# Patient Record
Sex: Female | Born: 1980
Health system: Southern US, Community
[De-identification: ages and names within clinical notes are randomized; demographics above are authoritative.]

## PROBLEM LIST (undated history)

## (undated) ENCOUNTER — Inpatient Hospital Stay (HOSPITAL_COMMUNITY): Payer: Self-pay

## (undated) DIAGNOSIS — R51 Headache: Secondary | ICD-10-CM

## (undated) DIAGNOSIS — R519 Headache, unspecified: Secondary | ICD-10-CM

## (undated) DIAGNOSIS — U071 COVID-19: Secondary | ICD-10-CM

## (undated) DIAGNOSIS — G4489 Other headache syndrome: Secondary | ICD-10-CM

## (undated) DIAGNOSIS — E119 Type 2 diabetes mellitus without complications: Secondary | ICD-10-CM

## (undated) DIAGNOSIS — J1282 Pneumonia due to coronavirus disease 2019: Secondary | ICD-10-CM

## (undated) DIAGNOSIS — G5603 Carpal tunnel syndrome, bilateral upper limbs: Secondary | ICD-10-CM

## (undated) HISTORY — PX: NO PAST SURGERIES: SHX2092

---
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
## 2002-11-06 ENCOUNTER — Inpatient Hospital Stay (HOSPITAL_COMMUNITY): Admission: AD | Admit: 2002-11-06 | Discharge: 2002-11-07 | Payer: Self-pay | Admitting: Obstetrics and Gynecology

## 2002-11-07 ENCOUNTER — Encounter: Payer: Self-pay | Admitting: Obstetrics and Gynecology

## 2002-11-07 ENCOUNTER — Encounter (INDEPENDENT_AMBULATORY_CARE_PROVIDER_SITE_OTHER): Payer: Self-pay | Admitting: Specialist

## 2003-03-02 ENCOUNTER — Encounter: Admission: RE | Admit: 2003-03-02 | Discharge: 2003-03-02 | Payer: Self-pay | Admitting: Obstetrics and Gynecology

## 2003-03-02 ENCOUNTER — Other Ambulatory Visit: Admission: RE | Admit: 2003-03-02 | Discharge: 2003-03-02 | Payer: Self-pay | Admitting: Family Medicine

## 2003-04-28 ENCOUNTER — Other Ambulatory Visit: Admission: RE | Admit: 2003-04-28 | Discharge: 2003-04-28 | Payer: Self-pay | Admitting: Family Medicine

## 2003-04-28 ENCOUNTER — Encounter: Admission: RE | Admit: 2003-04-28 | Discharge: 2003-04-28 | Payer: Self-pay | Admitting: Family Medicine

## 2003-04-28 ENCOUNTER — Encounter (INDEPENDENT_AMBULATORY_CARE_PROVIDER_SITE_OTHER): Payer: Self-pay

## 2003-05-12 ENCOUNTER — Encounter: Admission: RE | Admit: 2003-05-12 | Discharge: 2003-05-12 | Payer: Self-pay | Admitting: Family Medicine

## 2004-03-01 ENCOUNTER — Inpatient Hospital Stay (HOSPITAL_COMMUNITY): Admission: AD | Admit: 2004-03-01 | Discharge: 2004-03-05 | Payer: Self-pay | Admitting: Obstetrics

## 2004-07-26 ENCOUNTER — Inpatient Hospital Stay (HOSPITAL_COMMUNITY): Admission: AD | Admit: 2004-07-26 | Discharge: 2004-07-26 | Payer: Self-pay | Admitting: Obstetrics

## 2004-08-20 ENCOUNTER — Inpatient Hospital Stay (HOSPITAL_COMMUNITY): Admission: AD | Admit: 2004-08-20 | Discharge: 2004-08-22 | Payer: Self-pay | Admitting: Obstetrics

## 2005-09-26 ENCOUNTER — Ambulatory Visit: Payer: Self-pay | Admitting: Family Medicine

## 2006-02-10 ENCOUNTER — Inpatient Hospital Stay (HOSPITAL_COMMUNITY): Admission: AD | Admit: 2006-02-10 | Discharge: 2006-02-12 | Payer: Self-pay | Admitting: Obstetrics & Gynecology

## 2006-03-31 ENCOUNTER — Inpatient Hospital Stay (HOSPITAL_COMMUNITY): Admission: AD | Admit: 2006-03-31 | Discharge: 2006-04-03 | Payer: Self-pay | Admitting: Obstetrics

## 2006-05-16 ENCOUNTER — Inpatient Hospital Stay (HOSPITAL_COMMUNITY): Admission: AD | Admit: 2006-05-16 | Discharge: 2006-05-18 | Payer: Self-pay | Admitting: Obstetrics

## 2006-07-03 ENCOUNTER — Ambulatory Visit: Payer: Self-pay | Admitting: Family Medicine

## 2006-08-25 ENCOUNTER — Ambulatory Visit: Payer: Self-pay | Admitting: Family Medicine

## 2006-08-26 ENCOUNTER — Ambulatory Visit: Payer: Self-pay | Admitting: *Deleted

## 2006-08-28 ENCOUNTER — Ambulatory Visit (HOSPITAL_COMMUNITY): Admission: RE | Admit: 2006-08-28 | Discharge: 2006-08-28 | Payer: Self-pay | Admitting: Family Medicine

## 2006-10-02 ENCOUNTER — Ambulatory Visit: Payer: Self-pay | Admitting: Family Medicine

## 2007-09-01 ENCOUNTER — Encounter (INDEPENDENT_AMBULATORY_CARE_PROVIDER_SITE_OTHER): Payer: Self-pay | Admitting: *Deleted

## 2009-08-14 ENCOUNTER — Inpatient Hospital Stay (HOSPITAL_COMMUNITY): Admission: AD | Admit: 2009-08-14 | Discharge: 2009-08-16 | Payer: Self-pay | Admitting: Obstetrics

## 2011-03-21 LAB — CBC
MCHC: 32.1 g/dL (ref 30.0–36.0)
MCV: 74.8 fL — ABNORMAL LOW (ref 78.0–100.0)
Platelets: 212 10*3/uL (ref 150–400)
RDW: 18.9 % — ABNORMAL HIGH (ref 11.5–15.5)

## 2011-03-22 LAB — CBC
Platelets: 268 10*3/uL (ref 150–400)
WBC: 10.9 10*3/uL — ABNORMAL HIGH (ref 4.0–10.5)

## 2011-03-22 LAB — RPR: RPR Ser Ql: NONREACTIVE

## 2011-05-02 NOTE — Discharge Summary (Signed)
[name], [name]                 ACCOUNT NO.:  [id number]   MEDICAL RECORD NO.:  [id number]                   PATIENT TYPE:  INP   LOCATION:  9305                                 [facility]:  [facility]   PHYSICIAN:  Charles A. Clearance Coots, M.D.             DATE OF BIRTH:  [birth year]   DATE OF ADMISSION:  03/01/2004  DATE OF DISCHARGE:  03/05/2004                                 DISCHARGE SUMMARY   ADMITTING DIAGNOSIS:  Eleven weeks gestation, probable viral syndrome.   DISCHARGE DIAGNOSIS:  Eleven weeks gestation, probable viral syndrome,  status post bedrest and intravenous fluids and supportive management.  Discharged home much improved at [redacted] weeks gestation undelivered in good  condition.   REASON FOR ADMISSION:  A 30 year old Hispanic female G2 P0 at [redacted] weeks  gestation presents with nausea and vomiting for 2 days, could not keep  anything down.  She denied fever, chills, and dysuria.  The patient did have  some lower abdominal cramping.   PAST MEDICAL HISTORY:  Surgery:  None.  Illnesses:  None.   MEDICATIONS:  None.   ALLERGIES:  No known drug allergies.   SOCIAL HISTORY:  Negative for tobacco, alcohol, or recreational drug use.   PHYSICAL EXAMINATION:  GENERAL:  Slim Hispanic female in no acute distress.  VITAL SIGNS:  Temperature 100.8, pulse 98, respiratory rate 20, blood  pressure 108/62.  HEENT:  Normal.  LUNGS:  Clear to auscultation bilaterally.  HEART:  Regular rate and rhythm.  ABDOMEN:  Soft.  Positive suprapubic tenderness.  PELVIC:  Normal external female genitalia.  Cervix long, closed.  Positive  cervical motion tenderness.  Fetal heart rate with Doppler was 180 beats per  minute.   Urinalysis revealed greater than 80 ketones and specific gravity is 1.025.  Urine culture was sent.  Comprehensive metabolic panel was within normal  limits except for slightly elevated AST of 60.   HOSPITAL COURSE:  The patient was admitted, started on IV fluids and  antiemetic therapy.  She was also started on IV antibiotic therapy.  Urine  culture was sent and urine grew out 45,000 colonies of E. coli.  The patient  responded well to therapy and by hospital day #3 was feeling much better  with much less nausea and was able to begin to tolerate regular diet.  She  was tolerating regular diet quite well by hospital day #4 and had no  significant nausea, and was therefore discharged home.   DISCHARGE LABORATORY VALUES:  Urine culture 45,000 E. coli, sensitivity  pending.  Clinically responded quite well to Rocephin.   DISCHARGE DISPOSITION:  1. Medications:  Continue prenatal vitamins as tolerated.  2. Routine written instructions were given for undelivered obstetrical     patient.  3. The patient is to call the office for a follow-up appointment in 1 week.  Charles A. Clearance Coots, M.D.    CAH/MEDQ  D:  03/05/2004  T:  03/06/2004  Job:  [id number]

## 2011-05-02 NOTE — Discharge Summary (Signed)
[name], [name]             ACCOUNT NO.:  [id number]   MEDICAL RECORD NO.:  [id number]          PATIENT TYPE:  INP   LOCATION:  9158                          [facility]   PHYSICIAN:  Charles A. Clearance Coots, M.D.DATE OF BIRTH:  Apr 02, 1981   DATE OF ADMISSION:  03/31/2006  DATE OF DISCHARGE:  04/03/2006                                 DISCHARGE SUMMARY   ADMISSION DIAGNOSIS:  Thirty-two weeks gestation, preterm uterine  contractions.   DISCHARGE DIAGNOSES:  1.  Thirty-two weeks gestation, preterm uterine contractions improved  after intravenous magnesium sulfate tocolysis.  1.  Discharged undelivered at 32-weeks gestation in good condition.   REASON FOR ADMISSION:  A 30 year old Hispanic female G3, P2, estimated date  of confinement of May 26, 2006, presents with back pain and cramping. The  patient has a history of previous term and a previous preterm delivery.  Prenatal care was uncomplicated with this pregnancy.   PAST MEDICAL HISTORY:   PAST SURGICAL HISTORY:  None.   ILLNESSES:  None.   MEDICATIONS:  Prenatal vitamins.   ALLERGIES:  No known drug allergies.   SOCIAL HISTORY:  Married. Negative tobacco, alcohol or recreational drug  use.   PHYSICAL EXAMINATION:  Afebrile, vital signs were stable. Abdomen gravid,  nontender. Cervix 3 cm, 90% effaced and vertex at a  -2 station. External fetal monitor revealed uterine contractions every 3-7  minutes, mild.   ADMITTING LABORATORY VALUES:  Urinalysis was within normal limits except for  a specific gravity of greater than 1.030, 40 ketones.   HOSPITAL COURSE:  The patient was started on magnesium sulfate  intravenously. Responded well to therapy and was having no significant  uterine contractions after 24 hours. Magnesium sulfate was discontinued  after 48 hours and she was discharged home the following morning much  improved, undelivered at [redacted] weeks gestation in good condition.   DISCHARGE MEDICATIONS:  Continue  prenatal vitamins.   DISPOSITION:  Routine written instructions were given for discharge after  admission for preterm labor. The patient is to call the office for a follow  up appointment in 29 week.      Charles A. Clearance Coots, M.D.  Electronically Signed     CAH/MEDQ  D:  04/03/2006  T:  04/03/2006  Job:  [id number]
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
## 2011-05-02 NOTE — Discharge Summary (Signed)
[name], [name]                 ACCOUNT NO.:  [id number]   MEDICAL RECORD NO.:  [id number]                   PATIENT TYPE:  INP   LOCATION:  9157                                 [facility]   PHYSICIAN:  Zenaida Niece, M.D.             DATE OF BIRTH:  1981/05/07   DATE OF ADMISSION:  11/06/2002  DATE OF DISCHARGE:  11/07/2002                                 DISCHARGE SUMMARY   ADMISSION DIAGNOSIS:  Intrauterine pregnancy at 19-20 weeks with advanced  preterm labor.   DISCHARGE DIAGNOSIS:  Intrauterine pregnancy at 19-20 weeks with advanced  preterm labor.   PROCEDURE:  On November 07, 2002, she had a spontaneous vaginal delivery.   COMPLICATIONS:  Fetal death.   HISTORY OF PRESENT ILLNESS:  This is a 30 year old Hispanic female gravida  1, para 0 with an EGA of 19-20 weeks by an LMP of June 19, 2002, who  presented to [facility] ED with the complaint of bleeding and pain.  Evaluation there revealed a significant bulging bag of water with stable  vitals and she was transferred to [facility] for evaluation.  She speaks  Spanish with very little [place]. She had no prenatal care but did know she  was pregnant and had no problems up until now.  She denies any significant  past medical history.   PHYSICAL EXAMINATION:  VITAL SIGNS:  She is afebrile with stable vital  signs.  She did have fetal heart rate of 180 at [facility] and by the  monitor she does have contractions every three to four minutes.  ABDOMEN:  The abdomen has a fundus at the umbilicus which is slightly  tender.  PELVIC:  Speculum reveals some blood and a bulging bag of water.  Gonorrhea  and Chlamydia cultures are done.  On bimanual exam, there is no palpable  cervix with a bulging bag of water with a questionable presenting part.   LABORATORY DATA:  Admission labs at [facility] revealed a white count of 15.9,  hemoglobin 13.2, platelets 253,000.  A CMP revealed a low potassium of 2.6  and slightly  elevated LFTs with an SGOT of 38 and SGPT of 49.   HOSPITAL COURSE:  The patient was admitted and had an ultrasound performed  which confirmed an estimated gestational age of around 20 weeks and an  estimated fetal weight of 300 g.  The situation was discussed with the  patient via an interpreter and we agreed to proceed with delivery.  Amniotomy was performed at 0120 on November 07, 2002.  She progressed to  complete and delivered at a fetus at approximately 0218 on November 07, 2002.  This was a female with Apgars of 1 and 1 and had a heart beat for  approximately 11 minutes.  The patient did hold the baby.  The baby appeared  grossly normal, and was sent to the morgue.  The patient declined autopsy.  The placenta and remaining cord delivered at  approximately 0335 with no  significant bleeding and no lacerations.  She was monitored for the next  several hours and remained afebrile with stable vital signs.  Repeat labs  revealed a normal CMP with normal potassium and normal liver enzymes.  The  CBC revealed a white count up to 18 and hemoglobin of 11.2.  She was felt to  be stable enough for discharge home to be followed up in two weeks.   DISCHARGE INSTRUCTIONS:  Pelvic rest.  Follow up in two weeks.  Call for any  significant problems.                                               Zenaida Niece, M.D.   TDM/MEDQ  D:  11/07/2002  T:  11/07/2002  Job:  [id number]

## 2011-10-24 ENCOUNTER — Emergency Department (HOSPITAL_COMMUNITY)
Admission: EM | Admit: 2011-10-24 | Discharge: 2011-10-24 | Disposition: A | Discharge status: Home / Self Care | Payer: Self-pay | Attending: Emergency Medicine | Admitting: Emergency Medicine

## 2011-10-24 ENCOUNTER — Encounter: Payer: Self-pay | Admitting: Emergency Medicine

## 2011-10-24 DIAGNOSIS — K089 Disorder of teeth and supporting structures, unspecified: Secondary | ICD-10-CM | POA: Insufficient documentation

## 2011-10-24 DIAGNOSIS — K029 Dental caries, unspecified: Secondary | ICD-10-CM | POA: Insufficient documentation

## 2011-10-24 DIAGNOSIS — H5789 Other specified disorders of eye and adnexa: Secondary | ICD-10-CM | POA: Insufficient documentation

## 2011-10-24 DIAGNOSIS — K1370 Unspecified lesions of oral mucosa: Secondary | ICD-10-CM | POA: Insufficient documentation

## 2011-10-24 MED ORDER — PENICILLIN V POTASSIUM 500 MG PO TABS: 500.0000 mg | ORAL_TABLET | Freq: Four times a day (QID) | ORAL | Status: AC

## 2011-10-24 MED ORDER — HYDROCODONE-ACETAMINOPHEN 5-500 MG PO TABS: 2.0000 | ORAL_TABLET | Freq: Four times a day (QID) | ORAL | Status: AC | PRN

## 2011-10-24 NOTE — ED Notes (Signed)
Patient with left lower molar pain.

## 2011-10-24 NOTE — ED Provider Notes (Addendum)
History     CSN: [id number] Arrival date & time: 10/24/2011  1:57 AM   First MD Initiated Contact with Patient 10/24/11 0220      Chief Complaint  Patient presents with  . Dental Pain    (Consider location/radiation/quality/duration/timing/severity/associated sxs/prior treatment) Patient is a 30 y.o. female presenting with tooth pain. The history is provided by the patient. No language interpreter was used.  Dental PainThe primary symptoms include oral lesions. Primary symptoms do not include mouth pain, dental injury, oral bleeding, headaches, fever, shortness of breath, sore throat, angioedema or cough. The symptoms began 3 to 5 days ago. The symptoms are worsening. The symptoms are recurrent. The symptoms occur constantly.  Additional symptoms include: dental sensitivity to temperature. Additional symptoms do not include: gum swelling, gum tenderness, purulent gums, trismus, jaw pain, facial swelling, trouble swallowing, pain with swallowing, excessive salivation, dry mouth, taste disturbance, smell disturbance, drooling, ear pain, hearing loss, nosebleeds, swollen glands, goiter and fatigue. Medical issues do not include: alcohol problem, chewing tobacco and periodontal disease.    History reviewed. No pertinent past medical history.  History reviewed. No pertinent past surgical history.  No family history on file.  History  Substance Use Topics  . Smoking status: Current Everyday Smoker  . Smokeless tobacco: Not on file  . Alcohol Use: No    OB History    Grav Para Term Preterm Abortions TAB SAB Ect Mult Living                  Review of Systems  Constitutional: Negative for fever and fatigue.  HENT: Negative for hearing loss, ear pain, nosebleeds, sore throat, facial swelling, drooling and trouble swallowing.   Eyes: Positive for discharge.  Respiratory: Negative for cough and shortness of breath.   Cardiovascular: Negative.   Gastrointestinal: Negative.     Genitourinary: Negative for difficulty urinating.  Musculoskeletal: Negative.   Neurological: Negative for headaches.  Hematological: Negative.   Psychiatric/Behavioral: Negative.     Allergies  Review of patient's allergies indicates no known allergies.  Home Medications  No current outpatient prescriptions on file.  BP 126/82  Pulse 85  Temp(Src) 99.3 F (37.4 C) (Oral)  Resp 18  SpO2 100%  Physical Exam  Constitutional: She is oriented to person, place, and time. She appears well-developed and well-nourished. No distress.  HENT:  Head: Normocephalic and atraumatic.  Mouth/Throat: Oropharynx is clear and moist.       Widespread dental decay erosion of the RL first molar filling  Eyes: EOM are normal. Right eye exhibits no discharge. Left eye exhibits no discharge.  Neck: Neck supple.  Cardiovascular: Normal rate and regular rhythm.   Pulmonary/Chest: Effort normal and breath sounds normal.  Abdominal: Soft. Bowel sounds are normal.  Neurological: She is alert and oriented to person, place, and time.  Skin: Skin is warm and dry.  Psychiatric: She has a normal mood and affect.    ED Course  Procedures (including critical care time)  Labs Reviewed - No data to display No results found.   No diagnosis found.    MDM  Patient instructed to use condoms while on antibiotics and for a full cycle thereafter to prevent pregancny as penicillins can decreased OCP effectiveness.  Patient verbalizes understanding        Talayla Doyel K Nishaan Stanke-Rasch, MD 10/24/11 0241  Xara Paulding K Elexia Friedt-Rasch, MD 10/24/11 [phone]

## 2011-10-24 NOTE — ED Notes (Signed)
PT. REPORTS RIGHT LOWER MOLAR PAIN RADIATING TO RIGHT EAR AND FACE x4 DAYS.

## 2013-08-23 ENCOUNTER — Ambulatory Visit: Payer: Self-pay

## 2013-08-25 ENCOUNTER — Ambulatory Visit: Payer: Self-pay | Attending: Internal Medicine

## 2013-08-31 ENCOUNTER — Ambulatory Visit: Payer: Self-pay

## 2014-05-03 ENCOUNTER — Ambulatory Visit: Payer: Self-pay

## 2014-05-11 DIAGNOSIS — D259 Leiomyoma of uterus, unspecified: Secondary | ICD-10-CM

## 2014-05-11 NOTE — Progress Notes (Signed)
Patient referred to [place] for fibroids and bleeding. Per Dr. Harolyn Rutherford patient needs ultrasound and clinic appointment to discuss bleeding. U/S scheduled for 05/19/14 at 1015. Called patient with interpreter Raquel. Informed patient of the need for U/S prior to appointment as well as appointment date, time and location. Advised patient she should arrive 15 minutes early and have a full bladder. Number to radiology scheduling given [phone] incase patient needs to reschedule. Informed patient she will be hearing from our front office staff about an appointment here in the clinic. Patient verbalized understanding. No questions or concerns.

## 2014-05-19 ENCOUNTER — Ambulatory Visit (HOSPITAL_COMMUNITY)
Admission: RE | Admit: 2014-05-19 | Discharge: 2014-05-19 | Disposition: A | Discharge status: Home / Self Care | Payer: Self-pay | Source: Ambulatory Visit | Attending: Obstetrics & Gynecology | Admitting: Obstetrics & Gynecology

## 2014-05-19 DIAGNOSIS — D259 Leiomyoma of uterus, unspecified: Secondary | ICD-10-CM

## 2014-05-19 DIAGNOSIS — N946 Dysmenorrhea, unspecified: Secondary | ICD-10-CM | POA: Insufficient documentation

## 2014-05-19 DIAGNOSIS — D251 Intramural leiomyoma of uterus: Secondary | ICD-10-CM | POA: Insufficient documentation

## 2014-05-24 ENCOUNTER — Telehealth: Payer: Self-pay

## 2014-05-24 NOTE — Telephone Encounter (Signed)
Message copied by Geanie Logan on Wed May 24, 2014 11:54 AM ------      Message from: Verita Schneiders A      Created: Mon May 22, 2014  3:28 PM       Ultrasound shows very small fibroid.  Will discuss management of her pain during her visit. Speaks Spanish only.  Please call to inform patient of results and recommendations.       ------

## 2014-05-24 NOTE — Telephone Encounter (Signed)
Called patient with pacific interpreter [phone]. Called home phone-- busy signal. Called mobile number-- was told by interpreter the system froze-- unable to dial out again. Unable to leave message.

## 2014-05-25 NOTE — Telephone Encounter (Signed)
Called patient and informed of results. Appointment has not been scheduled--- informed patient she will be hearing from front office staff with appointment within the next few days. Patient verbalized understanding. No questions or concerns.

## 2014-07-14 ENCOUNTER — Ambulatory Visit (INDEPENDENT_AMBULATORY_CARE_PROVIDER_SITE_OTHER): Payer: Self-pay | Admitting: Nurse Practitioner

## 2014-07-14 ENCOUNTER — Encounter: Payer: Self-pay | Admitting: Nurse Practitioner

## 2014-07-14 VITALS — BP 125/64 | HR 80 | Wt 139.5 lb

## 2014-07-14 DIAGNOSIS — N949 Unspecified condition associated with female genital organs and menstrual cycle: Secondary | ICD-10-CM

## 2014-07-14 DIAGNOSIS — N938 Other specified abnormal uterine and vaginal bleeding: Secondary | ICD-10-CM

## 2014-07-14 DIAGNOSIS — D259 Leiomyoma of uterus, unspecified: Secondary | ICD-10-CM

## 2014-07-14 MED ORDER — FLUCONAZOLE 150 MG PO TABS: 150.0000 mg | ORAL_TABLET | Freq: Every day | ORAL | Status: DC

## 2014-07-14 MED ORDER — NORGESTIMATE-ETH ESTRADIOL 0.25-35 MG-MCG PO TABS: 1.0000 | ORAL_TABLET | Freq: Every day | ORAL | Status: DC

## 2014-07-14 NOTE — Progress Notes (Signed)
History:  [name] 33 y.o. G4P3  who presents to clinic today for vaginal bleeding that has now stopped. She was bleeding from January to March and was put on Depo Provera. A pelvic ultrasound was ordered prior to being seen that showed 2 small fibroids. She is Spanish speaking only with an interpreter and the story in somewhat convoluted. It sounds like she is also seen at [facility] and had a negative pap in April. She has also been placed on BCps for 1-2 months that may or may not have helped. She would like to become pregnant one more time. And she thinks she has a yeast infection today.   The following portions of the patient's history were reviewed and updated as appropriate: allergies, current medications, past family history, past medical history, past social history, past surgical history and problem list.  Review of Systems:    Objective:  Physical Exam BP 125/64  Pulse 80  Wt 139 lb 8 oz (63.277 kg)  LMP 07/12/2014 GENERAL: Well-developed, well-nourished female in no acute distress.  HEENT: Normocephalic, atraumatic.  ABDOMEN: Soft, nontender, nondistended. No organomegaly. Normal bowel sounds appreciated in all quadrants.  PELVIC: Normal external female genitalia. Vagina is pink and rugated. . Normal cervix contour Uterus is normal in size. No adnexal mass or tenderness. Small amount old blood only. Wet Prep obtained EXTREMITIES: No cyanosis, clubbing, or edema, 2+ distal pulses.   Labs and Imaging No results found.   Assessment & Plan:  Assessment:  Uterine Fibroids  DUB that seems to be improving Possible vaginal yeast infection  Plans:  Will add back Ortho Cyclen BCP until she wants to become pregnant Diflucan 150 mg/ 1 refill Follow up with [facility] for Depo Provera and yearly pap smears  Olegario Messier, NP 07/14/2014 8:50 AM

## 2014-07-14 NOTE — Patient Instructions (Signed)
Hemorragia uterina disfuncional (Dysfunctional Uterine Bleeding) Normalmente, los perodos menstruales comienzan en las jvenes de entre 11 y 62 aos. Un ciclo o perodo menstrual puede repetirse [employer] [address] y los 60 das y dura [place] 1 y 77 das. [employer] 12 y los 14 das antes de que comience el ciclo menstrual, se produce la ovulacin (los ovarios producen vulos). Cuando cuente los periodos, hgalo desde Engineer, manufacturing systems del comienzo de la hemorragia del perodo anterior Management consultant de la hemorragia del perodo siguiente. La hemorragia uterina disfuncional (anormal) es diferente del perodo menstrual normal. Los perodos pueden comenzar antes o despus que lo habitual. Pueden ser menos abundantes, presentar cogulos, o ser ms abundantes. Puede tener [employer] perodos o IT consultant un perodo o ms. Puede tener hemorragia luego de [employer], despus de la menopausia o faltarle el perodo menstrual. CAUSAS  Embarazo (normal, aborto espontneo, embarazo ectpico).  DIU (dispositivo intrauterino, anticonceptivo)  Pldoras anticonceptivas  Tratamiento hormonal  La menopausia  Infeccin en el cervix.  Problemas de coagulacin.  Infecciones de la superficie interna del tero.  Endometriosis: la superficie interna del tero se desarrolla en la pelvis y otros rganos femeninos.  Adherencias (tejido cicatrizal) dentro del tero.  Obesidad o severa prdida de peso.  Plipos en el tero.  Cncer en el crvix, la vagina o el tero.  Quiste de ovarios o Sndrome ovrico poliqustico.  Otras enfermedades (diabetes, enfermedad de la tiroides, Social research officer, government).  Fibromas en el tero (tumores no cancerosos).  Problemas con las hormonas femeninas.  Hiperplasia del endometrio: capa gruesa y clulas agrandadas dentro del tero.  Medicamentos que interfieren con la ovulacin.  Radiacin en la pelvis o el abdomen.  Quimioterapia. DIAGNSTICO  El mdico  Marketing executive historia de sus perodos [place], los medicamentos que toma, los cambios en el peso corporal, el estrs de la vida diaria y cualquier problema mdico que tenga.  El mdico realizar un examen fsico, incluyendo un examen plvico.  Tambin le solicitar anlisis para completar el diagnstico. Ellos son:  Papanicolau.  Anlisis de [place].  Cultivos para descartar infecciones.  Tomografa computada.  Ultrasonido.  Histeroscopa.  Laparoscopa.  Resonancia magntica.  Histerosalpingografa.  Dilatacin y curetaje.  Biopsia de endometrio. TRATAMIENTO El tratamiento depender de la causa de la hemorragia.. El tratamiento consiste en:  Observacin de los perodos menstruales durante algunos meses.  Prescripcin de medicamentos como:  Antibiticos:  Hormonas.  Pldoras anticonceptivas  Retirar el DIU (dispositivo intrauterino, anticonceptivo).  Ciruga:  Dilatacin y curetaje (raspado y remocin de tejido de la zona interna del tero).  Laparoscopa (examen del interior del abdomen con un tubo con luz).  Ablacin uterina (destruccin de la membrana que cubre el tero con corriente elctrica, rayos lser, congelamiento o calor ).  Histeroscopa (examen del cuello y el tero con un tubo con luz).  Histerectoma (extirpacin del tero). INSTRUCCIONES PARA EL CUIDADO DOMICILIARIO  Si el profesional que la asiste le prescribe medicamentos, tmelos tal como se le indic. No cambie ni reemplace medicamentos sin consultarlo con Writer.  Las hemorragias de larga duracin pueden traen como consecuencia un dficit de hierro. El profesional que lo asiste podr Conservation officer, nature. Esto ayuda a Air cabin crew que el organismo pierde luego de una hemorragia abundante. Tome los medicamentos tal como se le indic.  No tome aspirina o medicamentos que la contengan u desde una semana antes del perodo menstrual ni durante el mismo. La aspirina puede hacer  que la hemorragia empeore.  Si necesita cambiar el apsito o el tampn mas de una vez cada 2 horas, permanezca en cama con los pies elevados y aplique una compresa fra en la zona baja del abdomen. Descanse todo lo que pueda hasta que la hemorragia se detenga.  Consuma alimentos balanceados. Coma alimentos ricos en hierro. Por ejemplo:  Vegetales verdes frescos.  Cereales integrales y cereales y panes con salvado.  Huevos.  Carnes.  Hgado.  No trate de perder peso hasta que la hemorragia anormal se detenga y los niveles de hierro en la sangre vuelvan a la normalidad. No levante pesos de ms de 10 libras (4.5 kg.) ni realice actividades extenuantes mientras tenga la hemorragia.  Durante un par de meses tome nota en un almanaque y [employer] comienzo y el fin de su perodo menstrual y el tipo de sangrado (escaso, [place], [place], gotas, cogulos o falta de perodo). El objetivo es que su mdico evale mejor el problema. SOLICITE ATENCIN MDICA SI:  Debe cambiar el apsito o el tampn ms de una vez cada hora.  Se siente mareada o dbil.  Tiene algn problema que pueda relacionarse con el medicamento que est tomando.  Siente dolor.  Desea retirar su DIU.  Quiere suspender o cambiar sus pldoras anticonceptivas u hormonas.  Tiene algn tipo de hemorragia anormal mencionada anteriormente.  Tiene ms de 19 aos y an no ha tenido su perodo menstrual.  Tiene 60 aos y an tiene Chartered certified accountant.  Tiene alguno de los sntomas mencionados anteriormente.  Aparece una erupcin cutnea. SOLICITE ATENCIN MDICA DE INMEDIATO SI:  La temperatura oral se eleva sin motivo por encima de 38,9 C (102 F).  Comienza a sentir escalofros.  Debe cambiar el apsito o el tampn ms de una vez cada hora.  Siente dolor abdominal.  Pierde el conocimiento, se desmaya. Document Released: 09/10/2005 Document Revised: 08/25/2012 [facility] Patient Information 2015 [place]. This  information is not intended to replace advice given to you by your health care provider. Make sure you discuss any questions you have with your health care provider.

## 2014-07-14 NOTE — Addendum Note (Signed)
Addended by: Novella Olive on: 07/14/2014 10:03 AM   Modules accepted: Orders

## 2014-07-15 LAB — WET PREP, GENITAL
Clue Cells Wet Prep HPF POC: NONE SEEN
Trich, Wet Prep: NONE SEEN
WBC WET PREP: NONE SEEN

## 2014-08-05 ENCOUNTER — Emergency Department (HOSPITAL_COMMUNITY)
Admission: EM | Admit: 2014-08-05 | Discharge: 2014-08-05 | Disposition: A | Discharge status: Home / Self Care | Payer: Self-pay | Attending: Emergency Medicine | Admitting: Emergency Medicine

## 2014-08-05 ENCOUNTER — Encounter (HOSPITAL_COMMUNITY): Payer: Self-pay | Admitting: Emergency Medicine

## 2014-08-05 DIAGNOSIS — Z79899 Other long term (current) drug therapy: Secondary | ICD-10-CM | POA: Insufficient documentation

## 2014-08-05 DIAGNOSIS — Z87891 Personal history of nicotine dependence: Secondary | ICD-10-CM | POA: Insufficient documentation

## 2014-08-05 DIAGNOSIS — R51 Headache: Secondary | ICD-10-CM | POA: Insufficient documentation

## 2014-08-05 DIAGNOSIS — G43809 Other migraine, not intractable, without status migrainosus: Secondary | ICD-10-CM

## 2014-08-05 HISTORY — DX: Headache: R51

## 2014-08-05 HISTORY — DX: Headache, unspecified: R51.9

## 2014-08-05 MED ORDER — SUMATRIPTAN SUCCINATE 25 MG PO TABS: 25.0000 mg | ORAL_TABLET | ORAL | Status: DC | PRN

## 2014-08-05 MED ORDER — OXYCODONE-ACETAMINOPHEN 5-325 MG PO TABS
1.0000 | ORAL_TABLET | Freq: Once | ORAL | Status: AC
  Administered 2014-08-05: 1 via ORAL
  Filled 2014-08-05: qty 1

## 2014-08-05 MED ORDER — ONDANSETRON 4 MG PO TBDP
8.0000 mg | ORAL_TABLET | Freq: Once | ORAL | Status: AC
  Administered 2014-08-05: 8 mg via ORAL
  Filled 2014-08-05: qty 2

## 2014-08-05 MED ORDER — DIPHENHYDRAMINE HCL 50 MG/ML IJ SOLN
12.5000 mg | Freq: Once | INTRAMUSCULAR | Status: AC
  Administered 2014-08-05: 12.5 mg via INTRAVENOUS
  Filled 2014-08-05: qty 1

## 2014-08-05 MED ORDER — PROCHLORPERAZINE EDISYLATE 5 MG/ML IJ SOLN
10.0000 mg | Freq: Four times a day (QID) | INTRAMUSCULAR | Status: DC | PRN
  Administered 2014-08-05: 10 mg via INTRAVENOUS
  Filled 2014-08-05: qty 2

## 2014-08-05 MED ORDER — KETOROLAC TROMETHAMINE 30 MG/ML IJ SOLN
15.0000 mg | Freq: Once | INTRAMUSCULAR | Status: AC
  Administered 2014-08-05: 15 mg via INTRAVENOUS
  Filled 2014-08-05: qty 1

## 2014-08-05 NOTE — ED Notes (Signed)
Ambulated to br without difficulty.

## 2014-08-05 NOTE — ED Notes (Signed)
C/o headache x 3 days with nausea.  Reports history of headaches.

## 2014-08-05 NOTE — ED Provider Notes (Signed)
CSN: [id number]     Arrival date & time 08/05/14  2409 History   First MD Initiated Contact with Patient 08/05/14 0703     Chief Complaint  Patient presents with  . Headache     Patient is a 33 y.o. female presenting with headaches. The history is provided by the patient. The history is limited by a language barrier.  Headache Pain location:  Generalized Quality:  Dull Onset quality:  Gradual Timing:  Constant Progression:  Unchanged Context: bright light and loud noise   Relieved by:  Nothing Associated symptoms: nausea     Past Medical History  Diagnosis Date  . Headache    History reviewed. No pertinent past surgical history. No family history on file. History  Substance Use Topics  . Smoking status: Former Research scientist (life sciences)  . Smokeless tobacco: Not on file  . Alcohol Use: No   OB History   Grav Para Term Preterm Abortions TAB SAB Ect Mult Living                 Review of Systems  Gastrointestinal: Positive for nausea.  Neurological: Positive for headaches.      Allergies  Review of patient's allergies indicates no known allergies.  Home Medications   Prior to Admission medications   Medication Sig Start Date End Date Taking? Authorizing Provider  SUMAtriptan (IMITREX) 25 MG tablet Take 1 tablet (25 mg total) by mouth every 2 (two) hours as needed for migraine or headache. May repeat in 2 hours if headache persists or recurs. No more than 3 tabs in 24 hour period 08/05/14   Dot Lanes, MD   BP 109/68  Pulse 75  Temp(Src) 98.3 F (36.8 C) (Oral)  Resp 18  Ht 5\' 1"  (1.549 m)  Wt 140 lb (63.504 kg)  BMI 26.47 kg/m2  SpO2 99%  LMP 04/26/2014 Physical Exam Physical Exam  Nursing note and vitals reviewed. Constitutional: She is oriented to person, place, and time. She appears well-developed and well-nourished. No distress.  HENT:  Head: Normocephalic and atraumatic.  Eyes: Pupils are equal, round, and reactive to light.  Neck: Normal range of motion.   Cardiovascular: Normal rate and intact distal pulses.   Pulmonary/Chest: No respiratory distress.  Abdominal: Normal appearance. She exhibits no distension.  Musculoskeletal: Normal range of motion.  Neurological: She is alert and oriented to person, place, and time. No cranial nerve deficit.  no focal or lateralizing weakness.  Gait is normal. Skin: Skin is warm and dry. No rash noted.  Psychiatric: She has a normal mood and affect. Her behavior is normal.   ED Course  Procedures (including critical care time) Medications  prochlorperazine (COMPAZINE) injection 10 mg (10 mg Intravenous Given 08/05/14 0749)  oxyCODONE-acetaminophen (PERCOCET/ROXICET) 5-325 MG per tablet 1 tablet (1 tablet Oral Given 08/05/14 0335)  ondansetron (ZOFRAN-ODT) disintegrating tablet 8 mg (8 mg Oral Given 08/05/14 0335)  diphenhydrAMINE (BENADRYL) injection 12.5 mg (12.5 mg Intravenous Given 08/05/14 0749)  ketorolac (TORADOL) 30 MG/ML injection 15 mg (15 mg Intravenous Given 08/05/14 0850)  diphenhydrAMINE (BENADRYL) injection 12.5 mg (12.5 mg Intravenous Given 08/05/14 0854)   After treatment in the ED the patient feels back to baseline and wants to go home. Labs Review Labs Reviewed - No data to display  Imaging Review No results found.    MDM   Final diagnoses:  Other migraine without status migrainosus, not intractable        Dot Lanes, MD 08/05/14 1013

## 2014-08-05 NOTE — Discharge Instructions (Signed)
Cefalea migraosa (Migraine Headache) Una cefalea migraosa es un dolor muy intenso y punzante en uno o ambos lados de la cabeza. Hable con su mdico [employer] factores que pueden causar Medical illustrator) las Psychologist, occupational. Parole solo los [employer] segn le haya indicado el mdico.  Cuando tenga la migraa, acustese en un cuarto oscuro y tranquilo  Lleve un registro diario para averiguar si hay ciertas cosas que le provocan la cefalea migraosa. Por ejemplo, escriba:  Lo que usted come y bebe.  Cunto tiempo duerme.  Algn cambio en su dieta o en los medicamentos.  Beba menos alcohol.  Si fuma, deje de hacerlo.  Duerma lo suficiente.  Disminuya todo tipo de estrs de la vida diaria.  [place] luces tenues si le [employer] luces brillantes o hacen que la [place]. SOLICITE AYUDA DE INMEDIATO SI:   La migraa empeora.  Tiene fiebre.  Presenta rigidez en el cuello.  Tiene dificultad para ver.  Sus msculos estn dbiles, o pierde el control muscular.  Pierde el equilibrio o tiene problemas para Writer.  Siente que se desvanece (debilidad) o se desmaya.  Tiene malos sntomas que son diferentes a los primeros sntomas. ASEGRESE DE QUE:   Comprende estas instrucciones.  Controlar su afeccin.  Recibir ayuda de inmediato si no mejora o si empeora. Document Released: 02/27/2009 Document Revised: 12/06/2013 [facility] Patient Information 2015 [place]. This information is not intended to replace advice given to you by your health care provider. Make sure you discuss any questions you have with your health care provider.

## 2014-08-10 ENCOUNTER — Encounter (HOSPITAL_COMMUNITY): Payer: Self-pay | Admitting: Emergency Medicine

## 2014-08-10 ENCOUNTER — Emergency Department (HOSPITAL_COMMUNITY): Payer: Medicaid Other

## 2014-08-10 ENCOUNTER — Inpatient Hospital Stay (HOSPITAL_COMMUNITY)
Admission: EM | Admit: 2014-08-10 | Discharge: 2014-08-14 | DRG: 872 | Disposition: A | Discharge status: Home / Self Care | Payer: Medicaid Other | Attending: Internal Medicine | Admitting: Internal Medicine

## 2014-08-10 DIAGNOSIS — N12 Tubulo-interstitial nephritis, not specified as acute or chronic: Secondary | ICD-10-CM | POA: Diagnosis present

## 2014-08-10 DIAGNOSIS — E860 Dehydration: Secondary | ICD-10-CM | POA: Diagnosis present

## 2014-08-10 DIAGNOSIS — E872 Acidosis, unspecified: Secondary | ICD-10-CM

## 2014-08-10 DIAGNOSIS — N10 Acute tubulo-interstitial nephritis: Secondary | ICD-10-CM | POA: Diagnosis present

## 2014-08-10 DIAGNOSIS — R112 Nausea with vomiting, unspecified: Secondary | ICD-10-CM | POA: Diagnosis present

## 2014-08-10 DIAGNOSIS — R6510 Systemic inflammatory response syndrome (SIRS) of non-infectious origin without acute organ dysfunction: Secondary | ICD-10-CM

## 2014-08-10 DIAGNOSIS — R7309 Other abnormal glucose: Secondary | ICD-10-CM

## 2014-08-10 DIAGNOSIS — N134 Hydroureter: Secondary | ICD-10-CM

## 2014-08-10 DIAGNOSIS — N1330 Unspecified hydronephrosis: Secondary | ICD-10-CM | POA: Diagnosis not present

## 2014-08-10 DIAGNOSIS — E876 Hypokalemia: Secondary | ICD-10-CM | POA: Diagnosis present

## 2014-08-10 DIAGNOSIS — A419 Sepsis, unspecified organism: Secondary | ICD-10-CM | POA: Diagnosis not present

## 2014-08-10 DIAGNOSIS — D259 Leiomyoma of uterus, unspecified: Secondary | ICD-10-CM

## 2014-08-10 DIAGNOSIS — R739 Hyperglycemia, unspecified: Secondary | ICD-10-CM | POA: Diagnosis present

## 2014-08-10 DIAGNOSIS — Z87891 Personal history of nicotine dependence: Secondary | ICD-10-CM

## 2014-08-10 DIAGNOSIS — E119 Type 2 diabetes mellitus without complications: Secondary | ICD-10-CM | POA: Diagnosis present

## 2014-08-10 DIAGNOSIS — N938 Other specified abnormal uterine and vaginal bleeding: Secondary | ICD-10-CM

## 2014-08-10 LAB — URINE MICROSCOPIC-ADD ON

## 2014-08-10 LAB — URINALYSIS, ROUTINE W REFLEX MICROSCOPIC
BILIRUBIN URINE: NEGATIVE
KETONES UR: 15 mg/dL — AB
Leukocytes, UA: NEGATIVE
Nitrite: NEGATIVE
PH: 7 (ref 5.0–8.0)
PROTEIN: 100 mg/dL — AB
Specific Gravity, Urine: 1.046 — ABNORMAL HIGH (ref 1.005–1.030)
Urobilinogen, UA: >8 mg/dL — ABNORMAL HIGH (ref 0.0–1.0)

## 2014-08-10 LAB — COMPREHENSIVE METABOLIC PANEL
ALBUMIN: 3.3 g/dL — AB (ref 3.5–5.2)
ALK PHOS: 187 U/L — AB (ref 39–117)
ALT: 38 U/L — ABNORMAL HIGH (ref 0–35)
ALT: 36 U/L — AB (ref 0–35)
ANION GAP: 20 — AB (ref 5–15)
ANION GAP: 19 — AB (ref 5–15)
AST: 55 U/L — ABNORMAL HIGH (ref 0–37)
AST: 45 U/L — ABNORMAL HIGH (ref 0–37)
Albumin: 2.8 g/dL — ABNORMAL LOW (ref 3.5–5.2)
Alkaline Phosphatase: 165 U/L — ABNORMAL HIGH (ref 39–117)
BILIRUBIN TOTAL: 0.7 mg/dL (ref 0.3–1.2)
BUN: 7 mg/dL (ref 6–23)
BUN: 7 mg/dL (ref 6–23)
CALCIUM: 8.4 mg/dL (ref 8.4–10.5)
CHLORIDE: 98 meq/L (ref 96–112)
CO2: 17 meq/L — AB (ref 19–32)
CO2: 19 meq/L (ref 19–32)
Calcium: 9.2 mg/dL (ref 8.4–10.5)
Chloride: 103 mEq/L (ref 96–112)
Creatinine, Ser: 0.51 mg/dL (ref 0.50–1.10)
Creatinine, Ser: 0.56 mg/dL (ref 0.50–1.10)
GFR calc Af Amer: >90 mL/min (ref >90)
GLUCOSE: 246 mg/dL — AB (ref 70–99)
Glucose, Bld: 337 mg/dL — ABNORMAL HIGH (ref 70–99)
POTASSIUM: 3.9 meq/L (ref 3.7–5.3)
Potassium: 2.8 mEq/L — CL (ref 3.7–5.3)
SODIUM: 141 meq/L (ref 137–147)
Sodium: 135 mEq/L — ABNORMAL LOW (ref 137–147)
TOTAL PROTEIN: 7.2 g/dL (ref 6.0–8.3)
Total Bilirubin: 0.8 mg/dL (ref 0.3–1.2)
Total Protein: 8.3 g/dL (ref 6.0–8.3)

## 2014-08-10 LAB — CBC
HEMATOCRIT: 42.6 % (ref 36.0–46.0)
HEMOGLOBIN: 15 g/dL (ref 12.0–15.0)
MCH: 30.4 pg (ref 26.0–34.0)
MCHC: 35.2 g/dL (ref 30.0–36.0)
MCV: 86.4 fL (ref 78.0–100.0)
Platelets: 251 10*3/uL (ref 150–400)
RBC: 4.93 MIL/uL (ref 3.87–5.11)
RDW: 12.6 % (ref 11.5–15.5)
WBC: 11.8 10*3/uL — AB (ref 4.0–10.5)

## 2014-08-10 LAB — BASIC METABOLIC PANEL
ANION GAP: 19 — AB (ref 5–15)
BUN: 6 mg/dL (ref 6–23)
CHLORIDE: 100 meq/L (ref 96–112)
CO2: 19 mEq/L (ref 19–32)
Calcium: 8.6 mg/dL (ref 8.4–10.5)
Creatinine, Ser: 0.46 mg/dL — ABNORMAL LOW (ref 0.50–1.10)
GFR calc non Af Amer: >90 mL/min (ref >90)
Glucose, Bld: 228 mg/dL — ABNORMAL HIGH (ref 70–99)
POTASSIUM: 3.2 meq/L — AB (ref 3.7–5.3)
SODIUM: 138 meq/L (ref 137–147)

## 2014-08-10 LAB — HEMOGLOBIN A1C
HEMOGLOBIN A1C: 11.6 % — AB (ref <5.7)
MEAN PLASMA GLUCOSE: 286 mg/dL — AB (ref <117)

## 2014-08-10 LAB — LIPASE, BLOOD: LIPASE: 12 U/L (ref 11–59)

## 2014-08-10 LAB — CBG MONITORING, ED: Glucose-Capillary: 217 mg/dL — ABNORMAL HIGH (ref 70–99)

## 2014-08-10 LAB — POC URINE PREG, ED: Preg Test, Ur: NEGATIVE

## 2014-08-10 LAB — MAGNESIUM: Magnesium: 1.8 mg/dL (ref 1.5–2.5)

## 2014-08-10 LAB — GLUCOSE, CAPILLARY: GLUCOSE-CAPILLARY: 210 mg/dL — AB (ref 70–99)

## 2014-08-10 MED ORDER — MORPHINE SULFATE 4 MG/ML IJ SOLN
4.0000 mg | Freq: Once | INTRAMUSCULAR | Status: AC
  Administered 2014-08-10: 4 mg via INTRAVENOUS
  Filled 2014-08-10: qty 1

## 2014-08-10 MED ORDER — POTASSIUM CHLORIDE CRYS ER 20 MEQ PO TBCR
40.0000 meq | EXTENDED_RELEASE_TABLET | Freq: Once | ORAL | Status: AC
  Administered 2014-08-10: 40 meq via ORAL
  Filled 2014-08-10: qty 2

## 2014-08-10 MED ORDER — ENOXAPARIN SODIUM 40 MG/0.4ML ~~LOC~~ SOLN
40.0000 mg | SUBCUTANEOUS | Status: DC
  Administered 2014-08-10 – 2014-08-13 (×4): 40 mg via SUBCUTANEOUS
  Filled 2014-08-10 (×5): qty 0.4

## 2014-08-10 MED ORDER — ONDANSETRON HCL 4 MG PO TABS: 4.0000 mg | ORAL_TABLET | Freq: Four times a day (QID) | ORAL | Status: DC | PRN

## 2014-08-10 MED ORDER — ONDANSETRON HCL 4 MG/2ML IJ SOLN
4.0000 mg | Freq: Once | INTRAMUSCULAR | Status: AC
  Administered 2014-08-10: 4 mg via INTRAVENOUS
  Filled 2014-08-10: qty 2

## 2014-08-10 MED ORDER — DEXTROSE 5 % IV SOLN
1.0000 g | INTRAVENOUS | Status: DC
  Administered 2014-08-10 – 2014-08-13 (×4): 1 g via INTRAVENOUS
  Filled 2014-08-10 (×5): qty 10

## 2014-08-10 MED ORDER — HYDROCODONE-ACETAMINOPHEN 5-325 MG PO TABS
1.0000 | ORAL_TABLET | ORAL | Status: DC | PRN
  Administered 2014-08-11 (×4): 2 via ORAL
  Administered 2014-08-12: 1 via ORAL
  Administered 2014-08-12: 2 via ORAL
  Administered 2014-08-12: 1 via ORAL
  Filled 2014-08-10: qty 2
  Filled 2014-08-10 (×2): qty 1
  Filled 2014-08-10 (×4): qty 2

## 2014-08-10 MED ORDER — POTASSIUM CHLORIDE 10 MEQ/100ML IV SOLN
10.0000 meq | Freq: Once | INTRAVENOUS | Status: AC
  Administered 2014-08-10: 10 meq via INTRAVENOUS
  Filled 2014-08-10: qty 100

## 2014-08-10 MED ORDER — ACETAMINOPHEN 650 MG RE SUPP: 650.0000 mg | Freq: Four times a day (QID) | RECTAL | Status: DC | PRN

## 2014-08-10 MED ORDER — ONDANSETRON HCL 4 MG/2ML IJ SOLN
4.0000 mg | Freq: Four times a day (QID) | INTRAMUSCULAR | Status: DC | PRN
  Administered 2014-08-11 – 2014-08-12 (×2): 4 mg via INTRAVENOUS
  Filled 2014-08-10 (×2): qty 2

## 2014-08-10 MED ORDER — INSULIN ASPART 100 UNIT/ML ~~LOC~~ SOLN
0.0000 [IU] | Freq: Three times a day (TID) | SUBCUTANEOUS | Status: DC
  Administered 2014-08-11 (×3): 3 [IU] via SUBCUTANEOUS
  Administered 2014-08-12: 2 [IU] via SUBCUTANEOUS
  Administered 2014-08-12: 1 [IU] via SUBCUTANEOUS
  Administered 2014-08-12: 2 [IU] via SUBCUTANEOUS
  Administered 2014-08-13 (×2): 1 [IU] via SUBCUTANEOUS

## 2014-08-10 MED ORDER — ALBUTEROL SULFATE (2.5 MG/3ML) 0.083% IN NEBU: 2.5000 mg | INHALATION_SOLUTION | RESPIRATORY_TRACT | Status: DC | PRN

## 2014-08-10 MED ORDER — HYDROMORPHONE HCL PF 1 MG/ML IJ SOLN
1.0000 mg | Freq: Once | INTRAMUSCULAR | Status: AC
  Administered 2014-08-10: 1 mg via INTRAVENOUS
  Filled 2014-08-10: qty 1

## 2014-08-10 MED ORDER — POTASSIUM CHLORIDE IN NACL 40-0.9 MEQ/L-% IV SOLN
INTRAVENOUS | Status: AC
  Administered 2014-08-10: 125 mL/h via INTRAVENOUS
  Filled 2014-08-10 (×3): qty 1000

## 2014-08-10 MED ORDER — SODIUM CHLORIDE 0.9 % IV BOLUS (SEPSIS)
1000.0000 mL | Freq: Once | INTRAVENOUS | Status: AC
  Administered 2014-08-10: 1000 mL via INTRAVENOUS

## 2014-08-10 MED ORDER — ACETAMINOPHEN 325 MG PO TABS
650.0000 mg | ORAL_TABLET | Freq: Four times a day (QID) | ORAL | Status: DC | PRN
  Administered 2014-08-10 – 2014-08-12 (×3): 650 mg via ORAL
  Filled 2014-08-10 (×4): qty 2

## 2014-08-10 MED ORDER — HYDROMORPHONE HCL PF 1 MG/ML IJ SOLN
1.0000 mg | INTRAMUSCULAR | Status: DC | PRN
  Administered 2014-08-11 (×2): 1 mg via INTRAVENOUS
  Filled 2014-08-10 (×2): qty 1

## 2014-08-10 MED ORDER — SODIUM CHLORIDE 0.9 % IJ SOLN
3.0000 mL | Freq: Two times a day (BID) | INTRAMUSCULAR | Status: DC
  Administered 2014-08-11: 3 mL via INTRAVENOUS

## 2014-08-10 NOTE — Progress Notes (Signed)
RECEIVED FROM ED TO 5W02, FAMILY AT BEDSIDE, DENIES NAUSEA, C/O ABD BACK PAIN  8/10, INSTRUCTED ON CALL SYSTEM AND ROOM SURROUNDINGS

## 2014-08-10 NOTE — H&P (Addendum)
History and Physical  [name]:[id number] DOB: 1981/04/19 DOA: 08/10/2014  Referring physician: Michele Mcalpine, ED PA. PCP: No primary provider on file.  Outpatient Specialists:  1. None  Chief Complaint: Right-sided abdominal pain.  HPI: [name] is a 33 y.o. female with no known past medical history, presents to the ED with right-sided abdominal pain. Patient is Spanish-speaking but speaks fair amount of English to provide adequate history. She states that she was in her usual state of health until 4 days ago when she started experiencing headache and generalized body aches. This was associated with chills but no fevers. She denied any other symptoms at that time. She woke up this morning at approximately 5 AM with acute onset of right-sided abdominal pain which was radiating to the back, rated as 10/10 in severity, made worse with movement and slightly better on lying still. This was associated with 3 episodes of nausea and mostly nonbloody emesis except during the last episode she noticed a streak of blood. She denies diarrhea and had last BM 4 days ago. She states that her urine output was low this morning but has improved in the ED after IV fluid resuscitation. She denies hematuria or dysuria. She denies prior history of renal stones. LMP was 04/26/2014 and she is on contraception. In the ED, patient noted to have low-grade fever, tachycardia, potassium 2.8, glucose 337 mg per DL, mild transaminitis, WBC 11.8 and CT abdomen and pelvis with contrast showing right moderate hydronephrosis and hydroureter without definite evidence of an obstructing stone and mild increased density in the perinephric fat which may reflect inflammation. No acute hepatobiliary nonacute bowel abnormality noted. The uterus and adnexal structures exhibits no acute abnormalities. Hospitalist admission requested.   Review of Systems: All systems reviewed and apart from history of presenting illness, are  negative.  Past Medical History  Diagnosis Date  . Headache    History reviewed. No pertinent past surgical history. Social History:  reports that she has quit smoking. She does not have any smokeless tobacco history on file. She reports that she does not drink alcohol or use illicit drugs. married. Independent of activities of daily living.   No Known Allergies  History reviewed. No pertinent family history.  patient denies family history.   Prior to Admission medications   Medication Sig Start Date End Date Taking? Authorizing Provider  ibuprofen (ADVIL,MOTRIN) 200 MG tablet Take 400 mg by mouth every 6 (six) hours as needed for headache or moderate pain.   Yes Historical Provider, MD  medroxyPROGESTERone (DEPO-PROVERA) 150 MG/ML injection Inject 150 mg into the muscle every 3 (three) months.   Yes Historical Provider, MD   Physical Exam: Filed Vitals:   08/10/14 1300 08/10/14 1342 08/10/14 1400 08/10/14 1524  BP: 110/65 111/67 114/70 116/68  Pulse: 107  112   Temp:      TempSrc:      Resp: 24 19 17 16   SpO2: 95% 99% 97% 100%   MAXIMUM TEMPERATURE 99.78F    General exam: Moderately built and nourished  young female patient, lying uncomfortably supine on the gurney in no obvious distress. Mild intermittent painful distress.   Head, eyes and ENT: Nontraumatic and normocephalic. Pupils equally reacting to light and accommodation. Oral mucosa  dry.  Neck: Supple. No JVD, carotid bruit or thyromegaly.  Lymphatics: No lymphadenopathy.  Respiratory system: Clear to auscultation. No increased work of breathing.  Cardiovascular system: S1 and S2 heard, RRR. No JVD, murmurs, gallops, clicks or  pedal edema.  Gastrointestinal system: Abdomen is nondistended, soft. Tender right upper quadrant and right mid quadrant without guarding, rigidity or rebound. Normal bowel sounds heard. No organomegaly or masses appreciated. No renal angle tenderness.   Central nervous system: Alert and  oriented. No focal neurological deficits.  Extremities: Symmetric 5 x 5 power. Peripheral pulses symmetrically felt.   Skin: No rashes or acute findings.  Musculoskeletal system: Negative exam.  Psychiatry: Pleasant and cooperative.   Labs on Admission:  Basic Metabolic Panel:  Recent Labs Lab 08/10/14 1024 08/10/14 1313  NA 135* 141  K 3.9 2.8*  CL 98 103  CO2 17* 19  GLUCOSE 337* 246*  BUN 7 7  CREATININE 0.51 0.56  CALCIUM 9.2 8.4   Liver Function Tests:  Recent Labs Lab 08/10/14 1024 08/10/14 1313  AST 55* 45*  ALT 38* 36*  ALKPHOS 187* 165*  BILITOT 0.7 0.8  PROT 8.3 7.2  ALBUMIN 3.3* 2.8*   No results found for this basename: LIPASE, AMYLASE,  in the last 168 hours No results found for this basename: AMMONIA,  in the last 168 hours CBC:  Recent Labs Lab 08/10/14 1024  WBC 11.8*  HGB 15.0  HCT 42.6  MCV 86.4  PLT 251   Cardiac Enzymes: No results found for this basename: CKTOTAL, CKMB, CKMBINDEX, TROPONINI,  in the last 168 hours  BNP (last 3 results) No results found for this basename: PROBNP,  in the last 8760 hours CBG: No results found for this basename: GLUCAP,  in the last 168 hours  Radiological Exams on Admission: Ct Abdomen Pelvis Wo Contrast  08/10/2014   CLINICAL DATA:  Right flank and lower quadrant pain with vomiting  EXAM: CT ABDOMEN AND PELVIS WITHOUT CONTRAST  TECHNIQUE: Multidetector CT imaging of the abdomen and pelvis was performed following the standard protocol without IV contrast.  COMPARISON:  None.  FINDINGS: There is moderate right-sided hydronephrosis and hydroureter. Without obvious etiology. There is a calcification on image 82 of series 2 that likely reflects a phlebolith. The right kidney is enlarged. There is a sub mm midpole calcification as well as lower pole calcification. The left kidney is normal. The perinephric fat on the right is mildly increased.  The liver, gallbladder, spleen, pancreas, nondistended stomach,  adrenal glands, and abdominal aorta are normal. There are few normal sized pericaval lymph nodes. The small and large bowel exhibit no evidence of ileus nor of obstruction. The appendix is not discretely demonstrated. No pericecal inflammatory changes are demonstrated. The uterus is vertically oriented. It exhibits normal density. There is soft tissue fullness in the right adnexal region. There is no free pelvic fluid. There is no inguinal nor significant umbilical hernia.  The lung bases exhibit subsegmental atelectasis posteriorly. The lumbar spine and bony pelvis are unremarkable.  IMPRESSION: 1. There is enlargement of the right kidney with moderate hydronephrosis and hydroureter without definite evidence of an obstructing stone. Mildly increased density in the perinephric fat may reflect inflammation. Urologic evaluation is recommended. 2. There is no acute hepatobiliary nor acute bowel abnormality. 3. The uterus and adnexal structures exhibit no acute abnormalities.   Electronically Signed   By: David  Martinique   On: 08/10/2014 12:13    EKG: Independently reviewed.  Sinus tachycardia at 112 beats per minute without acute abnormalities.   Assessment/Plan Principal Problem:   Hydronephrosis Active Problems:   Hypokalemia   Nausea and vomiting   Dehydration   Pyelonephritis, acute   Hyperglycemia   1.  Abdominal  pain secondary to right hydronephrosis/hydroureter: No obstructing stone seen on CT. Urology consulted and await input. She will require some form of decompression procedure-cystoscopy/percutaneous nephrostomy. Etiology unclear-no abdominal or pelvic masses appreciated, no h/o prior abdominal surgery for adhesions or urological procedures for strictures as etiology. Pain management.  2.  Possible acute right pyelonephritis: Although patient has no dysuria and only rare bacteria seen on UA, CT suggests mild increased density in the perinephric fat. She also has low-grade fever and mild  leukocytosis. Start IV Rocephin pending blood and urine culture results. 3. Nausea and vomiting: Secondary to problem #1 and 2. Downgraded to clear liquid diet, IV fluids and when necessary IV antiemetics. 4. Dehydration: IV fluids. 5. Hypokalemia: Patient is status post by mouth potassium 40 meq. x1 and IV 10 meq x1. Continue potassium in IV fluids. Recheck BMP and magnesium and replace as needed. 6. Hyperglycemia: Does not carry history of DM. Check A1c and place on SSI. Aggressive IV fluid hydration. 7. Abnormal LFTs: Unclear etiology. Denies alcohol intake. No acute hepatobiliary issues on CT. Follow LFTs in a.m. Check lipase.    Code Status: Full   Family Communication:  discussed with spouse at bedside.   Disposition Plan:  home when medically stable.    Time spent:  65 minutes   Rosalynd Mcwright, MD, FACP, FHM. [facility] Pager [phone]  If 7PM-7AM, please contact night-coverage [email or web address] Password [facility] 08/10/2014, 4:56 PM

## 2014-08-10 NOTE — Progress Notes (Signed)
Received report from ED RN.

## 2014-08-10 NOTE — ED Provider Notes (Signed)
Medical screening examination/treatment/procedure(s) were performed by non-physician practitioner and as supervising physician I was immediately available for consultation/collaboration.   EKG Interpretation None        Ernestina Patches, MD 08/10/14 1709

## 2014-08-10 NOTE — Progress Notes (Signed)
  CARE MANAGEMENT ED NOTE 08/10/2014  Patient:  [name], [name]   Account Number:  [id number]  Date Initiated:  08/10/2014  Documentation initiated by:  Edwyna Shell  Subjective/Objective Assessment:   33 yo female presenting to the ED with c/o abdominal pain     Subjective/Objective Assessment Detail:     Action/Plan:   Patient is to reestablish care at the [facility] upon discharge in unable to connect with Clinic while patient in ED. CM will follow.   Action/Plan Detail:   Anticipated DC Date:       Status Recommendation to Physician:   Result of Recommendation:  Agreed    DC Planning Services  CM consult  PCP issues    Choice offered to / List presented to:            Status of service:  In process, will continue to follow  ED Comments:   ED Comments Detail:  CM consulted to assist patient with establishing care with a PCP. This CM spoke with ht patient reagrding follow up with a PCP. She stated [facility] and has been a patient at the [facility] in the past and has had an ornage card. This CM provided the patient with a pamphlet for the [facility]. This CM l/m with the [facility] to set up an appointment for the patient with a PCP and with the fiancial counselor for the orange card. The patient was encouraged to follow up at the [facility] upon discharge to establish care if this CM has not had a return call to establish care. This CM contact information has been provided to the patient for follow up assist post discharge to assist in establishing care with a provider. Patient verbalized understanding and no other questions or concerns at this time.

## 2014-08-10 NOTE — Progress Notes (Signed)
Called [phone] to get ED report and was told that RN busy and not answering her phone.

## 2014-08-10 NOTE — ED Notes (Addendum)
Pt with RLQ pain that radiates to R flank, with vomiting and chills since Monday 8/24. Decreased appetite.

## 2014-08-10 NOTE — ED Provider Notes (Signed)
CSN: [id number]     Arrival date & time 08/10/14  4332 History  This chart was scribed for non-physician practitioner, Michele Mcalpine, PA-C, working with Ernestina Patches, MD, by Delphia Grates, ED Scribe. This patient was seen in room E41C/E41C and the patient's care was started at 10:09 AM.     Chief Complaint  Patient presents with  . Abdominal Pain  . Emesis      The history is provided by the patient. No language interpreter was used.    HPI Comments: [name] is a 33 y.o. female who presents to the Emergency Department complaining of sudden onset, constant, severe RLQ abdominal pain onset this morning. Patient states the pain woke her from sleep and reports it radiates to her right flank. No aggravating or alleviating factors. There is associated generalized body aches, chills, subjective fever (triage temp 99.1 F), and decreased appetite. States she had nausea and an episode of vomiting earlier this morning. When she tried to urinate earlier today she only had a small amount comes out. She denies hematuria, dysuria, increased urinary frequency, urgency or diarrhea. Last bowel movement was 4 days ago. Patient denies history of kidney stones. No history of abdominal surgeries.   Past Medical History  Diagnosis Date  . Headache    History reviewed. No pertinent past surgical history. History reviewed. No pertinent family history. History  Substance Use Topics  . Smoking status: Former Research scientist (life sciences)  . Smokeless tobacco: Not on file  . Alcohol Use: No   OB History   Grav Para Term Preterm Abortions TAB SAB Ect Mult Living                 Review of Systems  Constitutional: Positive for chills and appetite change.  Gastrointestinal: Positive for nausea, vomiting and abdominal pain. Negative for diarrhea.  Genitourinary: Negative for hematuria.  All other systems reviewed and are negative.     Allergies  Review of patient's allergies indicates no known  allergies.  Home Medications   Prior to Admission medications   Medication Sig Start Date End Date Taking? Authorizing Provider  SUMAtriptan (IMITREX) 25 MG tablet Take 1 tablet (25 mg total) by mouth every 2 (two) hours as needed for migraine or headache. May repeat in 2 hours if headache persists or recurs. No more than 3 tabs in 24 hour period 08/05/14   Dot Lanes, MD   Triage Vitals: BP 101/73  Temp(Src) 99.1 F (37.3 C) (Oral)  Resp 20  SpO2 100%  LMP 04/26/2014  Physical Exam  Nursing note and vitals reviewed. Constitutional: She is oriented to person, place, and time. She appears well-developed and well-nourished. No distress.  Appears uncomfortable but in no apparent distress.  HENT:  Head: Normocephalic and atraumatic.  Mouth/Throat: Oropharynx is clear and moist.  Eyes: Conjunctivae and EOM are normal.  Neck: Normal range of motion. Neck supple.  Cardiovascular: Normal rate, regular rhythm and normal heart sounds.   Pulmonary/Chest: Effort normal and breath sounds normal. No respiratory distress.  Abdominal: Normal appearance. She exhibits no distension. There is tenderness in the left lower quadrant. There is guarding and CVA tenderness (right). There is no rigidity and no rebound.  No peritoneal signs.  Musculoskeletal: Normal range of motion. She exhibits no edema.  Neurological: She is alert and oriented to person, place, and time. No sensory deficit.  Skin: Skin is warm and dry. She is not diaphoretic.  Psychiatric: She has a normal mood and affect. Her behavior is normal.  ED Course  Procedures (including critical care time)  DIAGNOSTIC STUDIES: Oxygen Saturation is 100% on room air, normal by my interpretation.    COORDINATION OF CARE: At 0626 Discussed treatment plan with patient which includes CT scan and labs. Patient agrees.   Labs Review Labs Reviewed  CBC - Abnormal; Notable for the following:    WBC 11.8 (*)    All other components within  normal limits  COMPREHENSIVE METABOLIC PANEL - Abnormal; Notable for the following:    Sodium 135 (*)    CO2 17 (*)    Glucose, Bld 337 (*)    Albumin 3.3 (*)    AST 55 (*)    ALT 38 (*)    Alkaline Phosphatase 187 (*)    Anion gap 20 (*)    All other components within normal limits  URINALYSIS, ROUTINE W REFLEX MICROSCOPIC - Abnormal; Notable for the following:    Color, Urine AMBER (*)    APPearance CLOUDY (*)    Specific Gravity, Urine 1.046 (*)    Glucose, UA >1000 (*)    Hgb urine dipstick TRACE (*)    Ketones, ur 15 (*)    Protein, ur 100 (*)    Urobilinogen, UA >8.0 (*)    All other components within normal limits  URINE MICROSCOPIC-ADD ON - Abnormal; Notable for the following:    Squamous Epithelial / LPF MANY (*)    All other components within normal limits  POC URINE PREG, ED    Imaging Review Ct Abdomen Pelvis Wo Contrast  08/10/2014   CLINICAL DATA:  Right flank and lower quadrant pain with vomiting  EXAM: CT ABDOMEN AND PELVIS WITHOUT CONTRAST  TECHNIQUE: Multidetector CT imaging of the abdomen and pelvis was performed following the standard protocol without IV contrast.  COMPARISON:  None.  FINDINGS: There is moderate right-sided hydronephrosis and hydroureter. Without obvious etiology. There is a calcification on image 82 of series 2 that likely reflects a phlebolith. The right kidney is enlarged. There is a sub mm midpole calcification as well as lower pole calcification. The left kidney is normal. The perinephric fat on the right is mildly increased.  The liver, gallbladder, spleen, pancreas, nondistended stomach, adrenal glands, and abdominal aorta are normal. There are few normal sized pericaval lymph nodes. The small and large bowel exhibit no evidence of ileus nor of obstruction. The appendix is not discretely demonstrated. No pericecal inflammatory changes are demonstrated. The uterus is vertically oriented. It exhibits normal density. There is soft tissue fullness  in the right adnexal region. There is no free pelvic fluid. There is no inguinal nor significant umbilical hernia.  The lung bases exhibit subsegmental atelectasis posteriorly. The lumbar spine and bony pelvis are unremarkable.  IMPRESSION: 1. There is enlargement of the right kidney with moderate hydronephrosis and hydroureter without definite evidence of an obstructing stone. Mildly increased density in the perinephric fat may reflect inflammation. Urologic evaluation is recommended. 2. There is no acute hepatobiliary nor acute bowel abnormality. 3. The uterus and adnexal structures exhibit no acute abnormalities.   Electronically Signed   By: David  Martinique   On: 08/10/2014 12:13     EKG Interpretation None      MDM   Final diagnoses:  Hydronephrosis, unspecified hydronephrosis type  Hydroureter  Lactic acidosis  Hyperglycemia  Hypokalemia    Patient presenting with sudden onset right lower quadrant pain radiating towards her right flank with associated body aches, subjective fever and chills beginning a few days ago. She  is nauseated and has a decreased appetite. Given right lower cautery tenderness, flank pain and right-sided CVA tenderness, suspicion for renal stone greater than appendicitis. Plan to obtain labs, UA, CT without contrast. 12:41 PM CT scan showing enlargement of the right kidney with moderate hydronephrosis and hydroureter with evidence of an obstructing stone. The patient may have passed a kidney stone. Urine negative for infection, trace amount of blood seen. She has a large amount of glucose in her urine with 15 ketones. Glucose 337 on CMP. Anion gap of 20. Patient states she does not have a history of diabetes and over the past 2 years when she had her sugar checked it was normal. She does not have a PCP at this time. Liver functions elevated, however slight hemolysis noted. Liver and gallbladder seen on CT scan without any acute findings. She is not tender in the right  upper quadrant. Plan to give IV fluids and recheck metabolic panel. Will also consult case management to help patient establish care with a PCP. 3:00 PM K 2.8. Potassium will be replaced PO and IV. Case discussed with attending Dr. Tawnya Crook who agrees with plan of care.  4:31 PM Pt admitted to triad. I spoke with Dr. Janice Norrie (urology) regarding CT who will consult patient on admission.  I personally performed the services described in this documentation, which was scribed in my presence. The recorded information has been reviewed and is accurate.   Illene Labrador, PA-C 08/10/14 [phone]

## 2014-08-10 NOTE — ED Notes (Signed)
Patient transported to CT 

## 2014-08-11 ENCOUNTER — Inpatient Hospital Stay (HOSPITAL_COMMUNITY): Payer: Medicaid Other

## 2014-08-11 DIAGNOSIS — N10 Acute tubulo-interstitial nephritis: Secondary | ICD-10-CM

## 2014-08-11 DIAGNOSIS — R6510 Systemic inflammatory response syndrome (SIRS) of non-infectious origin without acute organ dysfunction: Secondary | ICD-10-CM

## 2014-08-11 LAB — BASIC METABOLIC PANEL
ANION GAP: 15 (ref 5–15)
BUN: 7 mg/dL (ref 6–23)
CO2: 19 mEq/L (ref 19–32)
CREATININE: 0.49 mg/dL — AB (ref 0.50–1.10)
Calcium: 8.7 mg/dL (ref 8.4–10.5)
Chloride: 103 mEq/L (ref 96–112)
GFR calc Af Amer: >90 mL/min (ref >90)
Glucose, Bld: 224 mg/dL — ABNORMAL HIGH (ref 70–99)
POTASSIUM: 3.9 meq/L (ref 3.7–5.3)
Sodium: 137 mEq/L (ref 137–147)

## 2014-08-11 LAB — HEPATIC FUNCTION PANEL
ALBUMIN: 2.6 g/dL — AB (ref 3.5–5.2)
ALK PHOS: 246 U/L — AB (ref 39–117)
ALT: 54 U/L — ABNORMAL HIGH (ref 0–35)
AST: 103 U/L — ABNORMAL HIGH (ref 0–37)
BILIRUBIN INDIRECT: 0.5 mg/dL (ref 0.3–0.9)
Bilirubin, Direct: 0.8 mg/dL — ABNORMAL HIGH (ref 0.0–0.3)
TOTAL PROTEIN: 7 g/dL (ref 6.0–8.3)
Total Bilirubin: 1.3 mg/dL — ABNORMAL HIGH (ref 0.3–1.2)

## 2014-08-11 LAB — CBC
HCT: 37.8 % (ref 36.0–46.0)
Hemoglobin: 12.7 g/dL (ref 12.0–15.0)
MCH: 29.9 pg (ref 26.0–34.0)
MCHC: 33.6 g/dL (ref 30.0–36.0)
MCV: 88.9 fL (ref 78.0–100.0)
PLATELETS: 238 10*3/uL (ref 150–400)
RBC: 4.25 MIL/uL (ref 3.87–5.11)
RDW: 13.1 % (ref 11.5–15.5)
WBC: 13.2 10*3/uL — ABNORMAL HIGH (ref 4.0–10.5)

## 2014-08-11 LAB — GLUCOSE, CAPILLARY
GLUCOSE-CAPILLARY: 224 mg/dL — AB (ref 70–99)
Glucose-Capillary: 235 mg/dL — ABNORMAL HIGH (ref 70–99)
Glucose-Capillary: 221 mg/dL — ABNORMAL HIGH (ref 70–99)
Glucose-Capillary: 243 mg/dL — ABNORMAL HIGH (ref 70–99)

## 2014-08-11 MED ORDER — SODIUM CHLORIDE 0.9 % IV SOLN
INTRAVENOUS | Status: DC
  Administered 2014-08-11: 75 mL/h via INTRAVENOUS
  Administered 2014-08-12 (×2): via INTRAVENOUS

## 2014-08-11 MED ORDER — LIVING WELL WITH DIABETES BOOK - IN SPANISH
Freq: Once | Status: AC
  Administered 2014-08-11: 1
  Filled 2014-08-11: qty 1

## 2014-08-11 MED ORDER — INSULIN GLARGINE 100 UNIT/ML ~~LOC~~ SOLN
10.0000 [IU] | Freq: Every day | SUBCUTANEOUS | Status: DC
  Administered 2014-08-11: 10 [IU] via SUBCUTANEOUS
  Filled 2014-08-11 (×2): qty 0.1

## 2014-08-11 NOTE — Progress Notes (Signed)
Inpatient Diabetes Program Recommendations  AACE/ADA: New Consensus Statement on Inpatient Glycemic Control (2013)  Target Ranges:  Prepandial:   less than 140 mg/dL      Peak postprandial:   less than 180 mg/dL (1-2 hours)      Critically ill patients:  140 - 180 mg/dL   Reason for Visit: Patient with new onset diabetes.   Spoke to patient regarding new diagnosis of diabetes.  She prefers to read material in [place].  Discussed briefly new diagnosis including Type 2 diabetes, A1C goals, etc.  She states that she was told at a physical several years ago that her CBG's were elevated, however that last year they were better.  She drinks water and states that she does not eat pastas or rice.  Explained that tortillas, potatoes, starch veggies, etc. Also raise blood sugar levels.  She states that she used to have an orange card, but that she needs to renew.  Plans to follow-up at [facility].  Discussed high blood sugar, complications, normal CBG's, where to get and cost of generic meter from [employer], and the importance of excercise.  She states that her mother has diabetes also.  Patient was able to teach back and answer questions appropriately.  Discussed with RN.  Patient will need to watch diabetes videos and practice pricking finger for monitoring.   Agree with the addition of oral agents at discharge however she needs close follow-up, education, etc. With PCP to ensure proper management of diabetes.  Explained this to patient also.    Adah Perl, RN, BC-ADM Inpatient Diabetes Coordinator Pager [phone]

## 2014-08-11 NOTE — Plan of Care (Cosign Needed)
Problem: Food- and Nutrition-Related Knowledge Deficit (NB-1.1) Goal: Nutrition education Formal process to instruct or train a patient/client in a skill or to impart knowledge to help patients/clients voluntarily manage or modify food choices and eating behavior to maintain or improve health. Outcome: Completed/Met Date Met:  08/11/14  RD consulted for nutrition education regarding diabetes.   Pt is newly diagnosed with diabetes. Seemed drowsy during visit. Patient states that her diet at home consists of meats, vegetables and tortillas. Discussed with patient what carbohydrates are, how they affect blood sugar levels, the importance of eating 3 meals a day, sugar-free beverages, and nutrition facts labels.    Lab Results  Component Value Date    HGBA1C 11.6* 08/10/2014    RD provided Spanish language "Carbohydrate Counting for People with Diabetes"handout from the Academy of Nutrition and Dietetics. Discussed different food groups and their effects on blood sugar, emphasizing carbohydrate-containing foods. Provided list of carbohydrates and recommended serving sizes of common foods.  Discussed importance of controlled and consistent carbohydrate intake throughout the day. Provided examples of ways to balance meals/snacks and encouraged intake of high-fiber, whole grain complex carbohydrates. Teach back method used.  Expect poor compliance.  Body mass index is 26.47 kg/(m^2). Pt meets criteria for overweight based on current BMI.  Current diet order is clear liquid, patient is consuming approximately 0% of meals at this time. Labs and medications reviewed. RD contact information provided. If additional nutrition issues arise, please re-consult RD.  Lindsey Baker, MS, PLDN Provisionally Licensed Dietitian Nutritionist Pager: [phone]        

## 2014-08-11 NOTE — Progress Notes (Signed)
Patient ID: [name], female   DOB: 08-Aug-1981, 34 y.o.   MRN: [id number]   Afebrile today Renal ultrasound shows resolution of the hydronephrosis. She probably passed a kidney stone. She an be discharged urologically when stable. Will follow as outpatient.

## 2014-08-11 NOTE — Progress Notes (Signed)
Chaplain responded to spiritual care consult. Patient appeared drowsy, but receptive. She said she is Catholic and received a visit today from the visiting [place] ministers through the Spiritual Care Dept. She said she had no other needs at this time but appreciated visit. Please page for follow up.   Ethelene Browns [phone]

## 2014-08-11 NOTE — Progress Notes (Signed)
INITIAL NUTRITION ASSESSMENT  DOCUMENTATION CODES Per approved criteria  -Not Applicable   INTERVENTION:  When diet is advanced from clears, Provide Glucerna Shake po PRN, each supplement provides 220 kcal and 10 grams of protein when PO intake is low.  Encourage PO intake  Will continue to monitor  NUTRITION DIAGNOSIS: Inadequate oral intake related to decreased appetite, abdominal pain as evidenced by poor PO intake <25%.   Goal: Pt to meet >/= 90% of their estimated nutrition needs   Monitor:  PO and supplemental intake, weight, labs, I/O's  Reason for Assessment: Pt identified as at nutrition risk on the Malnutrition Screen Tool Score 4  Admitting Dx: Hydronephrosis  ASSESSMENT: 33 y.o. female with no known past medical history, presents to the ED with right-sided abdominal pain.   Pt reports 32 lb wt loss x 1 year, UBW of 115 lbs. Current documented weight is 140 lb, wt history is a little unclear d/t patient seeming drowsy during visit. Pt states that appetite has been low x 4 days, experiencing some pain during visit. Before the pain started, PO intake was adequate (meat, vegetables, tortillas). During visit, noticed that lunch tray was completely untouched. PO intake last night 100%. Suggest providing Glucerna shake when diet is advanced from clears and if PO intake is low.  Nutrition focused physical exam shows no sign of depletion of muscle mass or body fat.  Labs reviewed: HgbA1c 11.6 Glucose 224 Low Creatinine  Height: Ht Readings from Last 1 Encounters:  08/10/14 5\' 1"  (1.549 m)    Weight: Wt Readings from Last 1 Encounters:  08/10/14 140 lb (63.504 kg)    Ideal Body Weight: 105 lb  % Ideal Body Weight: 133%  Wt Readings from Last 10 Encounters:  08/10/14 140 lb (63.504 kg)  08/05/14 140 lb (63.504 kg)  07/14/14 139 lb 8 oz (63.277 kg)    Usual Body Weight: 115 lb  % Usual Body Weight: 121%  BMI:  Body mass index is 26.47  kg/(m^2).  Estimated Nutritional Needs: Kcal: 1600-1700 Protein: 75-85g Fluid: > 1.6 L/day  Skin: intact  Diet Order: Clear Liquid  EDUCATION NEEDS: -Education needs addressed   Intake/Output Summary (Last 24 hours) at 08/11/14 1123 Last data filed at 08/11/14 0829  Gross per 24 hour  Intake    560 ml  Output    800 ml  Net   -240 ml    Last BM: 8/24   Labs:   Recent Labs Lab 08/10/14 1313 08/10/14 1752 08/11/14 0720  NA 141 138 137  K 2.8* 3.2* 3.9  CL 103 100 103  CO2 19 19 19   BUN 7 6 7   CREATININE 0.56 0.46* 0.49*  CALCIUM 8.4 8.6 8.7  MG  --  1.8  --   GLUCOSE 246* 228* 224*    CBG (last 3)   Recent Labs  08/10/14 1721 08/10/14 2238 08/11/14 0744  GLUCAP 217* 210* 235*    Scheduled Meds: . cefTRIAXone (ROCEPHIN)  IV  1 g Intravenous Q24H  . enoxaparin (LOVENOX) injection  40 mg Subcutaneous Q24H  . insulin aspart  0-9 Units Subcutaneous TID WC  . insulin glargine  10 Units Subcutaneous QHS  . sodium chloride  3 mL Intravenous Q12H    Continuous Infusions:   Past Medical History  Diagnosis Date  . Headache     "monthly" (08/10/2014)    Past Surgical History  Procedure Laterality Date  . No past surgeries      Clayton Bibles, MS, PLDN  Provisionally Licensed Actor: [phone]

## 2014-08-11 NOTE — Progress Notes (Signed)
Note/chart reviewed. Agree with note.   Akaysha Cobern RD, LDN, CNSC [phone] Pager [phone] After Hours Pager   

## 2014-08-11 NOTE — Progress Notes (Signed)
Utilization review completed. Jessenya Berdan, RN, BSN. 

## 2014-08-11 NOTE — Progress Notes (Signed)
[facility] PROGRESS NOTE  [name] XIH:[id number] DOB: Nov 19, 1981 DOA: 08/10/2014 PCP: No PCP Per Patient  Assessment/Plan:  SIR's -likely secondary to pyelonephritis/?passed renal stone -afebrile, normal BP -resolved with IV fluids and IV Antibiotics -continue IV Rocephin (day 2)  Hydronephrosis -possibly secondary to a passed stone -CT showed moderate right hydronephrosis and hydroureter without definite evidence of an obstructing stone. -Seen by Urology- repeat renal US ordered-hydronephroses has resolved. Plans were for cystoscopy/ ureteroscopy if hydronephrosis has not resolved   Pyelonephritis - possibly secondary to hydronephrosis/hydroureter and possibly a passed renal stone - leukocytosis- patient afebrile; complains of right sided flank and back pain-but significantly better than on admission -urine and blood culture pending- will adjust antibiotic after results -continue empiric IV rocephin (day 2)  Diabetes Mellitus -Hgb A1c 11.6 -patient not known diabetic -started on Insulin 10 units QHS, will start oral hypoglycemic agents on discharge -diabetes education consult   Hypokalemia -repleted -check lytes periodically   Transaminitis -8/28- AST 103; normal ALT; alk phos increasing 246; lipase normal at 12 -No acute hepatobiliary abnormality on CT-suspect may be secondary to SIR's/Sepsis-repeat LFT's in am -Denies alcohol use -obtain hepatitis serology   DVT Prophylaxis: Lovenox 40 mg sq Code Status: Full Family Communication: No family at bedside Disposition Plan: Home when medically stable   Consultants:  None  Procedures:  None  Antibiotics:  Ceftriaxone (day 2)  HPI: [name] is a 33 y.o. female with no known past medical history, presented to the ED with right-sided abdominal pain since yesterday.  She stated she had a headache and generalized body aches for the past four days, and woke up yesterday am with the severe  right sided abdominal pain which radiates to the back and associated with low urine output, and 3 episodes of nausea, and  nonbloody emesis except during the last episode she noticed a streak of blood.  LMP was 04/26/2014 and is on contraceptives; and LBM 4 days ago.  She denies changes to her bowels, hematuria, or dysuria.   In the ED, she was septic, and CT of abdomen and pelvis with evidence of right moderate hydronephrosis and mild increased density in the perinephric fat which may reflect inflammation. She was started on IV fluids and IV rocephin.    Subjective: Complains of pain in the right lower abdomen that radiates to her back.  Ambulated to bathroom, and BM yesterday  Objective: Filed Vitals:   08/11/14 0626  BP: 106/62  Pulse: 97  Temp: 98.4 F (36.9 C)  Resp: 16    Intake/Output Summary (Last 24 hours) at 08/11/14 0846 Last data filed at 08/11/14 3491  Gross per 24 hour  Intake    560 ml  Output    800 ml  Net   -240 ml   Filed Weights   08/10/14 1825  Weight: 63.504 kg (140 lb)    Exam:  Gen: Alert and oriented, in no acute distress, sitting in the chair  HEENT: Normocephalic, atraumatic.  Pupils symmertrical.  Moist mucosa.   Chest: clear to auscultate bilaterally, no ronchi or rales  Cardiac: Regular rate and rhythm, S1-S2, no rubs murmurs or gallops  Abdomen: soft, non distended, + bowel sounds.Tenderness in the right lower abdomen; + right CVA tenderness. No guarding or rigidity  Extremities: Symmetrical in appearance without cyanosis or edema  Neurological: Alert awake oriented to time place and person.  Psychiatric: Appears normal.   Data Reviewed: Basic Metabolic Panel:  Recent Labs Lab 08/10/14 1024 08/10/14 1313  08/10/14 1752  NA 135* 141 138  K 3.9 2.8* 3.2*  CL 98 103 100  CO2 17* 19 19  GLUCOSE 337* 246* 228*  BUN 7 7 6   CREATININE 0.51 0.56 0.46*  CALCIUM 9.2 8.4 8.6  MG  --   --  1.8   Liver Function Tests:  Recent Labs Lab  08/10/14 1024 08/10/14 1313  AST 55* 45*  ALT 38* 36*  ALKPHOS 187* 165*  BILITOT 0.7 0.8  PROT 8.3 7.2  ALBUMIN 3.3* 2.8*    Recent Labs Lab 08/10/14 1752  LIPASE 12   No results found for this basename: AMMONIA,  in the last 168 hours CBC:  Recent Labs Lab 08/10/14 1024 08/11/14 0720  WBC 11.8* 13.2*  HGB 15.0 12.7  HCT 42.6 37.8  MCV 86.4 88.9  PLT 251 238   Cardiac Enzymes: No results found for this basename: CKTOTAL, CKMB, CKMBINDEX, TROPONINI,  in the last 168 hours BNP (last 3 results) No results found for this basename: PROBNP,  in the last 8760 hours CBG:  Recent Labs Lab 08/10/14 1721 08/10/14 2238 08/11/14 0744  GLUCAP 217* 210* 235*    Recent Results (from the past 240 hour(s))  CULTURE, BLOOD (ROUTINE X 2)     Status: None   Collection Time    08/10/14  5:40 PM      Result Value Ref Range Status   Specimen Description BLOOD HAND RIGHT   Final   Special Requests BOTTLES DRAWN AEROBIC AND ANAEROBIC 3CC   Final   Culture  Setup Time     Final   Value: 08/10/2014 23:27     Performed at [facility]   Culture     Final   Value:        BLOOD CULTURE RECEIVED NO GROWTH TO DATE CULTURE WILL BE HELD FOR 5 DAYS BEFORE ISSUING A FINAL NEGATIVE REPORT     Performed at [facility]   Report Status PENDING   Incomplete  CULTURE, BLOOD (ROUTINE X 2)     Status: None   Collection Time    08/10/14  5:40 PM      Result Value Ref Range Status   Specimen Description BLOOD HAND LEFT   Final   Special Requests BOTTLES DRAWN AEROBIC ONLY 3CC   Final   Culture  Setup Time     Final   Value: 08/10/2014 23:26     Performed at [facility]   Culture     Final   Value:        BLOOD CULTURE RECEIVED NO GROWTH TO DATE CULTURE WILL BE HELD FOR 5 DAYS BEFORE ISSUING A FINAL NEGATIVE REPORT     Performed at [facility]   Report Status PENDING   Incomplete     Studies: Ct Abdomen Pelvis Wo Contrast  08/10/2014   CLINICAL DATA:   Right flank and lower quadrant pain with vomiting  EXAM: CT ABDOMEN AND PELVIS WITHOUT CONTRAST  TECHNIQUE: Multidetector CT imaging of the abdomen and pelvis was performed following the standard protocol without IV contrast.  COMPARISON:  None.  FINDINGS: There is moderate right-sided hydronephrosis and hydroureter. Without obvious etiology. There is a calcification on image 82 of series 2 that likely reflects a phlebolith. The right kidney is enlarged. There is a sub mm midpole calcification as well as lower pole calcification. The left kidney is normal. The perinephric fat on the right is mildly increased.  The liver, gallbladder, spleen, pancreas,  nondistended stomach, adrenal glands, and abdominal aorta are normal. There are few normal sized pericaval lymph nodes. The small and large bowel exhibit no evidence of ileus nor of obstruction. The appendix is not discretely demonstrated. No pericecal inflammatory changes are demonstrated. The uterus is vertically oriented. It exhibits normal density. There is soft tissue fullness in the right adnexal region. There is no free pelvic fluid. There is no inguinal nor significant umbilical hernia.  The lung bases exhibit subsegmental atelectasis posteriorly. The lumbar spine and bony pelvis are unremarkable.  IMPRESSION: 1. There is enlargement of the right kidney with moderate hydronephrosis and hydroureter without definite evidence of an obstructing stone. Mildly increased density in the perinephric fat may reflect inflammation. Urologic evaluation is recommended. 2. There is no acute hepatobiliary nor acute bowel abnormality. 3. The uterus and adnexal structures exhibit no acute abnormalities.   Electronically Signed   By: David  Martinique   On: 08/10/2014 12:13   US Renal  08/11/2014   CLINICAL DATA:  Right hydronephrosis.  EXAM: RENAL/URINARY TRACT ULTRASOUND COMPLETE  COMPARISON:  CT scan of August 10, 2014.  FINDINGS: Right Kidney:  Length: 13.4 cm. No significant  hydronephrosis is seen at this time. Echogenicity is within normal limits. Minimal amount of perinephric fluid is seen around the lower pole.  Left Kidney:  Length: 12.9 cm. Echogenicity within normal limits. No mass or hydronephrosis visualized.  Bladder:  Appears normal for degree of bladder distention. Bilateral ureteral jets are noted.  IMPRESSION: Right-sided hydronephrosis noted on prior exam appears to have nearly resolved. No significant renal abnormality is seen at this time.   Electronically Signed   By: Sabino Dick M.D.   On: 08/11/2014 07:50    Scheduled Meds: . cefTRIAXone (ROCEPHIN)  IV  1 g Intravenous Q24H  . enoxaparin (LOVENOX) injection  40 mg Subcutaneous Q24H  . insulin aspart  0-9 Units Subcutaneous TID WC  . sodium chloride  3 mL Intravenous Q12H   Continuous Infusions: . 0.9 % NaCl with KCl 40 mEq / L 125 mL/hr (08/10/14 1850)    Principal Problem:   Hydronephrosis Active Problems:   Hypokalemia   Nausea and vomiting   Dehydration   Pyelonephritis, acute   Hyperglycemia    Time spent: [place], [place] [facility]  [facility] Pager [phone]. If 7PM-7AM, please contact night-coverage at [email or web address], password [facility] 08/11/2014, 8:46 AM  LOS: 1 day   Attending Patient was seen, examined,treatment plan was discussed with the Physician extender. I have directly reviewed the clinical findings, lab, imaging studies and management of this patient in detail. I have made the necessary changes to the above noted documentation, and agree with the documentation, as recorded by the Physician extender.  Nena Alexander MD [facility].

## 2014-08-11 NOTE — Consult Note (Signed)
Urology Consult  Referring physician: Vernell Leep, MD Reason for referral: Right hydronephrosis  Chief Complaint: Right sided abdominal apin  History of Present Illness: The patient is a 33 years old female who presented to the ER with sudden onset of severe right sided abdominal pain associated with nausea and vomiting.  She does not have a history of kidney stone.  CT scan shows moderate right hydronephrosis without a definite cause of obstruction.  She denies frequency, urgency, dysuria, hematuria.  She does not smoke.  BUN and creatinine are within normal limits.  Urinalysis shows 11-20 WBC's, 0-2 RBC's, many epithelial cells, rare bacteria.  Past Medical History  Diagnosis Date  . Headache     "monthly" (08/10/2014)   Past Surgical History  Procedure Laterality Date  . No past surgeries      Medications: Rocephin, Lovenox, hydrocodone/APAP, hydromorphone, insulin Allergies: No Known Allergies  History reviewed. No pertinent family history. Social History:  reports that she has never smoked. She has never used smokeless tobacco. She reports that she does not drink alcohol or use illicit drugs.  ROS: All systems are reviewed and negative except as noted.   Physical Exam:  Vital signs in last 24 hours: Temp:  [99.1 F (37.3 C)-102.2 F (39 C)] 99.7 F (37.6 C) (08/27 2242) Pulse Rate:  [103-112] 103 (08/27 2242) Resp:  [16-29] 18 (08/27 2242) BP: (101-123)/(57-73) 112/57 mmHg (08/27 2242) SpO2:  [95 %-100 %] 100 % (08/27 2242) Weight:  [63.504 kg (140 lb)] 63.504 kg (140 lb) (08/27 1825) HEENT: Normal Cardiovascular: Skin warm; not flushed Respiratory: Breaths quiet; no shortness of breath Abdomen: No masses.  Moderate right flank and right lower quadrant tenderness.  Moderate right CVA tenderness. Neurological: Normal sensation to touch Musculoskeletal: Normal motor function arms and legs Lymphatics: No inguinal adenopathy Skin: No rashes   Laboratory Data:   Results for orders placed during the hospital encounter of 08/10/14 (from the past 72 hour(s))  URINALYSIS, ROUTINE W REFLEX MICROSCOPIC     Status: Abnormal   Collection Time    08/10/14 10:12 AM      Result Value Ref Range   Color, Urine AMBER (*) YELLOW   Comment: BIOCHEMICALS MAY BE AFFECTED BY COLOR   APPearance CLOUDY (*) CLEAR   Specific Gravity, Urine 1.046 (*) 1.005 - 1.030   pH 7.0  5.0 - 8.0   Glucose, UA >1000 (*) NEGATIVE mg/dL   Hgb urine dipstick TRACE (*) NEGATIVE   Bilirubin Urine NEGATIVE  NEGATIVE   Ketones, ur 15 (*) NEGATIVE mg/dL   Protein, ur 100 (*) NEGATIVE mg/dL   Urobilinogen, UA >8.0 (*) 0.0 - 1.0 mg/dL   Nitrite NEGATIVE  NEGATIVE   Leukocytes, UA NEGATIVE  NEGATIVE  URINE MICROSCOPIC-ADD ON     Status: Abnormal   Collection Time    08/10/14 10:12 AM      Result Value Ref Range   Squamous Epithelial / LPF MANY (*) RARE   WBC, UA 11-20  <3 WBC/hpf   RBC / HPF 0-2  <3 RBC/hpf   Bacteria, UA RARE  RARE   Urine-Other RARE YEAST    CBC     Status: Abnormal   Collection Time    08/10/14 10:24 AM      Result Value Ref Range   WBC 11.8 (*) 4.0 - 10.5 K/uL   RBC 4.93  3.87 - 5.11 MIL/uL   Hemoglobin 15.0  12.0 - 15.0 g/dL   HCT 42.6  36.0 - 46.0 %  MCV 86.4  78.0 - 100.0 fL   MCH 30.4  26.0 - 34.0 pg   MCHC 35.2  30.0 - 36.0 g/dL   RDW 12.6  11.5 - 15.5 %   Platelets 251  150 - 400 K/uL  COMPREHENSIVE METABOLIC PANEL     Status: Abnormal   Collection Time    08/10/14 10:24 AM      Result Value Ref Range   Sodium 135 (*) 137 - 147 mEq/L   Potassium 3.9  3.7 - 5.3 mEq/L   Comment: HEMOLYSIS AT THIS LEVEL MAY AFFECT RESULT   Chloride 98  96 - 112 mEq/L   CO2 17 (*) 19 - 32 mEq/L   Glucose, Bld 337 (*) 70 - 99 mg/dL   BUN 7  6 - 23 mg/dL   Creatinine, Ser 0.51  0.50 - 1.10 mg/dL   Calcium 9.2  8.4 - 10.5 mg/dL   Total Protein 8.3  6.0 - 8.3 g/dL   Albumin 3.3 (*) 3.5 - 5.2 g/dL   AST 55 (*) 0 - 37 U/L   Comment: HEMOLYSIS AT THIS LEVEL MAY  AFFECT RESULT   ALT 38 (*) 0 - 35 U/L   Comment: HEMOLYSIS AT THIS LEVEL MAY AFFECT RESULT   Alkaline Phosphatase 187 (*) 39 - 117 U/L   Total Bilirubin 0.7  0.3 - 1.2 mg/dL   GFR calc non Af Amer >90  >90 mL/min   GFR calc Af Amer >90  >90 mL/min   Comment: (NOTE)     The eGFR has been calculated using the CKD EPI equation.     This calculation has not been validated in all clinical situations.     eGFR's persistently <90 mL/min signify possible Chronic Kidney     Disease.   Anion gap 20 (*) 5 - 15  POC URINE PREG, ED     Status: None   Collection Time    08/10/14 10:26 AM      Result Value Ref Range   Preg Test, Ur NEGATIVE  NEGATIVE   Comment:            THE SENSITIVITY OF THIS     METHODOLOGY IS >24 mIU/mL  COMPREHENSIVE METABOLIC PANEL     Status: Abnormal   Collection Time    08/10/14  1:13 PM      Result Value Ref Range   Sodium 141  137 - 147 mEq/L   Potassium 2.8 (*) 3.7 - 5.3 mEq/L   Comment: CRITICAL RESULT CALLED TO, READ BACK BY AND VERIFIED WITH:     Leretha Dykes AT 1443 08/10/14 BY K BARR   Chloride 103  96 - 112 mEq/L   CO2 19  19 - 32 mEq/L   Glucose, Bld 246 (*) 70 - 99 mg/dL   BUN 7  6 - 23 mg/dL   Creatinine, Ser 0.56  0.50 - 1.10 mg/dL   Calcium 8.4  8.4 - 10.5 mg/dL   Total Protein 7.2  6.0 - 8.3 g/dL   Albumin 2.8 (*) 3.5 - 5.2 g/dL   AST 45 (*) 0 - 37 U/L   ALT 36 (*) 0 - 35 U/L   Alkaline Phosphatase 165 (*) 39 - 117 U/L   Total Bilirubin 0.8  0.3 - 1.2 mg/dL   GFR calc non Af Amer >90  >90 mL/min   GFR calc Af Amer >90  >90 mL/min   Comment: (NOTE)     The eGFR has been calculated using the CKD  EPI equation.     This calculation has not been validated in all clinical situations.     eGFR's persistently <90 mL/min signify possible Chronic Kidney     Disease.   Anion gap 19 (*) 5 - 15  CBG MONITORING, ED     Status: Abnormal   Collection Time    08/10/14  5:21 PM      Result Value Ref Range   Glucose-Capillary 217 (*) 70 - 99 mg/dL   HEMOGLOBIN A1C     Status: Abnormal   Collection Time    08/10/14  5:52 PM      Result Value Ref Range   Hemoglobin A1C 11.6 (*) <5.7 %   Comment: (NOTE)                                                                               According to the ADA Clinical Practice Recommendations for 2011, when     HbA1c is used as a screening test:      >=6.5%   Diagnostic of Diabetes Mellitus               (if abnormal result is confirmed)     5.7-6.4%   Increased risk of developing Diabetes Mellitus     References:Diagnosis and Classification of Diabetes Mellitus,Diabetes     RKYH,0623,76(EGBTD 1):S62-S69 and Standards of Medical Care in             Diabetes - 2011,Diabetes Care,2011,34 (Suppl 1):S11-S61.   Mean Plasma Glucose 286 (*) <117 mg/dL   Comment: Performed at [facility]     Status: Abnormal   Collection Time    08/10/14  5:52 PM      Result Value Ref Range   Sodium 138  137 - 147 mEq/L   Potassium 3.2 (*) 3.7 - 5.3 mEq/L   Chloride 100  96 - 112 mEq/L   CO2 19  19 - 32 mEq/L   Glucose, Bld 228 (*) 70 - 99 mg/dL   BUN 6  6 - 23 mg/dL   Creatinine, Ser 0.46 (*) 0.50 - 1.10 mg/dL   Calcium 8.6  8.4 - 10.5 mg/dL   GFR calc non Af Amer >90  >90 mL/min   GFR calc Af Amer >90  >90 mL/min   Comment: (NOTE)     The eGFR has been calculated using the CKD EPI equation.     This calculation has not been validated in all clinical situations.     eGFR's persistently <90 mL/min signify possible Chronic Kidney     Disease.   Anion gap 19 (*) 5 - 15  MAGNESIUM     Status: None   Collection Time    08/10/14  5:52 PM      Result Value Ref Range   Magnesium 1.8  1.5 - 2.5 mg/dL  LIPASE, BLOOD     Status: None   Collection Time    08/10/14  5:52 PM      Result Value Ref Range   Lipase 12  11 - 59 U/L  GLUCOSE, CAPILLARY     Status: Abnormal   Collection Time    08/10/14 10:38 PM  Result Value Ref Range   Glucose-Capillary 210 (*) 70 - 99 mg/dL    Comment 1 Notify RN     Comment 2 Documented in Chart     No results found for this or any previous visit (from the past 240 hour(s)). Creatinine:  Recent Labs  08/10/14 1024 08/10/14 1313 08/10/14 1752  CREATININE 0.51 0.56 0.46*    Xrays: I reviewed the CT scan and there is moderate right hydronephrosis.  No stone is identified in the ureter.  Impression/Assessment:  Right hydronephrosis.  Patient may have passed a kidney stone.  Plan:  Strain all urine.  Renal ultrasound in AM. If hydronephrosis does not resolve she will need cystoscopy, retrograde pyelogram with ureteroscopy. Will follow patient with you.  Arvil Persons 08/11/2014, 1:58 AM   CC: Vernell Leep, MD

## 2014-08-12 LAB — COMPREHENSIVE METABOLIC PANEL
ALT: 64 U/L — AB (ref 0–35)
ANION GAP: 18 — AB (ref 5–15)
AST: 48 U/L — ABNORMAL HIGH (ref 0–37)
Albumin: 2.4 g/dL — ABNORMAL LOW (ref 3.5–5.2)
Alkaline Phosphatase: 242 U/L — ABNORMAL HIGH (ref 39–117)
BUN: 7 mg/dL (ref 6–23)
CALCIUM: 9 mg/dL (ref 8.4–10.5)
CO2: 17 mEq/L — ABNORMAL LOW (ref 19–32)
Chloride: 102 mEq/L (ref 96–112)
Creatinine, Ser: 0.41 mg/dL — ABNORMAL LOW (ref 0.50–1.10)
GFR calc Af Amer: >90 mL/min (ref >90)
GFR calc non Af Amer: >90 mL/min (ref >90)
Glucose, Bld: 181 mg/dL — ABNORMAL HIGH (ref 70–99)
Potassium: 3.2 mEq/L — ABNORMAL LOW (ref 3.7–5.3)
SODIUM: 137 meq/L (ref 137–147)
TOTAL PROTEIN: 7.4 g/dL (ref 6.0–8.3)
Total Bilirubin: 0.5 mg/dL (ref 0.3–1.2)

## 2014-08-12 LAB — GLUCOSE, CAPILLARY
Glucose-Capillary: 182 mg/dL — ABNORMAL HIGH (ref 70–99)
Glucose-Capillary: 156 mg/dL — ABNORMAL HIGH (ref 70–99)
Glucose-Capillary: 132 mg/dL — ABNORMAL HIGH (ref 70–99)
Glucose-Capillary: 177 mg/dL — ABNORMAL HIGH (ref 70–99)

## 2014-08-12 LAB — CBC
HCT: 37.9 % (ref 36.0–46.0)
HEMOGLOBIN: 13.2 g/dL (ref 12.0–15.0)
MCH: 30 pg (ref 26.0–34.0)
MCHC: 34.8 g/dL (ref 30.0–36.0)
MCV: 86.1 fL (ref 78.0–100.0)
Platelets: 265 10*3/uL (ref 150–400)
RBC: 4.4 MIL/uL (ref 3.87–5.11)
RDW: 13.2 % (ref 11.5–15.5)
WBC: 9.8 10*3/uL (ref 4.0–10.5)

## 2014-08-12 LAB — HEPATITIS PANEL, ACUTE
HCV Ab: NEGATIVE
HEP B S AG: NEGATIVE
Hep A IgM: NONREACTIVE
Hep B C IgM: NONREACTIVE

## 2014-08-12 MED ORDER — INSULIN ASPART PROT & ASPART (70-30 MIX) 100 UNIT/ML ~~LOC~~ SUSP
15.0000 [IU] | Freq: Two times a day (BID) | SUBCUTANEOUS | Status: DC
  Administered 2014-08-12 – 2014-08-14 (×4): 15 [IU] via SUBCUTANEOUS
  Filled 2014-08-12: qty 10

## 2014-08-12 MED ORDER — POTASSIUM CHLORIDE CRYS ER 20 MEQ PO TBCR
40.0000 meq | EXTENDED_RELEASE_TABLET | Freq: Once | ORAL | Status: AC
  Administered 2014-08-12: 40 meq via ORAL
  Filled 2014-08-12: qty 2

## 2014-08-12 NOTE — Progress Notes (Signed)
PATIENT DETAILS Name: [name] Age: 33 y.o. Sex: female Date of Birth: 1981/06/24 Admit Date: 08/10/2014 Admitting Physician Modena Jansky, MD PCP:No PCP Per Patient  Subjective: Improved, minimal right flank pain today  Assessment/Plan: SIR's  -likely secondary to pyelonephritis/?passed renal stone  -febrile-tmax 102.6, normal BP  -resolved with IV fluids and IV Antibiotics  -continue IV Rocephin (day 3)   Hydronephrosis  -possibly secondary to a passed stone  -CT showed moderate right hydronephrosis and hydroureter without definite evidence of an obstructing stone.  -Seen by Urology- repeat renal US ordered-hydronephroses has resolved. Plans were for cystoscopy/ ureteroscopy if hydronephrosis had not resolved   Pyelonephritis  - possibly secondary to hydronephrosis/hydroureter and possibly a passed renal stone  - leukocytosis- patient afebrile; complains of right sided flank and back pain-but significantly better than on admission  -urine culture-prelim-Proteus-senisitivity pending, blood culture negative so far  -continue empiric IV rocephin (day 3)   Diabetes Mellitus  -Hgb A1c 11.6  -patient not known diabetic  -started on Insulin 10 units QHS and SSI- CBG's stable, -will change to Insulin 70/30-will start oral hypoglycemic agents on discharge  -diabetes education consult   Hypokalemia  -repleted  -check lytes periodically   Transaminitis  -8/28- AST 103; normal ALT; alk phos increasing 246; lipase normal at 12  -No acute hepatobiliary abnormality on CT-suspect may be secondary to SIR's/Sepsis-repeat LFT's in am  -Denies alcohol use  -obtain hepatitis serology  Disposition: Remain inpatient  DVT Prophylaxis: Prophylactic Lovenox  Code Status: Full code   Family Communication Spouse at bedside  Procedures:  None  CONSULTS:  urology  Time spent 40 minutes-which includes 50% of the time with face-to-face with patient/ family and  coordinating care related to the above assessment and plan.    MEDICATIONS: Scheduled Meds: . cefTRIAXone (ROCEPHIN)  IV  1 g Intravenous Q24H  . enoxaparin (LOVENOX) injection  40 mg Subcutaneous Q24H  . insulin aspart  0-9 Units Subcutaneous TID WC  . insulin glargine  10 Units Subcutaneous QHS  . sodium chloride  3 mL Intravenous Q12H   Continuous Infusions: . sodium chloride 75 mL/hr at 08/12/14 0413   PRN Meds:.acetaminophen, acetaminophen, albuterol, HYDROcodone-acetaminophen, HYDROmorphone (DILAUDID) injection, ondansetron (ZOFRAN) IV, ondansetron  Antibiotics: Anti-infectives   Start     Dose/Rate Route Frequency Ordered Stop   08/10/14 1700  cefTRIAXone (ROCEPHIN) 1 g in dextrose 5 % 50 mL IVPB     1 g 100 mL/hr over 30 Minutes Intravenous Every 24 hours 08/10/14 1654         PHYSICAL EXAM: Vital signs in last 24 hours: Filed Vitals:   08/11/14 1509 08/11/14 2102 08/12/14 0458 08/12/14 0551  BP: 110/69 117/74 122/72 156/85  Pulse: 87 100 110 60  Temp: 98.5 F (36.9 C) 99.7 F (37.6 C) 102.6 F (39.2 C) 97.4 F (36.3 C)  TempSrc: Oral Oral Oral Oral  Resp: _0 Height:      Weight:      SpO2: 100% 98% 96% 99%    Weight change:  Filed Weights   08/10/14 1825  Weight: 63.504 kg (140 lb)   Body mass index is 26.47 kg/(m^2).   Gen Exam: Awake and alert with clear speech.   Neck: Supple, No JVD.   Chest: B/L Clear.   CVS: S1 S2 Regular, no murmurs.  Abdomen: soft, BS +, non tender, non distended. No CVA tenderness today Extremities: no edema, lower extremities warm to  touch. Neurologic: Non Focal.   Skin: No Rash.   Wounds: N/A.    Intake/Output from previous day:  Intake/Output Summary (Last 24 hours) at 08/12/14 0953 Last data filed at 08/12/14 0706  Gross per 24 hour  Intake    980 ml  Output    600 ml  Net    380 ml     LAB RESULTS: CBC  Recent Labs Lab 08/10/14 1024 08/11/14 0720 08/12/14 0658  WBC 11.8* 13.2* 9.8  HGB  15.0 12.7 13.2  HCT 42.6 37.8 37.9  PLT 251 238 265  MCV 86.4 88.9 86.1  MCH 30.4 29.9 30.0  MCHC 35.2 33.6 34.8  RDW 12.6 13.1 13.2    Chemistries   Recent Labs Lab 08/10/14 1024 08/10/14 1313 08/10/14 1752 08/11/14 0720 08/12/14 0658  NA 135* 141 138 137 137  K 3.9 2.8* 3.2* 3.9 3.2*  CL 98 103 100 103 102  CO2 17* _0 17*  GLUCOSE 337* 246* 228* 224* 181*  BUN _1 CREATININE 0.51 0.56 0.46* 0.49* 0.41*  CALCIUM 9.2 8.4 8.6 8.7 9.0  MG  --   --  1.8  --   --     CBG:  Recent Labs Lab 08/11/14 0744 08/11/14 1207 08/11/14 1741 08/11/14 2105 08/12/14 0748  GLUCAP 235* 221* 243* 224* 182*    GFR Estimated Creatinine Clearance: 86.2 ml/min (by C-G formula based on Cr of 0.41).  Coagulation profile No results found for this basename: INR, PROTIME,  in the last 168 hours  Cardiac Enzymes No results found for this basename: CK, CKMB, TROPONINI, MYOGLOBIN,  in the last 168 hours  No components found with this basename: POCBNP,  No results found for this basename: DDIMER,  in the last 72 hours  Recent Labs  08/10/14 1752  HGBA1C 11.6*   No results found for this basename: CHOL, HDL, LDLCALC, TRIG, CHOLHDL, LDLDIRECT,  in the last 72 hours No results found for this basename: TSH, T4TOTAL, FREET3, T3FREE, THYROIDAB,  in the last 72 hours No results found for this basename: VITAMINB12, FOLATE, FERRITIN, TIBC, IRON, RETICCTPCT,  in the last 72 hours  Recent Labs  08/10/14 1752  LIPASE 12    Urine Studies No results found for this basename: UACOL, UAPR, USPG, UPH, UTP, UGL, UKET, UBIL, UHGB, UNIT, UROB, ULEU, UEPI, UWBC, URBC, UBAC, CAST, CRYS, UCOM, BILUA,  in the last 72 hours  MICROBIOLOGY: Recent Results (from the past 240 hour(s))  URINE CULTURE     Status: None   Collection Time    08/10/14 10:12 AM      Result Value Ref Range Status   Specimen Description URINE, RANDOM   Final   Special Requests ADDED [id number]   Final   Culture   Setup Time     Final   Value: 08/10/2014 22:53     Performed at [place]     Final   Value: >=100,000 COLONIES/ML     Performed at [facility]   Culture     Final   Value: [place]     Performed at [facility]   Report Status PENDING   Incomplete  CULTURE, BLOOD (ROUTINE X 2)     Status: None   Collection Time    08/10/14  5:40 PM      Result Value Ref Range Status   Specimen Description BLOOD HAND RIGHT   Final  Special Requests BOTTLES DRAWN AEROBIC AND ANAEROBIC 3CC   Final   Culture  Setup Time     Final   Value: 08/10/2014 23:27     Performed at [facility]   Culture     Final   Value:        BLOOD CULTURE RECEIVED NO GROWTH TO DATE CULTURE WILL BE HELD FOR 5 DAYS BEFORE ISSUING A FINAL NEGATIVE REPORT     Performed at [facility]   Report Status PENDING   Incomplete  CULTURE, BLOOD (ROUTINE X 2)     Status: None   Collection Time    08/10/14  5:40 PM      Result Value Ref Range Status   Specimen Description BLOOD HAND LEFT   Final   Special Requests BOTTLES DRAWN AEROBIC ONLY 3CC   Final   Culture  Setup Time     Final   Value: 08/10/2014 23:26     Performed at [facility]   Culture     Final   Value:        BLOOD CULTURE RECEIVED NO GROWTH TO DATE CULTURE WILL BE HELD FOR 5 DAYS BEFORE ISSUING A FINAL NEGATIVE REPORT     Performed at [facility]   Report Status PENDING   Incomplete    RADIOLOGY STUDIES/RESULTS: Ct Abdomen Pelvis Wo Contrast  08/10/2014   CLINICAL DATA:  Right flank and lower quadrant pain with vomiting  EXAM: CT ABDOMEN AND PELVIS WITHOUT CONTRAST  TECHNIQUE: Multidetector CT imaging of the abdomen and pelvis was performed following the standard protocol without IV contrast.  COMPARISON:  None.  FINDINGS: There is moderate right-sided hydronephrosis and hydroureter. Without obvious etiology. There is a calcification on image 82 of series 2 that likely reflects a  phlebolith. The right kidney is enlarged. There is a sub mm midpole calcification as well as lower pole calcification. The left kidney is normal. The perinephric fat on the right is mildly increased.  The liver, gallbladder, spleen, pancreas, nondistended stomach, adrenal glands, and abdominal aorta are normal. There are few normal sized pericaval lymph nodes. The small and large bowel exhibit no evidence of ileus nor of obstruction. The appendix is not discretely demonstrated. No pericecal inflammatory changes are demonstrated. The uterus is vertically oriented. It exhibits normal density. There is soft tissue fullness in the right adnexal region. There is no free pelvic fluid. There is no inguinal nor significant umbilical hernia.  The lung bases exhibit subsegmental atelectasis posteriorly. The lumbar spine and bony pelvis are unremarkable.  IMPRESSION: 1. There is enlargement of the right kidney with moderate hydronephrosis and hydroureter without definite evidence of an obstructing stone. Mildly increased density in the perinephric fat may reflect inflammation. Urologic evaluation is recommended. 2. There is no acute hepatobiliary nor acute bowel abnormality. 3. The uterus and adnexal structures exhibit no acute abnormalities.   Electronically Signed   By: David  Martinique   On: 08/10/2014 12:13   US Renal  08/11/2014   CLINICAL DATA:  Right hydronephrosis.  EXAM: RENAL/URINARY TRACT ULTRASOUND COMPLETE  COMPARISON:  CT scan of August 10, 2014.  FINDINGS: Right Kidney:  Length: 13.4 cm. No significant hydronephrosis is seen at this time. Echogenicity is within normal limits. Minimal amount of perinephric fluid is seen around the lower pole.  Left Kidney:  Length: 12.9 cm. Echogenicity within normal limits. No mass or hydronephrosis visualized.  Bladder:  Appears normal for degree of bladder distention. Bilateral ureteral jets are  noted.  IMPRESSION: Right-sided hydronephrosis noted on prior exam appears to have  nearly resolved. No significant renal abnormality is seen at this time.   Electronically Signed   By: Sabino Dick M.D.   On: 08/11/2014 07:50    Oren Binet, MD  [facility] Pager:[phone]  If 7PM-7AM, please contact night-coverage [email or web address] Password [email or web address] 08/12/2014, 9:53 AM   LOS: 2 days   **Disclaimer: This note may have been dictated with voice recognition software. Similar sounding words can inadvertently be transcribed and this note may contain transcription errors which may not have been corrected upon publication of note.**

## 2014-08-13 LAB — GLUCOSE, CAPILLARY
GLUCOSE-CAPILLARY: 123 mg/dL — AB (ref 70–99)
Glucose-Capillary: 119 mg/dL — ABNORMAL HIGH (ref 70–99)
Glucose-Capillary: 131 mg/dL — ABNORMAL HIGH (ref 70–99)
Glucose-Capillary: 123 mg/dL — ABNORMAL HIGH (ref 70–99)

## 2014-08-13 LAB — BASIC METABOLIC PANEL
Anion gap: 17 — ABNORMAL HIGH (ref 5–15)
BUN: 6 mg/dL (ref 6–23)
CO2: 19 mEq/L (ref 19–32)
Calcium: 8.7 mg/dL (ref 8.4–10.5)
Chloride: 103 mEq/L (ref 96–112)
Creatinine, Ser: 0.36 mg/dL — ABNORMAL LOW (ref 0.50–1.10)
GFR calc Af Amer: >90 mL/min (ref >90)
GFR calc non Af Amer: >90 mL/min (ref >90)
GLUCOSE: 125 mg/dL — AB (ref 70–99)
POTASSIUM: 3.3 meq/L — AB (ref 3.7–5.3)
SODIUM: 139 meq/L (ref 137–147)

## 2014-08-13 LAB — COMPREHENSIVE METABOLIC PANEL
ALT: 49 U/L — ABNORMAL HIGH (ref 0–35)
AST: 28 U/L (ref 0–37)
Albumin: 2.3 g/dL — ABNORMAL LOW (ref 3.5–5.2)
Alkaline Phosphatase: 289 U/L — ABNORMAL HIGH (ref 39–117)
Anion gap: 17 — ABNORMAL HIGH (ref 5–15)
BILIRUBIN TOTAL: 0.3 mg/dL (ref 0.3–1.2)
BUN: 6 mg/dL (ref 6–23)
CO2: 20 meq/L (ref 19–32)
Calcium: 8.6 mg/dL (ref 8.4–10.5)
Chloride: 100 mEq/L (ref 96–112)
Creatinine, Ser: 0.48 mg/dL — ABNORMAL LOW (ref 0.50–1.10)
GFR calc Af Amer: >90 mL/min (ref >90)
Glucose, Bld: 135 mg/dL — ABNORMAL HIGH (ref 70–99)
POTASSIUM: 2.8 meq/L — AB (ref 3.7–5.3)
Sodium: 137 mEq/L (ref 137–147)
Total Protein: 6.7 g/dL (ref 6.0–8.3)

## 2014-08-13 LAB — URINE CULTURE: Colony Count: >=100000

## 2014-08-13 MED ORDER — POTASSIUM CHLORIDE CRYS ER 20 MEQ PO TBCR
40.0000 meq | EXTENDED_RELEASE_TABLET | Freq: Once | ORAL | Status: AC
  Administered 2014-08-13: 40 meq via ORAL
  Filled 2014-08-13: qty 2

## 2014-08-13 MED ORDER — POTASSIUM CHLORIDE CRYS ER 20 MEQ PO TBCR: 40.0000 meq | EXTENDED_RELEASE_TABLET | Freq: Every day | ORAL | Status: DC

## 2014-08-13 MED ORDER — POTASSIUM CHLORIDE 10 MEQ/100ML IV SOLN
10.0000 meq | INTRAVENOUS | Status: AC
  Administered 2014-08-13 (×4): 10 meq via INTRAVENOUS
  Filled 2014-08-13 (×4): qty 100

## 2014-08-13 MED ORDER — LIVING WELL WITH DIABETES BOOK - IN SPANISH
Freq: Once | Status: AC
Start: 2014-08-13 — End: 2014-08-13
  Administered 2014-08-13: 22:00:00
  Filled 2014-08-13: qty 1

## 2014-08-13 NOTE — Progress Notes (Signed)
Lab called with a critical K+ value of 2.8. MD made aware.

## 2014-08-13 NOTE — Progress Notes (Signed)
PATIENT DETAILS Name: [name] Age: 33 y.o. Sex: female Date of Birth: 07-May-1981 Admit Date: 08/10/2014 Admitting Physician Modena Jansky, MD PCP:No PCP Per Patient  Subjective: No further flank pain. Feels much better  Assessment/Plan: SIR's  -likely secondary to pyelonephritis/?passed renal stone  -now afebrile,normal BP  -resolved with IV fluids and IV Antibiotics  -continue IV Rocephin (day 4)   Hydronephrosis  -possibly secondary to a passed stone  -CT showed moderate right hydronephrosis and hydroureter without definite evidence of an obstructing stone.  -Seen by Urology- repeat renal US ordered-hydronephroses has resolved. Plans were for cystoscopy/ ureteroscopy if hydronephrosis had not resolved   Pyelonephritis  - possibly secondary to hydronephrosis/hydroureter and possibly a passed renal stone  - leukocytosis- patient afebrile; complains of right sided flank and back pain-but significantly better than on admission  -urine culture-prelim-Proteus-pan senisitive pending, blood culture negative so far  -continue empiric IV rocephin (day 4)   Diabetes Mellitus  -Hgb A1c 11.6  -patient not known diabetic  -started CBG's stable, c/w Insulin 70/30-will start oral hypoglycemic agents on discharge  -diabetes education consult   Hypokalemia  -replete and recheck K in am  Transaminitis  -8/28- AST 103; normal ALT; alk phos increasing 246; lipase normal at 12  -No acute hepatobiliary abnormality on CT-suspect may be secondary to SIR's/Sepsis-LFT's better, Hepatitis serology negative -Follow as outpatient  Disposition: Remain inpatient-home in am  DVT Prophylaxis: Prophylactic Lovenox  Code Status: Full code   Family Communication Spouse at bedside  Procedures:  None  CONSULTS:  urology  MEDICATIONS: Scheduled Meds: . cefTRIAXone (ROCEPHIN)  IV  1 g Intravenous Q24H  . enoxaparin (LOVENOX) injection  40 mg Subcutaneous Q24H  .  insulin aspart  0-9 Units Subcutaneous TID WC  . insulin aspart protamine- aspart  15 Units Subcutaneous BID WC  . sodium chloride  3 mL Intravenous Q12H   Continuous Infusions:   PRN Meds:.acetaminophen, acetaminophen, albuterol, HYDROcodone-acetaminophen, HYDROmorphone (DILAUDID) injection, ondansetron (ZOFRAN) IV, ondansetron  Antibiotics: Anti-infectives   Start     Dose/Rate Route Frequency Ordered Stop   08/10/14 1700  cefTRIAXone (ROCEPHIN) 1 g in dextrose 5 % 50 mL IVPB     1 g 100 mL/hr over 30 Minutes Intravenous Every 24 hours 08/10/14 1654         PHYSICAL EXAM: Vital signs in last 24 hours: Filed Vitals:   08/12/14 1401 08/12/14 2106 08/13/14 0526 08/13/14 1344  BP: 120/68 118/70 127/75 118/69  Pulse: 81 84 65 84  Temp: 97.6 F (36.4 C) 100 F (37.8 C) 99 F (37.2 C) 98.3 F (36.8 C)  TempSrc: Oral Oral Oral Oral  Resp: 20 18 18 18   Height:      Weight:      SpO2: 99% 99% 99% 98%    Weight change:  Filed Weights   08/10/14 1825  Weight: 63.504 kg (140 lb)   Body mass index is 26.47 kg/(m^2).   Gen Exam: Awake and alert with clear speech.   Neck: Supple, No JVD.   Chest: B/L Clear.  No rales or rhonchi CVS: S1 S2 Regular, no murmurs.  Abdomen: soft, BS +, non tender, non distended. No CVA tenderness  Extremities: no edema, lower extremities warm to touch. Neurologic: Non Focal.   Skin: No Rash.   Wounds: N/A.    Intake/Output from previous day:  Intake/Output Summary (Last 24 hours) at 08/13/14 1806 Last data filed at 08/13/14 0900  Gross per 24  hour  Intake 1049.17 ml  Output      0 ml  Net 1049.17 ml     LAB RESULTS: CBC  Recent Labs Lab 08/10/14 1024 08/11/14 0720 08/12/14 0658  WBC 11.8* 13.2* 9.8  HGB 15.0 12.7 13.2  HCT 42.6 37.8 37.9  PLT 251 238 265  MCV 86.4 88.9 86.1  MCH 30.4 29.9 30.0  MCHC 35.2 33.6 34.8  RDW 12.6 13.1 13.2    Chemistries   Recent Labs Lab 08/10/14 1313 08/10/14 1752 08/11/14 0720  08/12/14 0658 08/13/14 0605  NA 141 138 137 137 137  K 2.8* 3.2* 3.9 3.2* 2.8*  CL 103 100 103 102 100  CO2 19 19 19  17* 20  GLUCOSE 246* 228* 224* 181* 135*  BUN 7 6 7 7 6   CREATININE 0.56 0.46* 0.49* 0.41* 0.48*  CALCIUM 8.4 8.6 8.7 9.0 8.6  MG  --  1.8  --   --   --     CBG:  Recent Labs Lab 08/12/14 1704 08/12/14 2105 08/13/14 0800 08/13/14 1215 08/13/14 1729  GLUCAP 132* 177* 119* 131* 123*    GFR Estimated Creatinine Clearance: 86.2 ml/min (by C-G formula based on Cr of 0.48).  Coagulation profile No results found for this basename: INR, PROTIME,  in the last 168 hours  Cardiac Enzymes No results found for this basename: CK, CKMB, TROPONINI, MYOGLOBIN,  in the last 168 hours  No components found with this basename: POCBNP,  No results found for this basename: DDIMER,  in the last 72 hours No results found for this basename: HGBA1C,  in the last 72 hours No results found for this basename: CHOL, HDL, LDLCALC, TRIG, CHOLHDL, LDLDIRECT,  in the last 72 hours No results found for this basename: TSH, T4TOTAL, FREET3, T3FREE, THYROIDAB,  in the last 72 hours No results found for this basename: VITAMINB12, FOLATE, FERRITIN, TIBC, IRON, RETICCTPCT,  in the last 72 hours No results found for this basename: LIPASE, AMYLASE,  in the last 72 hours  Urine Studies No results found for this basename: UACOL, UAPR, USPG, UPH, UTP, UGL, UKET, UBIL, UHGB, UNIT, UROB, ULEU, UEPI, UWBC, URBC, UBAC, CAST, CRYS, UCOM, BILUA,  in the last 72 hours  MICROBIOLOGY: Recent Results (from the past 240 hour(s))  URINE CULTURE     Status: None   Collection Time    08/10/14 10:12 AM      Result Value Ref Range Status   Specimen Description URINE, RANDOM   Final   Special Requests ADDED [id number]   Final   Culture  Setup Time     Final   Value: 08/10/2014 22:53     Performed at [place]     Final   Value: >=100,000 COLONIES/ML     Performed at [facility]   Culture     Final   Value: PROTEUS MIRABILIS     Performed at [facility]   Report Status 08/13/2014 FINAL   Final   Organism ID, Bacteria PROTEUS MIRABILIS   Final  CULTURE, BLOOD (ROUTINE X 2)     Status: None   Collection Time    08/10/14  5:40 PM      Result Value Ref Range Status   Specimen Description BLOOD HAND RIGHT   Final   Special Requests BOTTLES DRAWN AEROBIC AND ANAEROBIC 3CC   Final   Culture  Setup Time     Final   Value: 08/10/2014 23:27  Performed at [facility]     Final   Value:        BLOOD CULTURE RECEIVED NO GROWTH TO DATE CULTURE WILL BE HELD FOR 5 DAYS BEFORE ISSUING A FINAL NEGATIVE REPORT     Performed at [facility]   Report Status PENDING   Incomplete  CULTURE, BLOOD (ROUTINE X 2)     Status: None   Collection Time    08/10/14  5:40 PM      Result Value Ref Range Status   Specimen Description BLOOD HAND LEFT   Final   Special Requests BOTTLES DRAWN AEROBIC ONLY 3CC   Final   Culture  Setup Time     Final   Value: 08/10/2014 23:26     Performed at [facility]   Culture     Final   Value:        BLOOD CULTURE RECEIVED NO GROWTH TO DATE CULTURE WILL BE HELD FOR 5 DAYS BEFORE ISSUING A FINAL NEGATIVE REPORT     Performed at [facility]   Report Status PENDING   Incomplete    RADIOLOGY STUDIES/RESULTS: Ct Abdomen Pelvis Wo Contrast  08/10/2014   CLINICAL DATA:  Right flank and lower quadrant pain with vomiting  EXAM: CT ABDOMEN AND PELVIS WITHOUT CONTRAST  TECHNIQUE: Multidetector CT imaging of the abdomen and pelvis was performed following the standard protocol without IV contrast.  COMPARISON:  None.  FINDINGS: There is moderate right-sided hydronephrosis and hydroureter. Without obvious etiology. There is a calcification on image 82 of series 2 that likely reflects a phlebolith. The right kidney is enlarged. There is a sub mm midpole calcification as well as lower pole calcification.  The left kidney is normal. The perinephric fat on the right is mildly increased.  The liver, gallbladder, spleen, pancreas, nondistended stomach, adrenal glands, and abdominal aorta are normal. There are few normal sized pericaval lymph nodes. The small and large bowel exhibit no evidence of ileus nor of obstruction. The appendix is not discretely demonstrated. No pericecal inflammatory changes are demonstrated. The uterus is vertically oriented. It exhibits normal density. There is soft tissue fullness in the right adnexal region. There is no free pelvic fluid. There is no inguinal nor significant umbilical hernia.  The lung bases exhibit subsegmental atelectasis posteriorly. The lumbar spine and bony pelvis are unremarkable.  IMPRESSION: 1. There is enlargement of the right kidney with moderate hydronephrosis and hydroureter without definite evidence of an obstructing stone. Mildly increased density in the perinephric fat may reflect inflammation. Urologic evaluation is recommended. 2. There is no acute hepatobiliary nor acute bowel abnormality. 3. The uterus and adnexal structures exhibit no acute abnormalities.   Electronically Signed   By: David  Martinique   On: 08/10/2014 12:13   US Renal  08/11/2014   CLINICAL DATA:  Right hydronephrosis.  EXAM: RENAL/URINARY TRACT ULTRASOUND COMPLETE  COMPARISON:  CT scan of August 10, 2014.  FINDINGS: Right Kidney:  Length: 13.4 cm. No significant hydronephrosis is seen at this time. Echogenicity is within normal limits. Minimal amount of perinephric fluid is seen around the lower pole.  Left Kidney:  Length: 12.9 cm. Echogenicity within normal limits. No mass or hydronephrosis visualized.  Bladder:  Appears normal for degree of bladder distention. Bilateral ureteral jets are noted.  IMPRESSION: Right-sided hydronephrosis noted on prior exam appears to have nearly resolved. No significant renal abnormality is seen at this time.   Electronically Signed   By: Jeneen Rinks  Green  M.D.   On: 08/11/2014 07:50    Oren Binet, MD  [facility] Pager:[phone]  If 7PM-7AM, please contact night-coverage [email or web address] Password [email or web address] 08/13/2014, 6:06 PM   LOS: 3 days   **Disclaimer: This note may have been dictated with voice recognition software. Similar sounding words can inadvertently be transcribed and this note may contain transcription errors which may not have been corrected upon publication of note.**

## 2014-08-14 LAB — BASIC METABOLIC PANEL
Anion gap: 18 — ABNORMAL HIGH (ref 5–15)
BUN: 9 mg/dL (ref 6–23)
CHLORIDE: 105 meq/L (ref 96–112)
CO2: 19 mEq/L (ref 19–32)
Calcium: 9.2 mg/dL (ref 8.4–10.5)
Creatinine, Ser: 0.45 mg/dL — ABNORMAL LOW (ref 0.50–1.10)
GFR calc Af Amer: >90 mL/min (ref >90)
GFR calc non Af Amer: >90 mL/min (ref >90)
GLUCOSE: 121 mg/dL — AB (ref 70–99)
POTASSIUM: 3.4 meq/L — AB (ref 3.7–5.3)
SODIUM: 142 meq/L (ref 137–147)

## 2014-08-14 LAB — GLUCOSE, CAPILLARY: GLUCOSE-CAPILLARY: 115 mg/dL — AB (ref 70–99)

## 2014-08-14 MED ORDER — INSULIN STARTER KIT- SYRINGES (ENGLISH)
1.0000 | Freq: Once | Status: AC
  Administered 2014-08-14: 1
  Filled 2014-08-14: qty 1

## 2014-08-14 MED ORDER — CEPHALEXIN 500 MG PO CAPS: 500.0000 mg | ORAL_CAPSULE | Freq: Four times a day (QID) | ORAL | Status: DC

## 2014-08-14 MED ORDER — ALBUTEROL SULFATE (2.5 MG/3ML) 0.083% IN NEBU
2.5000 mg | INHALATION_SOLUTION | RESPIRATORY_TRACT | Status: DC | PRN
Start: 2014-08-14 — End: 2014-08-14

## 2014-08-14 MED ORDER — DEXTROSE 5 % IV SOLN
1.0000 g | INTRAVENOUS | Status: DC
  Administered 2014-08-14: 1 g via INTRAVENOUS
  Filled 2014-08-14: qty 10

## 2014-08-14 MED ORDER — METFORMIN HCL 500 MG PO TABS: 500.0000 mg | ORAL_TABLET | Freq: Two times a day (BID) | ORAL | Status: DC

## 2014-08-14 MED ORDER — POTASSIUM CHLORIDE CRYS ER 20 MEQ PO TBCR
40.0000 meq | EXTENDED_RELEASE_TABLET | Freq: Once | ORAL | Status: AC
  Administered 2014-08-14: 40 meq via ORAL
  Filled 2014-08-14: qty 2

## 2014-08-14 MED ORDER — CEPHALEXIN 500 MG PO CAPS
500.0000 mg | ORAL_CAPSULE | Freq: Four times a day (QID) | ORAL | Status: DC
  Filled 2014-08-14 (×2): qty 1

## 2014-08-14 MED ORDER — INSULIN NPH ISOPHANE & REGULAR (70-30) 100 UNIT/ML ~~LOC~~ SUSP: 15.0000 [IU] | Freq: Two times a day (BID) | SUBCUTANEOUS | Status: DC

## 2014-08-14 MED ORDER — GLUCOSE BLOOD VI STRP: ORAL_STRIP | Status: DC

## 2014-08-14 MED ORDER — FREESTYLE SYSTEM KIT: 1.0000 | PACK | Status: DC | PRN

## 2014-08-14 MED ORDER — "INSULIN SYRINGE-NEEDLE U-100 30G X 1/2"" 0.5 ML MISC": Status: DC

## 2014-08-14 NOTE — Progress Notes (Signed)
Nsg Discharge Note  Admit Date:  08/10/2014 Discharge date: 08/14/2014   [name] to be D/C'd Home per MD order.  AVS completed.  Copy for chart, and copy for patient signed, and dated. Patient/caregiver able to verbalize understanding.  Discharge Medication:   Medication List    STOP taking these medications       ibuprofen 200 MG tablet  Commonly known as:  ADVIL,MOTRIN      TAKE these medications       cephALEXin 500 MG capsule  Commonly known as:  KEFLEX  Take 1 capsule (500 mg total) by mouth 4 (four) times daily.  Start taking on:  08/15/2014     glucose blood test strip  Use as instructed     glucose monitoring kit monitoring kit  1 each by Does not apply route as needed for other.     insulin NPH-regular Human (70-30) 100 UNIT/ML injection  Commonly known as:  NOVOLIN 70/30  Inject 15 Units into the skin 2 (two) times daily with a meal.     Insulin Syringe-Needle U-100 30G X 1/2" 0.5 ML Misc  Use as directed     medroxyPROGESTERone 150 MG/ML injection  Commonly known as:  DEPO-PROVERA  Inject 150 mg into the muscle every 3 (three) months.     metFORMIN 500 MG tablet  Commonly known as:  GLUCOPHAGE  Take 1 tablet (500 mg total) by mouth 2 (two) times daily with a meal.        Discharge Assessment: Filed Vitals:   08/14/14 0547  BP: 111/66  Pulse: 85  Temp: 99.6 F (37.6 C)  Resp: 16   Skin clean, dry and intact without evidence of skin break down, no evidence of skin tears noted. IV catheter discontinued intact. Site without signs and symptoms of complications - no redness or edema noted at insertion site, patient denies c/o pain - only slight tenderness at site.  Dressing with slight pressure applied.  D/c Instructions-Education: Discharge instructions given to patient/family with verbalized understanding. D/c education completed with patient/family including follow up instructions, medication list, d/c activities limitations if indicated,  with other d/c instructions as indicated by MD - patient able to verbalize understanding, all questions fully answered. Patient instructed to return to ED, call 911, or call MD for any changes in condition.  Patient escorted via [place], and D/C home via private auto.  Dayle Points, RN 08/14/2014 12:40 PM

## 2014-08-14 NOTE — Discharge Summary (Signed)
Physician Discharge Summary  [name] PHK:[id number] DOB: May 17, 1981 DOA: 08/10/2014  PCP: No PCP Per Patient  Admit date: 08/10/2014 Discharge date: 08/14/2014  Time spent: 40 minutes  Recommendations for Outpatient Follow-up:  1. Patient with new onset diabetes mellitus.  Placed on Novolin 70/30 and Metformin 500 mg both twice daily.  2.  BMET to follow on  hypokalemia and transaminitis.   Discharge Diagnoses:  Principal Problem:   Hydronephrosis Active Problems:   Hypokalemia   Nausea and vomiting   Dehydration   Pyelonephritis, acute   Hyperglycemia   Discharge Condition: Stable    Diet recommendation: Carb modified diet  Filed Weights   08/10/14 1825  Weight: 63.504 kg (140 lb)    History of present illness:  [name] is a 33 y.o. female with no Crawford presented to the ED with right-sided abdominal pain for past 5 days. She stated she had an ongoing headache and generalized body and woke up with severe right sided abdominal pain which radiates to the back.   She had associated low urine output, and 3 episodes of nausea, and nonbloody emesis except during the last episode she noticed a streak of blood. LMP was 04/26/2014 and is on contraceptives; and LBM 4 days ago. She denied changes to her bowels, hematuria, or dysuria. In the ED, she was septic, and CT of abdomen and pelvis with evidence of right moderate hydronephrosis and mild increased density in the perinephric fat which may reflect inflammation. She was started on IV fluids and IV rocephin, which was continued for 5 days.   Hospital Course:  SIR's  -Initially  febrile and hypotensive; resolved with IV fluids and IV antibiotics given in the ED.  Thought secondary to pyelonephritis/ or passed renal stone.  On discharge, afebrile and with normal BP.    Hydronephrosis  -possibly secondary to a passed stone; CT-showed moderate right hydronephrosis and hydroureter without definite evidence of an  obstructing stone. Repeat renal US- showed that hydronephroses has resolved.   Pyelonephritis  -Possibly secondary to hydronephrosis/hydroureter and possibly a passed renal stone.  -Patient afebrile, no leukocytosis, resolved CVA tenderness  -Was on IV Rocephin for four days, and will be discharged on Keflex for 10 days ( sensitive for Proteus) .  This completes a 14 day antibiotic treatment plan. -Urine culture-Proteus.  Blood cultures negative on discharge    Diabetes Mellitus  -patient not known diabetic, and Hgb A1c was 11.6;  Was placed on insulin 70/30 and SSI.   -On discharge, CBG stable; will discharge on Novolin 70/30 15 units twice daily with meals.  Also, metformin 500 mg twice daily.  Patient was educated about diabetes; and is discharged after we confirmed that she will be able to give herself the insulin injections at home.   -[facility] for management of diabetes  Hypokalemia  - minimal; will give  40 meq of oral potassium before discharge -Follow up with PCP  Transaminitis  -Alk phos increased to 289; ALT trended down to 49; normal AST and lipase, and negative hepatitis serology. CT scan without evidence of acute hepatobiliary abnormality.   Suspect may be secondary to SIR's/Sepsis- -Follow up  [facility] at [facility]  Procedures:  None  Consultations:  None  Discharge Exam: Filed Vitals:   08/14/14 0547  BP: 111/66  Pulse: 85  Temp: 99.6 F (37.6 C)  Resp: 16     Exam General: Well-developed and nourished. Sitting in the  chari Eyes: Anicteric account.  Cardiovascular: Regular rate and rhythm.  No murmurs, rubs, or gallops. Respiratory: Clear to auscultate bilaterally.  No rhonchi or crepitations. Abdomen: Soft nontender bowel sounds present. No guarding or rigidity. No CVA tenderness Musculoskeletal: No edema.  Psychiatric: Appears normal.  Neurologic: Alert awake oriented to time place and  person.    Discharge Instructions You were cared for by a hospitalist during your hospital stay. If you have any questions about your discharge medications or the care you received while you were in the hospital after you are discharged, you can call the unit and asked to speak with the hospitalist on call if the hospitalist that took care of you is not available. Once you are discharged, your primary care physician will handle any further medical issues. Please note that NO REFILLS for any discharge medications will be authorized once you are discharged, as it is imperative that you return to your primary care physician (or establish a relationship with a primary care physician if you do not have one) for your aftercare needs so that they can reassess your need for medications and monitor your lab values.      Discharge Instructions   Ambulatory referral to Nutrition and Diabetic Education    Complete by:  As directed   New onset=A1C-11.6%.  To follow-up with [facility] and renew orange card.  Spanish speaking     Call MD for:  difficulty breathing, headache or visual disturbances    Complete by:  As directed      Call MD for:  persistant dizziness or light-headedness    Complete by:  As directed      Diet Carb Modified    Complete by:  As directed      Increase activity slowly    Complete by:  As directed             Medication List    STOP taking these medications       ibuprofen 200 MG tablet  Commonly known as:  ADVIL,MOTRIN      TAKE these medications       cephALEXin 500 MG capsule  Commonly known as:  KEFLEX  Take 1 capsule (500 mg total) by mouth 4 (four) times daily.  Start taking on:  08/15/2014     glucose blood test strip  Use as instructed     glucose monitoring kit monitoring kit  1 each by Does not apply route as needed for other.     insulin NPH-regular Human (70-30) 100 UNIT/ML injection  Commonly known as:  NOVOLIN 70/30  Inject 15 Units into the skin 2 (two)  times daily with a meal.     Insulin Syringe-Needle U-100 30G X 1/2" 0.5 ML Misc  Use as directed     medroxyPROGESTERone 150 MG/ML injection  Commonly known as:  DEPO-PROVERA  Inject 150 mg into the muscle every 3 (three) months.     metFORMIN 500 MG tablet  Commonly known as:  GLUCOPHAGE  Take 1 tablet (500 mg total) by mouth 2 (two) times daily with a meal.       No Known Allergies Follow-up Information   Follow up with [facility]     On 08/16/2014. (5 pm for hospital follow up)    Contact information:   [place] [phone]      Follow up with Arvil Persons, MD. Schedule an appointment as soon as possible for a  visit in 1 week.   Specialty:  Urology   Contact information:   [place] [id number] [phone]        The results of significant diagnostics from this hospitalization (including imaging, microbiology, ancillary and laboratory) are listed below for reference.    Significant Diagnostic Studies: Ct Abdomen Pelvis Wo Contrast  08/10/2014   CLINICAL DATA:  Right flank and lower quadrant pain with vomiting  EXAM: CT ABDOMEN AND PELVIS WITHOUT CONTRAST  TECHNIQUE: Multidetector CT imaging of the abdomen and pelvis was performed following the standard protocol without IV contrast.  COMPARISON:  None.  FINDINGS: There is moderate right-sided hydronephrosis and hydroureter. Without obvious etiology. There is a calcification on image 82 of series 2 that likely reflects a phlebolith. The right kidney is enlarged. There is a sub mm midpole calcification as well as lower pole calcification. The left kidney is normal. The perinephric fat on the right is mildly increased.  The liver, gallbladder, spleen, pancreas, nondistended stomach, adrenal glands, and abdominal aorta are normal. There are few normal sized pericaval lymph nodes. The small and large bowel exhibit no evidence of ileus nor of  obstruction. The appendix is not discretely demonstrated. No pericecal inflammatory changes are demonstrated. The uterus is vertically oriented. It exhibits normal density. There is soft tissue fullness in the right adnexal region. There is no free pelvic fluid. There is no inguinal nor significant umbilical hernia.  The lung bases exhibit subsegmental atelectasis posteriorly. The lumbar spine and bony pelvis are unremarkable.  IMPRESSION: 1. There is enlargement of the right kidney with moderate hydronephrosis and hydroureter without definite evidence of an obstructing stone. Mildly increased density in the perinephric fat may reflect inflammation. Urologic evaluation is recommended. 2. There is no acute hepatobiliary nor acute bowel abnormality. 3. The uterus and adnexal structures exhibit no acute abnormalities.   Electronically Signed   By: David  Martinique   On: 08/10/2014 12:13   US Renal  08/11/2014   CLINICAL DATA:  Right hydronephrosis.  EXAM: RENAL/URINARY TRACT ULTRASOUND COMPLETE  COMPARISON:  CT scan of August 10, 2014.  FINDINGS: Right Kidney:  Length: 13.4 cm. No significant hydronephrosis is seen at this time. Echogenicity is within normal limits. Minimal amount of perinephric fluid is seen around the lower pole.  Left Kidney:  Length: 12.9 cm. Echogenicity within normal limits. No mass or hydronephrosis visualized.  Bladder:  Appears normal for degree of bladder distention. Bilateral ureteral jets are noted.  IMPRESSION: Right-sided hydronephrosis noted on prior exam appears to have nearly resolved. No significant renal abnormality is seen at this time.   Electronically Signed   By: Sabino Dick M.D.   On: 08/11/2014 07:50    Microbiology: Recent Results (from the past 240 hour(s))  URINE CULTURE     Status: None   Collection Time    08/10/14 10:12 AM      Result Value Ref Range Status   Specimen Description URINE, RANDOM   Final   Special Requests ADDED [id number]   Final   Culture   Setup Time     Final   Value: 08/10/2014 22:53     Performed at [place]     Final   Value: >=100,000 COLONIES/ML     Performed at [facility]   Culture     Final   Value: PROTEUS MIRABILIS     Performed at [facility]   Report Status 08/13/2014 FINAL  Final   Organism ID, Bacteria PROTEUS MIRABILIS   Final  CULTURE, BLOOD (ROUTINE X 2)     Status: None   Collection Time    08/10/14  5:40 PM      Result Value Ref Range Status   Specimen Description BLOOD HAND RIGHT   Final   Special Requests BOTTLES DRAWN AEROBIC AND ANAEROBIC 3CC   Final   Culture  Setup Time     Final   Value: 08/10/2014 23:27     Performed at [facility]   Culture     Final   Value:        BLOOD CULTURE RECEIVED NO GROWTH TO DATE CULTURE WILL BE HELD FOR 5 DAYS BEFORE ISSUING A FINAL NEGATIVE REPORT     Performed at [facility]   Report Status PENDING   Incomplete  CULTURE, BLOOD (ROUTINE X 2)     Status: None   Collection Time    08/10/14  5:40 PM      Result Value Ref Range Status   Specimen Description BLOOD HAND LEFT   Final   Special Requests BOTTLES DRAWN AEROBIC ONLY 3CC   Final   Culture  Setup Time     Final   Value: 08/10/2014 23:26     Performed at [facility]   Culture     Final   Value:        BLOOD CULTURE RECEIVED NO GROWTH TO DATE CULTURE WILL BE HELD FOR 5 DAYS BEFORE ISSUING A FINAL NEGATIVE REPORT     Performed at [facility]   Report Status PENDING   Incomplete     Labs: Basic Metabolic Panel:  Recent Labs Lab 08/10/14 1313 08/10/14 1752 08/11/14 0720 08/12/14 4174 08/13/14 0605 08/13/14 1851 08/14/14 0620  NA 141 138 137 137 137 139 142  K 2.8* 3.2* 3.9 3.2* 2.8* 3.3* 3.4*  CL 103 100 103 102 100 103 105  CO2 19 19 19  17* 20 19 19   GLUCOSE 246* 228* 224* 181* 135* 125* 121*  BUN 7 6 7 7 6 6 9   CREATININE 0.56 0.46* 0.49* 0.41* 0.48* 0.36* 0.45*  CALCIUM 8.4 8.6 8.7 9.0 8.6 8.7 9.2  MG   --  1.8  --   --   --   --   --    Liver Function Tests:  Recent Labs Lab 08/10/14 1024 08/10/14 1313 08/11/14 0720 08/12/14 0658 08/13/14 0605  AST 55* 45* 103* 48* 28  ALT 38* 36* 54* 64* 49*  ALKPHOS 187* 165* 246* 242* 289*  BILITOT 0.7 0.8 1.3* 0.5 0.3  PROT 8.3 7.2 7.0 7.4 6.7  ALBUMIN 3.3* 2.8* 2.6* 2.4* 2.3*    Recent Labs Lab 08/10/14 1752  LIPASE 12   No results found for this basename: AMMONIA,  in the last 168 hours CBC:  Recent Labs Lab 08/10/14 1024 08/11/14 0720 08/12/14 0658  WBC 11.8* 13.2* 9.8  HGB 15.0 12.7 13.2  HCT 42.6 37.8 37.9  MCV 86.4 88.9 86.1  PLT 251 238 265   Cardiac Enzymes: No results found for this basename: CKTOTAL, CKMB, CKMBINDEX, TROPONINI,  in the last 168 hours BNP: BNP (last 3 results) No results found for this basename: PROBNP,  in the last 8760 hours CBG:  Recent Labs Lab 08/13/14 0800 08/13/14 1215 08/13/14 1729 08/13/14 2226 08/14/14 0752  GLUCAP 119* 131* 123* 123* 115*    Signed:  Lacy Duverney PA-C  [facility] 08/14/2014, 11:29 AM  Attending Patient was seen, examined,treatment plan was discussed with the Physician extender. I have directly reviewed the clinical findings, lab, imaging studies and management of this patient in detail. I have made the necessary changes to the above noted documentation, and agree with the documentation, as recorded by the Physician extender.  Nena Alexander MD [facility].

## 2014-08-14 NOTE — Care Management Note (Signed)
    Page 1 of 1   08/14/2014     2:45:20 PM CARE MANAGEMENT NOTE 08/14/2014  Patient:  [name], [name]   Account Number:  [id number]  Date Initiated:  08/14/2014  Documentation initiated by:  Tomi Bamberger  Subjective/Objective Assessment:   dx hydronephrosis  admit- lives with family. pta indep.     Action/Plan:   Anticipated DC Date:  08/14/2014   Anticipated DC Plan:  [place]  CM consult  [facility]      Choice offered to / List presented to:             Status of service:  Completed, signed off Medicare Important Message given?  NO (If response is "NO", the following Medicare IM given date fields will be blank) Date Medicare IM given:   Medicare IM given by:   Date Additional Medicare IM given:   Additional Medicare IM given by:    Discharge Disposition:  HOME/SELF CARE  Per UR Regulation:  Reviewed for med. necessity/level of care/duration of stay  If discussed at [place] of Stay Meetings, dates discussed:    Comments:  08/14/14 [place], BSN [phone] patient lives with family, pta indep.  Patient states needs ast with medications, NCM assisted patient with Match Letter.  Patient also has hosp follow up aptt at [facility].  NCM gave patient reli-on information for  insulin meter and strips.  Patient was informed to go to [facility] clinic to use Match, because did not have $10 for insulin.

## 2014-08-16 ENCOUNTER — Ambulatory Visit: Payer: Self-pay

## 2014-08-16 ENCOUNTER — Encounter: Payer: Self-pay | Admitting: Internal Medicine

## 2014-08-16 ENCOUNTER — Ambulatory Visit: Payer: Self-pay | Attending: Internal Medicine | Admitting: Internal Medicine

## 2014-08-16 VITALS — BP 114/74 | HR 79 | Temp 98.0°F | Resp 16 | Ht 61.0 in | Wt 131.0 lb

## 2014-08-16 DIAGNOSIS — E1165 Type 2 diabetes mellitus with hyperglycemia: Secondary | ICD-10-CM

## 2014-08-16 DIAGNOSIS — Z794 Long term (current) use of insulin: Secondary | ICD-10-CM | POA: Insufficient documentation

## 2014-08-16 DIAGNOSIS — R739 Hyperglycemia, unspecified: Secondary | ICD-10-CM

## 2014-08-16 DIAGNOSIS — N1330 Unspecified hydronephrosis: Secondary | ICD-10-CM

## 2014-08-16 DIAGNOSIS — R7309 Other abnormal glucose: Secondary | ICD-10-CM

## 2014-08-16 DIAGNOSIS — E119 Type 2 diabetes mellitus without complications: Secondary | ICD-10-CM | POA: Insufficient documentation

## 2014-08-16 DIAGNOSIS — R51 Headache: Secondary | ICD-10-CM | POA: Insufficient documentation

## 2014-08-16 DIAGNOSIS — IMO0001 Reserved for inherently not codable concepts without codable children: Secondary | ICD-10-CM

## 2014-08-16 LAB — CULTURE, BLOOD (ROUTINE X 2)
CULTURE: NO GROWTH
CULTURE: NO GROWTH

## 2014-08-16 LAB — GLUCOSE, POCT (MANUAL RESULT ENTRY): POC Glucose: 119 mg/dl — AB (ref 70–99)

## 2014-08-16 MED ORDER — INSULIN NPH ISOPHANE & REGULAR (70-30) 100 UNIT/ML ~~LOC~~ SUSP: 15.0000 [IU] | Freq: Two times a day (BID) | SUBCUTANEOUS | Status: DC

## 2014-08-16 MED ORDER — METFORMIN HCL 1000 MG PO TABS: 1000.0000 mg | ORAL_TABLET | Freq: Two times a day (BID) | ORAL | Status: DC

## 2014-08-16 NOTE — Patient Instructions (Signed)
Hipoglucemia (Hypoglycemia) La hipoglucemia se produce cuando el nivel de glucosa en la sangre es demasiado bajo. La glucosa es un tipo de azcar, que es la principal fuente de energa del cuerpo. Hormonas, como la insulina y Secretary/administrator, Probation officer el nivel de glucosa en la [place]. La insulina reduce el nivel de la glucosa en la sangre, mientras que el glucagn lo [place]. Si tiene demasiada insulina en el torrente sanguneo o si no ingiere suficientes alimentos que contengan azcar, puede desarrollar hipoglucemia. Esta afeccin puede manifestarse en personas con o sin diabetes. Puede desarrollarse rpidamente y, como consecuencia, necesitar atencin urgente.  CAUSAS   Omitir o retrasar comidas.  No ingerir demasiados carbohidratos en las comidas.  Consumo excesivo de medicamentos para la diabetes.  No coordinar el horario de la toma de medicamentos por va oral para la diabetes o de insulina, con las comidas, las colaciones y Adult nurse.  Nuseas y vmitos.  Algunos medicamentos.  Enfermedades graves, como hepatitis, trastornos renales y ciertos trastornos de Youth worker.  Aumento de la actividad fsica o el ejercicio, sin ingerir alimentos adicionales o ajustar los medicamentos.  Beber alcohol en exceso.  Un trastorno nervioso que afecta las funciones corporales, como la frecuencia cardaca, presin arterial y digestin (neuropata [place]).  Una afeccin en la cual los msculos del estmago no funcionan apropiadamente (gastroparesia). Por consiguiente, los medicamentos y los alimentos no pueden absorberse Personal assistant.  Pocas veces un tumor de pncreas puede producir demasiada insulina. SNTOMAS   Hambre.  Sudoraciones (diaforesis).  Cambio en la Firefighter.  Temblores.  Dolor de [place].  Ansiedad.  Aturdimiento.  Irritabilidad.  Dificultad para concentrarse.  Sequedad en la boca.  Hormigueo o adormecimiento de las manos y los pies.  Sueo  agitado o alteraciones del sueo.  Alteracin en el habla y la coordinacin.  Cambio en el estado mental.  Convulsiones breves o prolongadas.  Agresividad  Somnolencia (letargo).  Debilidad.  Aumento de la frecuencia cardaca o palpitaciones.  Confusin.  Piel plida o de [employer].  Visin borrosa o doble.  Desmayos. DIAGNSTICO  Le harn un examen fsico y [place] historia clnica. Su mdico puede hacer un diagnstico en funcin de sus sntomas. Pueden realizarle anlisis de sangre y otras pruebas de laboratorio para Physicist, medical diagnstico. Una vez realizado el diagnstico, su mdico observar si los signos y sntomas desaparecen, una vez que aumenta el nivel de la glucosa en la [place].  TRATAMIENTO  Por lo general, la hipoglucemia puede tratarse fcilmente cuando se observan sntomas.  Controle su nivel de glucosa en la sangre. Si es menor que 70 mg/dl, tome uno de los siguientes:  3 o 4 comprimidos de glucosa.   taza de jugo.   taza de una gaseosa comn.  [place]   a 1 pomo de glucosa en gel.  5 a 6 caramelos duros.  Evite las bebidas o los alimentos con alto contenido de grasa, que pueden retrasar el aumento de los niveles de glucosa en la [place].  No ingiera ms de la cantidad recomendada de alimentos, bebidas, gel o comprimidos que contengan azcar. Si lo hace, el nivel de glucosa en la sangre subir demasiado.  Espere de 10 a 15 minutos y vuelva a Chief Technology Officer su nivel de glucosa en la sangre. Si an es Scientist, product/process development 70 mg/dl o est por debajo del intervalo indicado, repita el tratamiento.  Ingiera una colacin si falta ms de 1 hora para su prxima comida. Es posible que, [place], [place]  nivel de glucosa en la sangre baje demasiado, de modo que no pueda tratarse en su casa, cuando comience a observar los sntomas. Probablemente necesite ayuda. Incluso puede desmayarse o ser incapaz de tragar. Si no puede tratarse por s solo, alguien deber  llevarlo al hospital.  INSTRUCCIONES PARA EL CUIDADO EN EL HOGAR  Si tiene diabetes, siga su plan de control de la diabetes:  Tome los medicamentos segn las indicaciones.  Siga el plan de ejercicio.  Siga el plan de comidas. No saltee comidas. Coma a horario.  Controle su nivel de glucosa en la sangre peridicamente. Controle su nivel de glucosa en la sangre antes y despus de ejercitarse. Si hace ejercicio durante ms tiempo o de manera diferente de lo habitual, asegrese de controlar su nivel de glucosa en la sangre con mayor frecuencia.  Use su pulsera o medalla de alerta mdica, que indica que usted tiene diabetes.  Identifique la causa de su hipoglucemia. Luego, desarrolle formas de prevenir la recurrencia de la hipoglucemia.  No tome un bao o una ducha caliente inmediatamente despus de una inyeccin de insulina.  Siempre lleve consigo el tratamiento. Las pastillas de glucosa son fciles de llevar.  Si va a beber alcohol, bbalo solo con las comidas.  Informe a familiares y amigos qu pueden hacer para mantenerlo seguro durante una convulsin. Esto puede incluir retirar objetos duros o filosos del rea o colocarlo de costado.  Mantenga un peso saludable. SOLICITE ATENCIN MDICA SI:   Tiene problemas para mantener el nivel de glucosa en la sangre dentro del intervalo indicado.  Tiene episodios frecuentes de hipoglucemia.  Siente efectos secundarios por los medicamentos prescritos.  No est seguro por qu su nivel de glucosa en la sangre es tan bajo.  Nota cambios o un nuevo problema en la visin . SOLICITE ATENCIN MDICA DE INMEDIATO SI:   Presenta confusin.  Se produce un cambio en su estado mental.  Es incapaz de tragar.  Se desmaya. Document Released: 12/01/2005 Document Revised: 12/06/2013 ExitCare Patient Information 2015 [employer]. This information is not intended to replace advice given to you by your health care provider. Make sure you discuss  any questions you have with your health care provider.  

## 2014-08-16 NOTE — Progress Notes (Signed)
HFU Pt recently diagnosed with diabetes. Pt reports having pain in her mid back since last Thursday.

## 2014-08-16 NOTE — Progress Notes (Signed)
Patient ID: [name], female   DOB: 1981/06/30, 33 y.o.   MRN: [id number]  IYM:[id number]  HWK:[id number]  DOB - 1981/01/20  CC:  Chief Complaint  Patient presents with  . Establish Care  . Hospitalization Follow-up       HPI: [name] is a 33 y.o. female here today to establish medical care.  Patient presents today as a hospital follow up as a new diagnosis of DM and resolved hydronephrosis.  She was found to have a hemoglobin a1c of 11.6 and given metformin and insulin. Blood meter reveals BS of 80-156.  She denies all c/o today.   No Known Allergies Past Medical History  Diagnosis Date  . Headache     "monthly" (08/10/2014)  . Diabetes mellitus without complication    Current Outpatient Prescriptions on File Prior to Visit  Medication Sig Dispense Refill  . cephALEXin (KEFLEX) 500 MG capsule Take 1 capsule (500 mg total) by mouth 4 (four) times daily.  40 capsule  0  . glucose blood test strip Use as instructed  100 each  0  . glucose monitoring kit (FREESTYLE) monitoring kit 1 each by Does not apply route as needed for other.  1 each  0  . insulin NPH-regular Human (NOVOLIN 70/30) (70-30) 100 UNIT/ML injection Inject 15 Units into the skin 2 (two) times daily with a meal.  10 mL  0  . Insulin Syringe-Needle U-100 30G X 1/2" 0.5 ML MISC Use as directed  10 each  0  . medroxyPROGESTERone (DEPO-PROVERA) 150 MG/ML injection Inject 150 mg into the muscle every 3 (three) months.      . metFORMIN (GLUCOPHAGE) 500 MG tablet Take 1 tablet (500 mg total) by mouth 2 (two) times daily with a meal.  30 tablet  0   No current facility-administered medications on file prior to visit.   History reviewed. No pertinent family history. History   Social History  . Marital Status: Married    Spouse Name: N/A    Number of Children: N/A  . Years of Education: N/A   Occupational History  . Not on file.   Social History Main Topics  . Smoking status: Never Smoker   .  Smokeless tobacco: Never Used  . Alcohol Use: No  . Drug Use: No  . Sexual Activity: Yes   Other Topics Concern  . Not on file   Social History Narrative  . No narrative on file    Review of Systems: Constitutional: Negative for fever, chills, diaphoresis, activity change, appetite change and fatigue. HENT: Negative for ear pain, nosebleeds, congestion, facial swelling, rhinorrhea, neck pain, neck stiffness and ear discharge.  Eyes: Negative for pain, discharge, redness, itching and visual disturbance. Respiratory: Negative for cough, choking, chest tightness, shortness of breath, wheezing and stridor.  Cardiovascular: Negative for chest pain, palpitations and leg swelling. Gastrointestinal: Negative for abdominal distention. Genitourinary: Negative for dysuria, urgency, frequency, hematuria, flank pain, decreased urine volume, difficulty urinating and dyspareunia.  Musculoskeletal: Negative for back pain, joint swelling, arthralgia and gait problem. Neurological: Negative for dizziness, tremors, seizures, syncope, facial asymmetry, speech difficulty, weakness, light-headedness, numbness and headaches.  Hematological: Negative for adenopathy. Does not bruise/bleed easily. Psychiatric/Behavioral: Negative for hallucinations, behavioral problems, confusion, dysphoric mood, decreased concentration and agitation.    Objective:   Filed Vitals:   08/16/14 1639  BP: 114/74  Pulse: 79  Temp: 98 F (36.7 C)  Resp: 16    Physical Exam: Constitutional: Patient appears well-developed and well-nourished. No distress.  HENT: Normocephalic, atraumatic, External right and left ear normal. Oropharynx is clear and moist.  Eyes: Conjunctivae and EOM are normal. PERRLA, no scleral icterus. Neck: Normal ROM. Neck supple. No JVD. No tracheal deviation. No thyromegaly. CVS: RRR, S1/S2 +, no murmurs, no gallops, no carotid bruit.  Pulmonary: Effort and breath sounds normal, no stridor, rhonchi,  wheezes, rales.  Abdominal: Soft. BS +, no distension, tenderness, rebound or guarding.  Musculoskeletal: Normal range of motion. No edema and no tenderness.  Lymphadenopathy: No lymphadenopathy noted, cervical Neuro: Alert. Normal reflexes, muscle tone coordination. No cranial nerve deficit. Skin: Skin is warm and dry. No rash noted. Not diaphoretic. No erythema. No pallor. Psychiatric: Normal mood and affect. Behavior, judgment, thought content normal.  Lab Results  Component Value Date   WBC 9.8 08/12/2014   HGB 13.2 08/12/2014   HCT 37.9 08/12/2014   MCV 86.1 08/12/2014   PLT 265 08/12/2014   Lab Results  Component Value Date   CREATININE 0.45* 08/14/2014   BUN 9 08/14/2014   NA 142 08/14/2014   K 3.4* 08/14/2014   CL 105 08/14/2014   CO2 19 08/14/2014    Lab Results  Component Value Date   HGBA1C 11.6* 08/10/2014   Lipid Panel  No results found for this basename: chol, trig, hdl, cholhdl, vldl, ldlcalc       Assessment and plan:   [name] was seen today for establish care and hospitalization follow-up.  Diagnoses and associated orders for this visit:  Type II or unspecified type diabetes mellitus without mention of complication, uncontrolled - metFORMIN (GLUCOPHAGE) 1000 MG tablet; Take 1 tablet (1,000 mg total) by mouth 2 (two) times daily with a meal. - insulin NPH-regular Human (NOVOLIN 70/30) (70-30) 100 UNIT/ML injection; Inject 10 Units into the skin 2 (two) times daily with a meal. Changed dose to 10 units BID and will have patient RTC in 2 weeks for a nurse visit Spent more than 50% of time counseling patient on how to care for herself as a diabetic and how to properly eat, foot care, and complications of diabetes.   Hydronephrosis, unspecified hydronephrosis type - Ambulatory referral to Urology  Hyperglycemia - Glucose (CBG)    Return in about 2 weeks (around 08/30/2014) for Nurse Visit-CBG check and 3 mo PCP.  The patient was given clear instructions to go to  ER or return to medical center if symptoms [name]'t improve, worsen or new problems develop. The patient verbalized understanding.   Chari Manning, NP-C [facility] [phone] 08/21/2014, 2:51 PM

## 2014-08-25 ENCOUNTER — Other Ambulatory Visit: Payer: Self-pay | Admitting: Internal Medicine

## 2014-08-25 DIAGNOSIS — E1165 Type 2 diabetes mellitus with hyperglycemia: Principal | ICD-10-CM

## 2014-08-25 DIAGNOSIS — IMO0001 Reserved for inherently not codable concepts without codable children: Secondary | ICD-10-CM

## 2014-08-25 MED ORDER — INSULIN NPH ISOPHANE & REGULAR (70-30) 100 UNIT/ML ~~LOC~~ SUSP: 15.0000 [IU] | Freq: Two times a day (BID) | SUBCUTANEOUS | Status: DC

## 2014-08-30 ENCOUNTER — Ambulatory Visit: Payer: Self-pay | Attending: Internal Medicine

## 2014-08-30 DIAGNOSIS — E119 Type 2 diabetes mellitus without complications: Secondary | ICD-10-CM | POA: Insufficient documentation

## 2014-08-30 LAB — GLUCOSE, POCT (MANUAL RESULT ENTRY): POC Glucose: 145 mg/dl — AB (ref 70–99)

## 2014-08-30 NOTE — Progress Notes (Signed)
Pt is here today to check her CBG today cbg was 145. Pt said that she has ate this morning. She brought in a cbg log and most of her readings are wnl.

## 2014-08-30 NOTE — Addendum Note (Signed)
Addended by: Luberta Mutter R on: 08/30/2014 10:08 AM   Modules accepted: Orders

## 2014-08-30 NOTE — Patient Instructions (Signed)
Continue to take your medications as prescribe.

## 2014-09-08 ENCOUNTER — Telehealth: Payer: Self-pay | Admitting: Internal Medicine

## 2014-09-08 ENCOUNTER — Telehealth: Payer: Self-pay | Admitting: Emergency Medicine

## 2014-09-08 NOTE — Telephone Encounter (Signed)
Pt. Called stating that she has been having a constant headache for more than a week and over the counter medication does not help. Please f/u with pt.

## 2014-09-12 ENCOUNTER — Ambulatory Visit (INDEPENDENT_AMBULATORY_CARE_PROVIDER_SITE_OTHER): Payer: Self-pay | Admitting: Urology

## 2014-09-12 DIAGNOSIS — R82998 Other abnormal findings in urine: Secondary | ICD-10-CM

## 2014-09-12 DIAGNOSIS — N1330 Unspecified hydronephrosis: Secondary | ICD-10-CM

## 2014-09-13 ENCOUNTER — Ambulatory Visit: Payer: Self-pay | Attending: Internal Medicine | Admitting: Internal Medicine

## 2014-09-13 ENCOUNTER — Encounter: Payer: Self-pay | Admitting: Internal Medicine

## 2014-09-13 VITALS — BP 115/78 | HR 65 | Temp 98.7°F | Resp 16 | Ht 61.0 in | Wt 132.0 lb

## 2014-09-13 DIAGNOSIS — R51 Headache: Secondary | ICD-10-CM | POA: Insufficient documentation

## 2014-09-13 DIAGNOSIS — Z2821 Immunization not carried out because of patient refusal: Secondary | ICD-10-CM

## 2014-09-13 DIAGNOSIS — Z794 Long term (current) use of insulin: Secondary | ICD-10-CM | POA: Insufficient documentation

## 2014-09-13 DIAGNOSIS — R519 Headache, unspecified: Secondary | ICD-10-CM

## 2014-09-13 DIAGNOSIS — E119 Type 2 diabetes mellitus without complications: Secondary | ICD-10-CM | POA: Insufficient documentation

## 2014-09-13 LAB — GLUCOSE, POCT (MANUAL RESULT ENTRY): POC GLUCOSE: 100 mg/dL — AB (ref 70–99)

## 2014-09-13 MED ORDER — CYCLOBENZAPRINE HCL 10 MG PO TABS: 10.0000 mg | ORAL_TABLET | Freq: Three times a day (TID) | ORAL | Status: DC | PRN

## 2014-09-13 NOTE — Progress Notes (Signed)
Patient ID: [name], female   DOB: [birth year], 33 y.o.   MRN: [id number]  CC: headaches  HPI:  Patient  SUBJECTIVE: [name] is a 33 y.o. female who complains of headaches for 15  day(s). Description of pain: throbbing pain, bilateral in the occipital area. Duration of individual headaches: multiple days in a row, frequency daily, continuously. Associated symptoms: flashing lights, light sensitivity. Pain relief: ibuprofen, cold packs, lying in a darkened room and sleep. Precipitating factors: patient is aware of none. She denies a history of recent head injury.  Prior neurological history: negative for no neurological problems. Neurologic Review of Systems - no TIA or stroke-like symptoms.  Patient reports that she has not had to use 70/30 in 2 weeks. Her blood sugar reveals fasting blood sugars of 68-110.  She reports that she has only been using Metformin because she is afraid the insulin will drop her blood sugar too much.  She reports that she has been exercising for 1.5 hours per day and has dramatically changed her diet.   Has a history of frequent headaches since age 25.  Has not been diagnosed with migraines specifically, but has been on two different medications in the past but she cannot remember the name.     OBJECTIVE: Appearance: alert, well appearing, and in no distress. Neurological Exam: alert, oriented, normal speech, no focal findings or movement disorder noted.   No Known Allergies Past Medical History  Diagnosis Date  . Headache     "monthly" (08/10/2014)  . Diabetes mellitus without complication    Current Outpatient Prescriptions on File Prior to Visit  Medication Sig Dispense Refill  . glucose blood test strip Use as instructed  100 each  0  . glucose monitoring kit (FREESTYLE) monitoring kit 1 each by Does not apply route as needed for other.  1 each  0  . insulin NPH-regular Human (NOVOLIN 70/30) (70-30) 100 UNIT/ML injection Inject 15 Units  into the skin 2 (two) times daily with a meal.  3 vial  3  . Insulin Syringe-Needle U-100 30G X 1/2" 0.5 ML MISC Use as directed  10 each  0  . medroxyPROGESTERone (DEPO-PROVERA) 150 MG/ML injection Inject 150 mg into the muscle every 3 (three) months.      . metFORMIN (GLUCOPHAGE) 1000 MG tablet Take 1 tablet (1,000 mg total) by mouth 2 (two) times daily with a meal.  180 tablet  3  . cephALEXin (KEFLEX) 500 MG capsule Take 1 capsule (500 mg total) by mouth 4 (four) times daily.  40 capsule  0   No current facility-administered medications on file prior to visit.   History reviewed. No pertinent family history. History   Social History  . Marital Status: Married    Spouse Name: N/A    Number of Children: N/A  . Years of Education: N/A   Occupational History  . Not on file.   Social History Main Topics  . Smoking status: Never Smoker   . Smokeless tobacco: Never Used  . Alcohol Use: No  . Drug Use: No  . Sexual Activity: Yes   Other Topics Concern  . Not on file   Social History Narrative  . No narrative on file    Review of Systems: Review of Systems  Eyes: Positive for blurred vision.  Respiratory: Negative.   Cardiovascular: Negative.   Gastrointestinal: Negative for nausea, vomiting and abdominal pain.  Musculoskeletal: Negative for back pain and neck pain.  Neurological: Positive for headaches.  Negative for dizziness and tingling.      Objective:   Filed Vitals:   09/13/14 0919  BP: 115/78  Pulse: 65  Temp: 98.7 F (37.1 C)  Resp: 16    Physical Exam  Eyes: Conjunctivae are normal. Pupils are equal, round, and reactive to light.  Neck: Normal range of motion.  Cardiovascular: Normal rate, regular rhythm and normal heart sounds.   Pulmonary/Chest: Effort normal and breath sounds normal.  Musculoskeletal: Normal range of motion. She exhibits no tenderness.  Neurological: She is alert. She has normal reflexes. No cranial nerve deficit.     Lab  Results  Component Value Date   WBC 9.8 08/12/2014   HGB 13.2 08/12/2014   HCT 37.9 08/12/2014   MCV 86.1 08/12/2014   PLT 265 08/12/2014   Lab Results  Component Value Date   CREATININE 0.45* 08/14/2014   BUN 9 08/14/2014   NA 142 08/14/2014   K 3.4* 08/14/2014   CL 105 08/14/2014   CO2 19 08/14/2014    Lab Results  Component Value Date   HGBA1C 11.6* 08/10/2014   Lipid Panel  No results found for this basename: chol, trig, hdl, cholhdl, vldl, ldlcalc       Assessment and plan:   [name] was seen today for follow-up.  Diagnoses and associated orders for this visit:  Type 2 diabetes mellitus without complication - Glucose (CBG)  Frequent headaches - cyclobenzaprine (FLEXERIL) 10 MG tablet; Take 1 tablet (10 mg total) by mouth 3 (three) times daily as needed. Have patient to make headache diary with onset, duration, activity, possible triggers.   May be caused by rapid changes in blood sugars.  A1C of 11.6 and now have normal readings PLAN: Recommendations: lie in darkened room and apply cold packs prn for pain, side effect profile discussed in detail, asked to keep headache diary.  Return in about 3 weeks (around 10/04/2014) for Nurse Visit-Headache frequency.  Nurse can briefly follow up with patient on improvement of headaches.  If no improvement, may change patients medications.        Chari Manning, [place] [facility] [phone] 09/13/2014, 9:30 AM

## 2014-09-13 NOTE — Progress Notes (Signed)
Pt is here today states that she has had a migraine for about 15 days.

## 2014-09-14 ENCOUNTER — Telehealth: Payer: Self-pay | Admitting: Emergency Medicine

## 2014-09-14 NOTE — Telephone Encounter (Signed)
Spoke to pt in regards to headache x 1 week without relief. Pt states she was prescribed medication yesterday by provider per Spanish intepretor

## 2014-09-21 ENCOUNTER — Encounter: Payer: Self-pay | Attending: Internal Medicine | Admitting: Dietician

## 2014-09-21 ENCOUNTER — Encounter: Payer: Self-pay | Admitting: Dietician

## 2014-09-21 VITALS — Ht 61.0 in | Wt 140.0 lb

## 2014-09-21 DIAGNOSIS — E119 Type 2 diabetes mellitus without complications: Secondary | ICD-10-CM | POA: Insufficient documentation

## 2014-09-21 DIAGNOSIS — Z713 Dietary counseling and surveillance: Secondary | ICD-10-CM | POA: Insufficient documentation

## 2014-09-21 DIAGNOSIS — Z794 Long term (current) use of insulin: Secondary | ICD-10-CM | POA: Insufficient documentation

## 2014-09-21 NOTE — Progress Notes (Signed)
  Medical Nutrition Therapy:  Appt start time: 1030 end time:  1130.   Assessment:  Primary concerns today: [name] was referred for diabetes management and is here today with a Optometrist. [name] was diagnosed with diabetes in August 2015 with a HgA1c of 11.6%.  She checks her BG twice a day in the morning before breakfast and at night before she eats dinner.  She reports her readings are typically 65-118.  She has made big changes to her diet, eating more vegetables, eats only [place] and chicken for meats, and has stopped eating rice and pasta.  She only drinks water and green tea, no sugary beverages.  She denies eating sweets and is not much of a snacker.  She began her exercise regimen 2 weeks ago.  She reports she feels confident in keeping up these changes.    Learning Readiness:  Change in progress  MEDICATIONS: see list   DIETARY INTAKE:  Usual eating pattern includes 3 meals and 0-1 snacks per day.  24-hr recall:  B ( AM): [place] sandwich on wheat bread with lettuce and tomato and hot green tea with cinnamon  Snk ( AM): none  L ( PM): vegetables, [place] or chicken and sometimes a wheat flour tortillia and hot green tea  Snk ( PM): sometimes fruit D ( PM): vegetables, [place] or chicken Snk ( PM): none Beverages: water, hot green tea  Usual physical activity: For the past two weeks she has walked every day 15 min after breakfast and 20-30 min after lunch and dinner  Progress Towards Goal(s):  In progress.   Nutritional Diagnosis:  NB-1.1 Food and nutrition-related knowledge deficit As related to no prior formal education for blood sugar management.  As evidenced by new diagnosis of diabetes and HgA1c of 11.6%.    Intervention:  Nutrition education for managing blood glucose with diet and lifestyle changes. Described diabetes. Stated some common risk factors for diabetes.  Defined the role of glucose and insulin.  Identified type of diabetes and pathophysiology.  Described the  relationship between diabetes and cardiovascular risk.  Described the HgA1c test and stated that her goal is to reach 7% or less.  Described the role of different macronutrients on glucose.  Explained how carbohydrates affect blood glucose.  Stated what foods contain the most carbohydrates.  Demonstrated carbohydrate counting.  Demonstrated how to read Nutrition Facts food label.  Discussed health benefits of fiber.  Outlined use of the MyPlate method for portion control. Discussed hypoglycemia and how to treat with the Rule of 15.  Recommended she carry her glucometer with her everywhere she goes.    Goals:  Follow Diabetes Meal Plan as instructed  Eat 3 meals and snacks as needed, every 3-5 hrs  Limit carbohydrate intake to 30-45 grams carbohydrate/meal  Limit carbohydrate intake to 15-30 grams carbohydrate/snack  Add lean protein foods to meals/snacks  Monitor glucose levels as instructed by your doctor  Aim for at least 30 mins of physical activity daily  Teaching Method Utilized: Visual Auditory  Handouts given during visit include:  Living Well With Diabetes Spanish  [employer] Spanish  MyPlate Spanish  Barriers to learning/adherence to lifestyle change: none  Demonstrated degree of understanding via:  Teach Back   Monitoring/Evaluation:  Dietary intake, exercise, labs, and body weight prn. Patient did not want follow-up at this time.  Business card given.

## 2014-09-29 ENCOUNTER — Other Ambulatory Visit: Payer: Self-pay | Admitting: Urology

## 2014-09-29 ENCOUNTER — Other Ambulatory Visit: Payer: Self-pay | Admitting: *Deleted

## 2014-09-29 ENCOUNTER — Telehealth: Payer: Self-pay | Admitting: Internal Medicine

## 2014-09-29 DIAGNOSIS — R8281 Pyuria: Secondary | ICD-10-CM

## 2014-09-29 MED ORDER — FREESTYLE LANCETS MISC: Status: DC

## 2014-09-29 MED ORDER — GLUCOSE BLOOD VI STRP: ORAL_STRIP | Status: DC

## 2014-09-29 NOTE — Telephone Encounter (Signed)
Patient needs to get refill for glucose blood test strip and lancets. Pleas follow up with Patient. Patient ONLY speaks Spanish.

## 2014-09-29 NOTE — Telephone Encounter (Signed)
Lancets and test strip was e-screen to our pharmacy

## 2014-10-03 ENCOUNTER — Other Ambulatory Visit (HOSPITAL_COMMUNITY): Payer: Self-pay

## 2014-10-04 ENCOUNTER — Ambulatory Visit: Payer: Self-pay | Attending: Internal Medicine

## 2014-10-04 VITALS — BP 126/82 | HR 76 | Temp 98.0°F | Resp 16

## 2014-10-04 DIAGNOSIS — G44221 Chronic tension-type headache, intractable: Secondary | ICD-10-CM

## 2014-10-04 NOTE — Progress Notes (Unsigned)
Patient here for follow up on her migraine headaches States medication helps a little but still having headaches We put referral in Epic for neurology consult and refill her migraine medication Until her appointment with specialist

## 2014-10-05 ENCOUNTER — Ambulatory Visit (HOSPITAL_COMMUNITY)
Admission: RE | Admit: 2014-10-05 | Discharge: 2014-10-05 | Disposition: A | Discharge status: Home / Self Care | Payer: MEDICAID | Source: Ambulatory Visit | Attending: Urology | Admitting: Urology

## 2014-10-05 DIAGNOSIS — N390 Urinary tract infection, site not specified: Secondary | ICD-10-CM | POA: Insufficient documentation

## 2014-10-05 DIAGNOSIS — R8281 Pyuria: Secondary | ICD-10-CM

## 2014-11-17 ENCOUNTER — Encounter: Payer: Self-pay | Admitting: Internal Medicine

## 2014-11-17 ENCOUNTER — Ambulatory Visit: Payer: Medicaid Other | Attending: Internal Medicine | Admitting: Internal Medicine

## 2014-11-17 VITALS — BP 114/75 | HR 72 | Temp 98.5°F | Resp 16 | Ht 61.0 in | Wt 147.0 lb

## 2014-11-17 DIAGNOSIS — Z793 Long term (current) use of hormonal contraceptives: Secondary | ICD-10-CM | POA: Insufficient documentation

## 2014-11-17 DIAGNOSIS — Z794 Long term (current) use of insulin: Secondary | ICD-10-CM | POA: Insufficient documentation

## 2014-11-17 DIAGNOSIS — R51 Headache: Secondary | ICD-10-CM | POA: Insufficient documentation

## 2014-11-17 DIAGNOSIS — E119 Type 2 diabetes mellitus without complications: Secondary | ICD-10-CM | POA: Insufficient documentation

## 2014-11-17 DIAGNOSIS — Z792 Long term (current) use of antibiotics: Secondary | ICD-10-CM | POA: Insufficient documentation

## 2014-11-17 DIAGNOSIS — G629 Polyneuropathy, unspecified: Secondary | ICD-10-CM | POA: Insufficient documentation

## 2014-11-17 LAB — POCT GLYCOSYLATED HEMOGLOBIN (HGB A1C): Hemoglobin A1C: 6.2

## 2014-11-17 LAB — GLUCOSE, POCT (MANUAL RESULT ENTRY): POC Glucose: 85 mg/dl (ref 70–99)

## 2014-11-17 MED ORDER — METFORMIN HCL 1000 MG PO TABS
1000.0000 mg | ORAL_TABLET | Freq: Two times a day (BID) | ORAL | Status: DC
Start: 2014-11-17 — End: 2015-08-02

## 2014-11-17 MED ORDER — GABAPENTIN 100 MG PO CAPS: 100.0000 mg | ORAL_CAPSULE | Freq: Three times a day (TID) | ORAL | Status: DC

## 2014-11-17 NOTE — Progress Notes (Signed)
Pt is here today following up on her diabetes. Pt reports that she has occasional pain in her hands.

## 2014-11-17 NOTE — Progress Notes (Signed)
Patient ID: [name], female   DOB: Dec 09, 1981, 33 y.o.   MRN: [id number]  CC:  DM follow up  HPI:  Patient reports that she has continued to take her medications daily and changed her diet.  She has been having pain and swelling in her hands.  The pain in her hands is described as a tingling/burning sensations that is more severe at night.  She has tried Naproxen for the pain. Patient reports that she has a scheduled appointment with Neurology in two weeks but believes she will not be able to attend due to the expensive copay.  She notes that she has not had a headache in one month.      No Known Allergies Past Medical History  Diagnosis Date  . Headache     "monthly" (08/10/2014)  . Diabetes mellitus without complication    Current Outpatient Prescriptions on File Prior to Visit  Medication Sig Dispense Refill  . glucose blood test strip Use as instructed 100 each 0  . glucose monitoring kit (FREESTYLE) monitoring kit 1 each by Does not apply route as needed for other. 1 each 0  . insulin NPH-regular Human (NOVOLIN 70/30) (70-30) 100 UNIT/ML injection Inject 15 Units into the skin 2 (two) times daily with a meal. 3 vial 3  . Insulin Syringe-Needle U-100 30G X 1/2" 0.5 ML MISC Use as directed 10 each 0  . Lancets (FREESTYLE) lancets Use as instructed 100 each 12  . metFORMIN (GLUCOPHAGE) 1000 MG tablet Take 1 tablet (1,000 mg total) by mouth 2 (two) times daily with a meal. 180 tablet 3  . cephALEXin (KEFLEX) 500 MG capsule Take 1 capsule (500 mg total) by mouth 4 (four) times daily. (Patient not taking: Reported on 11/17/2014) 40 capsule 0  . cyclobenzaprine (FLEXERIL) 10 MG tablet Take 1 tablet (10 mg total) by mouth 3 (three) times daily as needed. (Patient not taking: Reported on 11/17/2014) 60 tablet 0  . medroxyPROGESTERone (DEPO-PROVERA) 150 MG/ML injection Inject 150 mg into the muscle every 3 (three) months.    . SULFAMETHOXAZOLE-TRIMETHOPRIM PO Take by mouth 2 (two) times  daily.    . SUMAtriptan (IMITREX) 25 MG tablet Take 25 mg by mouth every 2 (two) hours as needed for migraine or headache. May repeat in 2 hours if headache persists or recurs.     No current facility-administered medications on file prior to visit.   History reviewed. No pertinent family history. History   Social History  . Marital Status: Married    Spouse Name: N/A    Number of Children: N/A  . Years of Education: N/A   Occupational History  . Not on file.   Social History Main Topics  . Smoking status: Never Smoker   . Smokeless tobacco: Never Used  . Alcohol Use: No  . Drug Use: No  . Sexual Activity: Yes   Other Topics Concern  . Not on file   Social History Narrative    Review of Systems  Neurological: Positive for tingling.  All other systems reviewed and are negative.     Objective:   Filed Vitals:   11/17/14 1225  BP: 114/75  Pulse: 72  Temp: 98.5 F (36.9 C)  Resp: 16    Physical Exam  Constitutional: She is oriented to person, place, and time.  Cardiovascular: Normal rate, regular rhythm and normal heart sounds.   Pulmonary/Chest: Effort normal and breath sounds normal.  Abdominal: Soft. Bowel sounds are normal.  Musculoskeletal: She exhibits no  edema or tenderness.  Neurological: She is alert and oriented to person, place, and time.  Skin: Skin is warm and dry.     Lab Results  Component Value Date   WBC 9.8 08/12/2014   HGB 13.2 08/12/2014   HCT 37.9 08/12/2014   MCV 86.1 08/12/2014   PLT 265 08/12/2014   Lab Results  Component Value Date   CREATININE 0.45* 08/14/2014   BUN 9 08/14/2014   NA 142 08/14/2014   K 3.4* 08/14/2014   CL 105 08/14/2014   CO2 19 08/14/2014    Lab Results  Component Value Date   HGBA1C 6.2 11/17/2014   Lipid Panel  No results found for: CHOL, TRIG, HDL, CHOLHDL, VLDL, LDLCALC     Assessment and plan:   Ashia was seen today for follow-up.  Diagnoses and associated orders for this  visit:  Type 2 diabetes mellitus without complication - Glucose (CBG) - HgB A1c - metFORMIN (GLUCOPHAGE) 1000 MG tablet; Take 1 tablet (1,000 mg total) by mouth 2 (two) times daily with a meal. - Lipid panel; Future - Microalbumin, urine; Future  Peripheral neuropathy - gabapentin (NEURONTIN) 100 MG capsule; Take 1 capsule (100 mg total) by mouth 3 (three) times daily.---will also help with headache prophylaxis.   Return in about 3 months (around 02/16/2015) for Diabetes Mellitus.  Due to language barrier, an interpreter was present during the history-taking and subsequent discussion (and for part of the physical exam) with this patient.       Chari Manning, NP-C [facility] [phone] 11/17/2014, 12:47 PM

## 2014-11-17 NOTE — Patient Instructions (Signed)

## 2014-11-24 ENCOUNTER — Ambulatory Visit: Payer: Medicaid Other | Attending: Internal Medicine

## 2014-11-24 DIAGNOSIS — E119 Type 2 diabetes mellitus without complications: Secondary | ICD-10-CM

## 2014-11-24 LAB — LIPID PANEL
CHOLESTEROL: 185 mg/dL (ref 0–200)
HDL: 51 mg/dL (ref >39)
LDL CALC: 107 mg/dL — AB (ref 0–99)
TRIGLYCERIDES: 135 mg/dL (ref <150)
Total CHOL/HDL Ratio: 3.6 Ratio
VLDL: 27 mg/dL (ref 0–40)

## 2014-11-25 LAB — MICROALBUMIN, URINE: MICROALB UR: 1 mg/dL (ref <2.0)

## 2014-12-04 ENCOUNTER — Ambulatory Visit: Payer: Self-pay | Admitting: Neurology

## 2014-12-05 ENCOUNTER — Telehealth: Payer: Self-pay | Admitting: *Deleted

## 2014-12-05 ENCOUNTER — Telehealth: Payer: Self-pay | Admitting: Neurology

## 2014-12-05 NOTE — Telephone Encounter (Signed)
Pt cancelled 12/04/14 appt w/ Dr. Tomi Likens. Referring provider's office notified via EPIC referral / Sherri S.

## 2014-12-05 NOTE — Telephone Encounter (Signed)
Pt aware of lab results, advice to maintain a low fat diet and exercise    Notes Recorded by Lance Bosch, NP on 11/26/2014 at 10:19 PM LDL slightly elevated. Please provide appropriate education regarding diet and exercise. Explain the importance of appropriate levels relating to diabetes and heart disease

## 2015-05-16 ENCOUNTER — Ambulatory Visit: Payer: Medicaid Other | Attending: Internal Medicine

## 2015-08-02 ENCOUNTER — Encounter: Payer: Self-pay | Admitting: Internal Medicine

## 2015-08-02 ENCOUNTER — Ambulatory Visit: Payer: Medicaid Other | Attending: Internal Medicine | Admitting: Internal Medicine

## 2015-08-02 VITALS — BP 119/76 | HR 76 | Temp 98.3°F | Resp 16 | Ht 61.0 in | Wt 151.0 lb

## 2015-08-02 DIAGNOSIS — E119 Type 2 diabetes mellitus without complications: Secondary | ICD-10-CM | POA: Diagnosis not present

## 2015-08-02 DIAGNOSIS — Z331 Pregnant state, incidental: Secondary | ICD-10-CM | POA: Diagnosis not present

## 2015-08-02 DIAGNOSIS — Z3490 Encounter for supervision of normal pregnancy, unspecified, unspecified trimester: Secondary | ICD-10-CM

## 2015-08-02 DIAGNOSIS — Z794 Long term (current) use of insulin: Secondary | ICD-10-CM | POA: Insufficient documentation

## 2015-08-02 DIAGNOSIS — Z79899 Other long term (current) drug therapy: Secondary | ICD-10-CM | POA: Insufficient documentation

## 2015-08-02 LAB — POCT URINE PREGNANCY: PREG TEST UR: POSITIVE — AB

## 2015-08-02 LAB — GLUCOSE, POCT (MANUAL RESULT ENTRY): POC Glucose: 122 mg/dl — AB (ref 70–99)

## 2015-08-02 LAB — POCT GLYCOSYLATED HEMOGLOBIN (HGB A1C): HEMOGLOBIN A1C: 7.6

## 2015-08-02 MED ORDER — GLUCOSE BLOOD VI STRP: ORAL_STRIP | Status: DC

## 2015-08-02 MED ORDER — METFORMIN HCL 1000 MG PO TABS: 1000.0000 mg | ORAL_TABLET | Freq: Two times a day (BID) | ORAL | Status: DC

## 2015-08-02 NOTE — Progress Notes (Signed)
F/U DM  Stated not checking glucose as directed. Not taking medication  Last medication was taken 07/29/2015 Last menses June 20, 2015 requesting UPT

## 2015-08-02 NOTE — Patient Instructions (Addendum)
You are estimated to be around [redacted] weeks pregnant today.   OB/GYN will call with a appointment and they will manage your diabetes while pregnant

## 2015-08-02 NOTE — Progress Notes (Signed)
Patient ID: [name], female   DOB: 09-01-1981, 34 y.o.   MRN: [id number] SUBJECTIVE: 34 y.o. female for follow up of diabetes. Diabetic Review of Systems - medication compliance: compliant most of the time, diabetic diet compliance: compliant most of the time, home glucose monitoring: is performed sporadically.  Other symptoms and concerns: Stated not checking glucose as directed. Not taking medication because she thought she may be pregnant and was unsure if medication was safe. Last medication was taken 07/29/2015. Last menses June 20, 2015 requesting UPT.    Current Outpatient Prescriptions  Medication Sig Dispense Refill  . glucose blood test strip Use as instructed (Patient not taking: Reported on 08/02/2015) 100 each 0  . glucose monitoring kit (FREESTYLE) monitoring kit 1 each by Does not apply route as needed for other. (Patient not taking: Reported on 08/02/2015) 1 each 0  . insulin NPH-regular Human (NOVOLIN 70/30) (70-30) 100 UNIT/ML injection Inject 15 Units into the skin 2 (two) times daily with a meal. (Patient not taking: Reported on 08/02/2015) 3 vial 3  . Insulin Syringe-Needle U-100 30G X 1/2" 0.5 ML MISC Use as directed (Patient not taking: Reported on 08/02/2015) 10 each 0  . Lancets (FREESTYLE) lancets Use as instructed (Patient not taking: Reported on 08/02/2015) 100 each 12  . metFORMIN (GLUCOPHAGE) 1000 MG tablet Take 1 tablet (1,000 mg total) by mouth 2 (two) times daily with a meal. (Patient not taking: Reported on 08/02/2015) 60 tablet 3   No current facility-administered medications for this visit.    OBJECTIVE: Appearance: alert, well appearing, and in no distress, oriented to person, place, and time and normal appearing weight. BP 119/76 mmHg  Pulse 76  Temp(Src) 98.3 F (36.8 C) (Oral)  Resp 16  Ht 5' 1"  (1.549 m)  Wt 151 lb (68.493 kg)  BMI 28.55 kg/m2  SpO2 100%  LMP 06/20/2015  Exam: heart sounds normal rate, regular rhythm, normal S1, S2, no murmurs,  rubs, clicks or gallops, no JVD, chest clear, no carotid bruits, feet: warm, good capillary refill and no trophic changes or ulcerative lesions  ASSESSMENT: Diabetes Mellitus: stable  Pregnant: She will continue taking prenatal vitamin. I have advised patient that both insulin and Metformin are safe in pregnancy. I have advised her to seek OB/GYN asap so they can go over medication management with her.   PLAN: See orders for this visit as documented in the electronic medical record. Issues reviewed with her: diabetic diet discussed in detail, written exchange diet given, home glucose monitoring emphasized, annual eye examinations at Ophthalmology discussed and long term diabetic complications discussed.   Due to language barrier, an interpreter was present during the history-taking and subsequent discussion (and for part of the physical exam) with this patient--[id number]--Spanish interpreter   Return if symptoms worsen or fail to improve.  Lance Bosch, NP 08/09/2015 6:47 PM

## 2015-08-03 ENCOUNTER — Encounter (HOSPITAL_COMMUNITY): Payer: Self-pay

## 2015-08-03 ENCOUNTER — Inpatient Hospital Stay (HOSPITAL_COMMUNITY)
Admission: AD | Admit: 2015-08-03 | Discharge: 2015-08-03 | Disposition: A | Discharge status: Home / Self Care | Payer: Self-pay | Source: Ambulatory Visit | Attending: Family Medicine | Admitting: Family Medicine

## 2015-08-03 ENCOUNTER — Inpatient Hospital Stay (HOSPITAL_COMMUNITY): Payer: Medicaid Other

## 2015-08-03 ENCOUNTER — Other Ambulatory Visit: Payer: Self-pay | Admitting: *Deleted

## 2015-08-03 DIAGNOSIS — O24111 Pre-existing diabetes mellitus, type 2, in pregnancy, first trimester: Secondary | ICD-10-CM | POA: Insufficient documentation

## 2015-08-03 DIAGNOSIS — E1142 Type 2 diabetes mellitus with diabetic polyneuropathy: Secondary | ICD-10-CM | POA: Insufficient documentation

## 2015-08-03 DIAGNOSIS — O209 Hemorrhage in early pregnancy, unspecified: Secondary | ICD-10-CM

## 2015-08-03 DIAGNOSIS — Z794 Long term (current) use of insulin: Secondary | ICD-10-CM | POA: Insufficient documentation

## 2015-08-03 DIAGNOSIS — O3680X1 Pregnancy with inconclusive fetal viability, fetus 1: Secondary | ICD-10-CM

## 2015-08-03 DIAGNOSIS — Z3A01 Less than 8 weeks gestation of pregnancy: Secondary | ICD-10-CM | POA: Insufficient documentation

## 2015-08-03 LAB — CBC
HCT: 36.7 % (ref 36.0–46.0)
Hemoglobin: 12.2 g/dL (ref 12.0–15.0)
MCH: 28.4 pg (ref 26.0–34.0)
MCHC: 33.2 g/dL (ref 30.0–36.0)
MCV: 85.5 fL (ref 78.0–100.0)
Platelets: 233 K/uL (ref 150–400)
RBC: 4.29 MIL/uL (ref 3.87–5.11)
RDW: 14.2 % (ref 11.5–15.5)
WBC: 8.1 K/uL (ref 4.0–10.5)

## 2015-08-03 LAB — URINALYSIS, ROUTINE W REFLEX MICROSCOPIC
BILIRUBIN URINE: NEGATIVE
GLUCOSE, UA: NEGATIVE mg/dL
KETONES UR: NEGATIVE mg/dL
Nitrite: NEGATIVE
PROTEIN: NEGATIVE mg/dL
Specific Gravity, Urine: 1.01 (ref 1.005–1.030)
UROBILINOGEN UA: 0.2 mg/dL (ref 0.0–1.0)
pH: 7 (ref 5.0–8.0)

## 2015-08-03 LAB — URINE MICROSCOPIC-ADD ON

## 2015-08-03 LAB — WET PREP, GENITAL
CLUE CELLS WET PREP: NONE SEEN
TRICH WET PREP: NONE SEEN
Yeast Wet Prep HPF POC: NONE SEEN

## 2015-08-03 LAB — ABO/RH: ABO/RH(D): O POS

## 2015-08-03 LAB — HCG, QUANTITATIVE, PREGNANCY: hCG, Beta Chain, Quant, S: 8211 m[IU]/mL — ABNORMAL HIGH

## 2015-08-03 NOTE — Discharge Instructions (Signed)
Hemorragia vaginal durante el embarazo (primer trimestre) (Vaginal Bleeding During Pregnancy, First Trimester) Durante los primeros meses del embarazo es relativamente frecuente que se presente una pequea hemorragia (manchas). Esta situacin generalmente mejora por s misma. Estas hemorragias o manchas tienen diversas causas al inicio del embarazo. Algunas manchas pueden estar relacionadas al [employer] y otras no. En la [employer], la hemorragia es normal y no es un problema. Sin embargo, la hemorragia tambin puede ser un signo de algo grave. Debe informar a su mdico de inmediato si tiene alguna hemorragia vaginal. Algunas causas posibles de hemorragia vaginal durante el primer trimestre incluyen:  Infeccin o inflamacin del cuello del tero.  Crecimientos anormales (plipos) en el cuello del tero.  Aborto espontneo o amenaza de aborto espontneo.  Tejido del Media planner se ha desarrollado fuera del tero y en una trompa de falopio (embarazo ectpico).  Se han desarrollado pequeos quistes en el tero en lugar de tejido de embarazo (embarazo molar). INSTRUCCIONES PARA EL CUIDADO EN EL HOGAR  Controle su afeccin para ver si hay cambios. Las siguientes indicaciones ayudarn a [employer] pueda sentir:  Siga las indicaciones del mdico para restringir su actividad. Si el mdico le indica descanso en la cama, debe quedarse en la cama y levantarse solo para ir al bao. No obstante, el mdico puede permitirle que continu con tareas livianas.  Si es necesario, organcese para que alguien le ayude con las actividades y responsabilidades cotidianas mientras est en cama.  Lleve un registro de la cantidad y la saturacin de las toallas higinicas que Medical laboratory scientific officer. Anote este dato.  No use tampones.No se haga duchas vaginales.  No tenga relaciones sexuales u orgasmos hasta que el mdico la autorice.  Si elimina tejido por la vagina, gurdelo para mostrrselo al  [employer].  [place] solo medicamentos de venta libre o recetados, segn las indicaciones del mdico.  No tome aspirina, ya que puede causar hemorragias.  Cumpla con todas las visitas de control, segn le indique su mdico. SOLICITE ATENCIN MDICA SI:  Tiene un sangrado vaginal en cualquier momento del embarazo.  Tiene calambres o dolores de [place].  Tiene fiebre que los medicamentos no Engineer, petroleum. SOLICITE ATENCIN MDICA DE INMEDIATO SI:   Siente calambres intensos en la espalda o en el vientre (abdomen).  Elimina cogulos grandes o tejido por la vagina.  La hemorragia aumenta.  Si se siente mareada, dbil o se desmaya.  Tiene escalofros.  Tiene una prdida importante o sale lquido a borbotones por la vagina.  Se desmaya mientras defeca. ASEGRESE DE QUE:  Comprende estas instrucciones.  Controlar su afeccin.  Recibir ayuda de inmediato si no mejora o si empeora. Document Released: 09/10/2005 Document Revised: 12/06/2013 [facility] Patient Information 2015 [place]. This information is not intended to replace advice given to you by your health care provider. Make sure you discuss any questions you have with your health care provider.

## 2015-08-03 NOTE — MAU Note (Signed)
Was seen at primary care yesterday. Had + UPT, bleeding started this AM, when she wipes. "Just a little bit" Feels a little bit of pressure.

## 2015-08-03 NOTE — Progress Notes (Unsigned)
Ultrasound scheduled for 08/14/2015 @ 1000

## 2015-08-03 NOTE — MAU Provider Note (Signed)
History     CSN: [id number]  Arrival date and time: 08/03/15 1443   First Provider Initiated Contact with Patient 08/03/15 1007      Chief Complaint  Patient presents with  . Vaginal Bleeding   This is a 34 y.o. female at 75w2dwho presents with c/o bleeding which started today. She was seen at her primary care doctor's office yesterday and found to be pregnant. She was there for diabetes management. States bleeding started this morning, blood on tissue, not requiring a pad. Feels a little pressure but no pain.   History is remarkable for Type II diabetes with peripheral neuropathy. Hemoglobin A1C yesterday was 7.6.  States she had a 20 week loss, likely due to incompetent cervix vs PTL (states she started bleeding and the baby came).  Also had a preterm delivery at "8 months" but baby did not require NICU.  Other baby came at 324 weeks    Vaginal Bleeding The patient's primary symptoms include vaginal bleeding. The patient's pertinent negatives include no genital itching, genital lesions or genital odor. This is a new problem. The current episode started today. The problem occurs constantly. The problem has been unchanged. The patient is experiencing no pain. She is pregnant. Pertinent negatives include no abdominal pain, back pain, chills, constipation, diarrhea, fever, headaches, nausea or vomiting. The vaginal discharge was bloody. The vaginal bleeding is spotting. She has not been passing clots. She has not been passing tissue. Nothing aggravates the symptoms. She has tried nothing for the symptoms.   RN Note:  Expand All Collapse All   Was seen at primary care yesterday. Had + UPT, bleeding started this AM, when she wipes. "Just a little bit" Feels a little bit of pressure.          OB History    Gravida Para Term Preterm AB TAB SAB Ectopic Multiple Living   5 4 2 2      3       Past Medical History  Diagnosis Date  . Headache     "monthly" (08/10/2014)  . Diabetes  mellitus without complication   . H/O preterm delivery, currently pregnant     Past Surgical History  Procedure Laterality Date  . No past surgeries      History reviewed. No pertinent family history.  Social History  Substance Use Topics  . Smoking status: Never Smoker   . Smokeless tobacco: Never Used  . Alcohol Use: No    Allergies: No Known Allergies  Prescriptions prior to admission  Medication Sig Dispense Refill Last Dose  . glucose blood (TRUE METRIX BLOOD GLUCOSE TEST) test strip Use as instructed 100 each 12 Past Week at Unknown time  . glucose blood test strip Use as instructed 100 each 0 Past Week at Unknown time  . glucose monitoring kit (FREESTYLE) monitoring kit 1 each by Does not apply route as needed for other. 1 each 0 Past Week at Unknown time  . insulin NPH-regular Human (NOVOLIN 70/30) (70-30) 100 UNIT/ML injection Inject 15 Units into the skin 2 (two) times daily with a meal. (Patient taking differently: Inject 15 Units into the skin 2 (two) times daily with a meal. Pt states she only uses it when needed) 3 vial 3 Past Week at Unknown time  . Insulin Syringe-Needle U-100 30G X 1/2" 0.5 ML MISC Use as directed 10 each 0 Past Week at Unknown time  . Lancets (FREESTYLE) lancets Use as instructed 100 each 12 Past Week at Unknown time  .  metFORMIN (GLUCOPHAGE) 1000 MG tablet Take 1 tablet (1,000 mg total) by mouth 2 (two) times daily with a meal. 60 tablet 3 Past Week at Unknown time  . Prenatal Vit-Fe Fumarate-FA (PRENATAL MULTIVITAMIN) TABS tablet Take 1 tablet by mouth daily at 12 noon.   08/02/2015 at Unknown time   Medical, Surgical, Family and Social histories reviewed and are listed above.  Medications and allergies reviewed.   Review of Systems  Constitutional: Negative for fever, chills and malaise/fatigue.  Gastrointestinal: Negative for nausea, vomiting, abdominal pain, diarrhea and constipation.  Genitourinary: Positive for vaginal bleeding.        Vaginal bleeding   Musculoskeletal: Negative for back pain.  Neurological: Negative for dizziness, weakness and headaches.  other systems negative  Physical Exam   Blood pressure 135/59, pulse 80, temperature 98.7 F (37.1 C), temperature source Oral, resp. rate 16, last menstrual period 06/20/2015.  Physical Exam  Constitutional: She is oriented to person, place, and time. She appears well-developed and well-nourished. No distress.  HENT:  Head: Normocephalic.  Cardiovascular: Normal rate and regular rhythm.  Exam reveals no gallop and no friction rub.   No murmur heard. Respiratory: Effort normal and breath sounds normal. No respiratory distress. She has no wheezes. She has no rales. She exhibits no tenderness.  GI: Soft. She exhibits no distension and no mass. There is no tenderness. There is no rebound and no guarding.  Genitourinary: Vaginal discharge found.  Small blood in vault Cervix closed Uterus small and nontender adnexae nontender   Musculoskeletal: Normal range of motion.  Neurological: She is alert and oriented to person, place, and time.  Skin: Skin is warm and dry.  Psychiatric: She has a normal mood and affect.    MAU Course  Procedures  MDM This bleeding could represent a normal pregnancy with bleeding, spontaneous abortion or even an ectopic which can be life-threatening.   Cultures were done to rule out pelvic infection Blood drawn for Quant HCG, CBC, ABO/Rh US done Results for orders placed or performed during the hospital encounter of 08/03/15 (from the past 24 hour(s))  Urinalysis, Routine w reflex microscopic (not at [facility])     Status: Abnormal   Collection Time: 08/03/15  9:50 AM  Result Value Ref Range   Color, Urine YELLOW YELLOW   APPearance HAZY (A) CLEAR   Specific Gravity, Urine 1.010 1.005 - 1.030   pH 7.0 5.0 - 8.0   Glucose, UA NEGATIVE NEGATIVE mg/dL   Hgb urine dipstick MODERATE (A) NEGATIVE   Bilirubin Urine NEGATIVE NEGATIVE    Ketones, ur NEGATIVE NEGATIVE mg/dL   Protein, ur NEGATIVE NEGATIVE mg/dL   Urobilinogen, UA 0.2 0.0 - 1.0 mg/dL   Nitrite NEGATIVE NEGATIVE   Leukocytes, UA LARGE (A) NEGATIVE  Urine microscopic-add on     Status: Abnormal   Collection Time: 08/03/15  9:50 AM  Result Value Ref Range   Squamous Epithelial / LPF FEW (A) RARE   WBC, UA 7-10 <3 WBC/hpf   RBC / HPF 0-2 <3 RBC/hpf   Bacteria, UA FEW (A) RARE  Wet prep, genital     Status: Abnormal   Collection Time: 08/03/15 10:27 AM  Result Value Ref Range   Yeast Wet Prep HPF POC NONE SEEN NONE SEEN   Trich, Wet Prep NONE SEEN NONE SEEN   Clue Cells Wet Prep HPF POC NONE SEEN NONE SEEN   WBC, Wet Prep HPF POC FEW (A) NONE SEEN  CBC     Status: None  Collection Time: 08/03/15 10:44 AM  Result Value Ref Range   WBC 8.1 4.0 - 10.5 K/uL   RBC 4.29 3.87 - 5.11 MIL/uL   Hemoglobin 12.2 12.0 - 15.0 g/dL   HCT 36.7 36.0 - 46.0 %   MCV 85.5 78.0 - 100.0 fL   MCH 28.4 26.0 - 34.0 pg   MCHC 33.2 30.0 - 36.0 g/dL   RDW 14.2 11.5 - 15.5 %   Platelets 233 150 - 400 K/uL  hCG, quantitative, pregnancy     Status: Abnormal   Collection Time: 08/03/15 10:44 AM  Result Value Ref Range   hCG, Beta Chain, Quant, S 8211 (H) <5 mIU/mL  ABO/Rh     Status: None   Collection Time: 08/03/15 10:44 AM  Result Value Ref Range   ABO/RH(D) O POS    US Ob Comp Less 14 Wks  08/03/2015   CLINICAL DATA:  Pregnant, bleeding  EXAM: OBSTETRIC <14 WK [place] AND TRANSVAGINAL OB US  TECHNIQUE: Both transabdominal and transvaginal ultrasound examinations were performed for complete evaluation of the gestation as well as the maternal uterus, adnexal regions, and pelvic cul-de-sac. Transvaginal technique was performed to assess early pregnancy.  COMPARISON:  None.  FINDINGS: Intrauterine gestational sac: Visualized/normal in shape.  Yolk sac:  Not visualized  Embryo:  Not visualized  MSD: 8.3  mm   5 w   3  d  Maternal uterus/adnexae: Small subchronic hemorrhage.  Right ovary  is within normal limits.  Left ovary is poorly visualized but grossly unremarkable.  No free fluid.  IMPRESSION: Single intrauterine gestation, measuring 5 weeks 3 days by mean sac diameter.  No yolk sac or fetal pole is visualized.  Consider follow-up pelvic ultrasound in 14 days to confirm viability as clinically warranted.   Electronically Signed   By: Julian Hy M.D.   On: 08/03/2015 12:17   US Ob Transvaginal  08/03/2015   CLINICAL DATA:  Pregnant, bleeding  EXAM: OBSTETRIC <14 WK [place] AND TRANSVAGINAL OB US  TECHNIQUE: Both transabdominal and transvaginal ultrasound examinations were performed for complete evaluation of the gestation as well as the maternal uterus, adnexal regions, and pelvic cul-de-sac. Transvaginal technique was performed to assess early pregnancy.  COMPARISON:  None.  FINDINGS: Intrauterine gestational sac: Visualized/normal in shape.  Yolk sac:  Not visualized  Embryo:  Not visualized  MSD: 8.3  mm   5 w   3  d  Maternal uterus/adnexae: Small subchronic hemorrhage.  Right ovary is within normal limits.  Left ovary is poorly visualized but grossly unremarkable.  No free fluid.  IMPRESSION: Single intrauterine gestation, measuring 5 weeks 3 days by mean sac diameter.  No yolk sac or fetal pole is visualized.  Consider follow-up pelvic ultrasound in 14 days to confirm viability as clinically warranted.   Electronically Signed   By: Julian Hy M.D.   On: 08/03/2015 12:17     Assessment and Plan  A:  Single gestational sac with no yolk sac or embryo seen       Cannot rule out ectopic pregnancy yet       First trimester bleeding.   P;  Discussed findings with patient via interpretor.        Strict ectopic precautions reviewed       Patient is to come back in 2 days for followup Quant HCG       Probably need repeat US in 7-10 days.  [facility] 08/03/2015, 10:47 AM

## 2015-08-04 LAB — HIV ANTIBODY (ROUTINE TESTING W REFLEX): HIV SCREEN 4TH GENERATION: NONREACTIVE

## 2015-08-05 ENCOUNTER — Inpatient Hospital Stay (HOSPITAL_COMMUNITY)
Admission: AD | Admit: 2015-08-05 | Discharge: 2015-08-05 | Disposition: A | Discharge status: Home / Self Care | Payer: Self-pay | Source: Ambulatory Visit | Attending: Obstetrics & Gynecology | Admitting: Obstetrics & Gynecology

## 2015-08-05 ENCOUNTER — Inpatient Hospital Stay (HOSPITAL_COMMUNITY): Payer: Medicaid Other

## 2015-08-05 DIAGNOSIS — O0281 Inappropriate change in quantitative human chorionic gonadotropin (hCG) in early pregnancy: Secondary | ICD-10-CM | POA: Insufficient documentation

## 2015-08-05 DIAGNOSIS — O200 Threatened abortion: Secondary | ICD-10-CM

## 2015-08-05 DIAGNOSIS — O3680X0 Pregnancy with inconclusive fetal viability, not applicable or unspecified: Secondary | ICD-10-CM

## 2015-08-05 LAB — HCG, QUANTITATIVE, PREGNANCY: hCG, Beta Chain, Quant, S: 10166 m[IU]/mL — ABNORMAL HIGH (ref <5)

## 2015-08-05 NOTE — Discharge Instructions (Signed)
Amenaza de aborto (Threatened Miscarriage) La amenaza de aborto se produce cuando hay hemorragia vaginal durante las primeras 20semanas de [place], pero el embarazo no se interrumpe. El [employer] har pruebas para asegurarse de que el embarazo contine. La causa de la hemorragia puede ser desconocida. Este trastorno no significa que Best boy. Sin embargo, Counsellor riesgo de que el embarazo se interrumpa (aborto completo). CUIDADOS EN EL HOGAR   Asegrese de asistir a todas las citas de cuidados prenatales con el mdico.  Descanse lo suficiente.  No tenga relaciones sexuales ni use tampones si tiene hemorragia vaginal.  No se haga duchas vaginales.  No fume ni consuma drogas.  No beba alcohol.  Evite la cafena. SOLICITE AYUDA SI:  Tiene una hemorragia leve de la vagina.  Tiene dolor o clicos abdominales.  Tiene fiebre. SOLICITE AYUDA DE INMEDIATO SI:   Tiene hemorragia abundante de la vagina.  Elimina cogulos de sangre por la vagina.  Tiene mucho dolor en el abdomen o la parte baja de la espalda, clicos abdominales o calambres en la parte baja de la espalda.  Tiene fiebre, escalofros y mucho dolor abdominal. ASEGRESE DE QUE:   Comprende estas instrucciones.  Controlar su afeccin.  Recibir ayuda de inmediato si no mejora o si empeora. Document Released: 01/03/2011 Document Revised: 12/06/2013 [facility] Patient Information 2015 [place]. This information is not intended to replace advice given to you by your health care provider. Make sure you discuss any questions you have with your health care provider.

## 2015-08-05 NOTE — MAU Provider Note (Signed)
S:  [name] is a 34 y.o. female [phone] at [redacted]w[redacted]d presenting to MAU for a follow up beta hcg level. She was seen 2 days ago for vaginal bleeding. She denies pain or bleeding at this time. This is a highly desired pregnancy.   Spanish interpretor used.   O:  GENERAL: Well-developed, well-nourished female in no acute distress.  LUNGS: Effort normal SKIN: Warm, dry and without erythema PSYCH: Normal mood and affect  Filed Vitals:   08/05/15 1026  BP: 115/60  Pulse: 75  Temp: 98.3 F (36.8 C)  Resp: 16    MDM:  Beta hcg level 8/19: 8211 Beta hcg level 8/21: 10166  Discussed labs with Dr. Gala Romney; will send for [place].  Patient awaiting [place]; report given to Lesia Hausen NP who resumes care of the patient.  13:35  Discussed patient, BHCG and U/S results with Dr. Gala Romney.  Order given for repeat U/S and appt in the [facility] in 10 days   A: Inappropriate rise in BHCG     Intrauterine Gestational Sac==ectopic pregnancy ruled out   P:  Repeat U/S and [facility] CLINIC follow up in 10 days.      Return for worsening sxs of heavy bleeding and pain.    Lezlie Lye, NP 08/05/2015 12:11 PM

## 2015-08-05 NOTE — MAU Note (Signed)
Patient presents for followup labwork; Denies pain, bleeding or discharge.

## 2015-08-06 LAB — GC/CHLAMYDIA PROBE AMP (~~LOC~~) NOT AT ARMC
Chlamydia: NEGATIVE
Neisseria Gonorrhea: NEGATIVE

## 2015-08-07 ENCOUNTER — Inpatient Hospital Stay (HOSPITAL_COMMUNITY)
Admission: AD | Admit: 2015-08-07 | Discharge: 2015-08-07 | Disposition: A | Discharge status: Home / Self Care | Payer: Self-pay | Source: Ambulatory Visit | Attending: Obstetrics & Gynecology | Admitting: Obstetrics & Gynecology

## 2015-08-07 ENCOUNTER — Telehealth: Payer: Self-pay | Admitting: General Practice

## 2015-08-07 ENCOUNTER — Encounter (HOSPITAL_COMMUNITY): Payer: Self-pay

## 2015-08-07 ENCOUNTER — Inpatient Hospital Stay (HOSPITAL_COMMUNITY): Payer: Medicaid Other

## 2015-08-07 DIAGNOSIS — Z794 Long term (current) use of insulin: Secondary | ICD-10-CM | POA: Insufficient documentation

## 2015-08-07 DIAGNOSIS — O209 Hemorrhage in early pregnancy, unspecified: Secondary | ICD-10-CM | POA: Insufficient documentation

## 2015-08-07 DIAGNOSIS — O468X1 Other antepartum hemorrhage, first trimester: Secondary | ICD-10-CM

## 2015-08-07 DIAGNOSIS — O418X10 Other specified disorders of amniotic fluid and membranes, first trimester, not applicable or unspecified: Secondary | ICD-10-CM

## 2015-08-07 DIAGNOSIS — Z3A01 Less than 8 weeks gestation of pregnancy: Secondary | ICD-10-CM | POA: Insufficient documentation

## 2015-08-07 DIAGNOSIS — E119 Type 2 diabetes mellitus without complications: Secondary | ICD-10-CM | POA: Insufficient documentation

## 2015-08-07 LAB — URINE MICROSCOPIC-ADD ON

## 2015-08-07 LAB — URINALYSIS, ROUTINE W REFLEX MICROSCOPIC
BILIRUBIN URINE: NEGATIVE
Glucose, UA: NEGATIVE mg/dL
Ketones, ur: NEGATIVE mg/dL
NITRITE: NEGATIVE
Protein, ur: NEGATIVE mg/dL
SPECIFIC GRAVITY, URINE: 1.01 (ref 1.005–1.030)
UROBILINOGEN UA: 0.2 mg/dL (ref 0.0–1.0)
pH: 6 (ref 5.0–8.0)

## 2015-08-07 NOTE — Discharge Instructions (Signed)
Hematoma subcorinico (Subchorionic Hematoma) Un hematoma subcorinico es una acumulacin de sangre entre la pared externa de la placenta y la pared interna del la matriz (tero). La placenta es el rgano que conecta el feto a la pared del tero. La placenta realiza la funcin de alimentacin, respiracin (oxgeno al feto) y el trabajo de eliminacin de desechos (excrecin) del feto.  Un hematoma subcorinico es la anormalidad ms frecuente encontrada en una ecografa durante el primer trimestre o principios del segundo trimestre del embarazo. Si ha habido poca o ninguna hemorragia vaginal, generalmente los pequeos hematomas se reducen por su propia cuenta y no afectan al beb ni al [name]. La sangre es absorbida gradualmente durante una o [place]. Cuando la hemorragia comienza ms tarde en el embarazo o el hematoma es ms grande o se produce en una paciente de edad avanzada, el resultado puede no ser tan bueno. Los grandes hematomas pueden agrandarse an ms y [place] las posibilidades de aborto espontneo. El hematoma subcorinico tambin aumenta el riesgo de desprendimiento precoz de la placenta del tero, muerte fetal y Environmental education officer. INSTRUCCIONES PARA EL CUIDADO EN EL HOGAR   Repose en cama si el mdico se lo recomienda. Aunque el reposo en cama no evitar la hemorragia o un aborto espontneo, su mdico puede recomendarlo.  Evite levantar objetos pesados (ms de 10 libras [4,5 kg]), hacer ejercicio, tener relaciones sexuales o realizar duchas vaginales segn se lo indique el profesional.  Lleve un registro de la cantidad y Energy manager de remojo (saturacin) de las toallas higinicas que Medical laboratory scientific officer. Anote esta informacin.  No use tampones.  Cumpla con todas las visitas de control, segn le indique su mdico. El profesional podr pedirle que se realice anlisis de seguimiento, pruebas de [place] o [place]. SOLICITE ATENCIN MDICA DE INMEDIATO SI:   Siente calambres intensos en el  estmago, en la espalda, en el abdomen o en la pelvis.  Tiene fiebre.  Elimina cogulos o tejidos grandes. Guarde los tejidos para que su mdico los vea.  Si la hemorragia aumenta o siente mareos, debilidad o tiene episodios de [place]. Document Released: 03/19/2009 Document Revised: 09/21/2013 [facility] Patient Information 2015 [place]. This information is not intended to replace advice given to you by your health care provider. Make sure you discuss any questions you have with your health care provider.  Reposo plvico  (Pelvic Rest) El reposo plvico se recomienda a las mujeres cuando:   La placenta cubre parcial o completamente la abertura del cuello del tero (placenta previa).  Hay sangrado entre la pared del tero y el saco amnitico en el primer trimestre (hemorragia subcorinica).  El cuello uterino comienza a abrirse sin iniciarse el trabajo de parto (cuello uterino incompetente, insuficiencia cervical).  El [place] de parto se inicia muy pronto (parto prematuro). INSTRUCCIONES PARA EL CUIDADO EN EL HOGAR   No tenga relaciones sexuales, estimulacin, ni orgasmos.  No use tampones, no se haga duchas vaginales ni coloque ningn objeto en la vagina.  No levante objetos que pesen ms de 10 libras (4,5 kg).  Evite las actividades extenuantes o tensionar los msculos de la pelvis. SOLICITE ATENCIN MDICA SI:   Tiene un sangrado vaginal durante el embarazo. Considrelo como una posible emergencia.  Siente clicos en la zona baja del estmago (ms fuertes que los clicos menstruales).  Nota flujo vaginal (acuoso, con moco o [place]).  Siente un dolor en la espalda leve y sordo.  Tiene contracciones regulares o endurecimiento del tero. [name] DE [place]ron Spates  SI:  Observa sangrado vaginal y tiene placenta previa.  Document Released: 08/25/2012 [facility] Patient Information 2015 [place]. This information is not intended to replace advice  given to you by your health care provider. Make sure you discuss any questions you have with your health care provider.

## 2015-08-07 NOTE — Telephone Encounter (Signed)
Patient called and left message in [place]. No one available to listen to message. Patient did mention something about an ultrasound appt.

## 2015-08-07 NOTE — MAU Provider Note (Signed)
History     CSN: [id number]  Arrival date and time: 08/07/15 2016   First Provider Initiated Contact with Patient 08/07/15 2228      Chief Complaint  Patient presents with  . Vaginal Bleeding   HPI  Ms. KORRYN PANCOAST is a 34 y.o. [phone] at 31w0dwho presents to MAU today with complaint of vaginal bleeding. The patient was seen in MAU previously and had [place] IUGS and YS with moderate [place] She states that she has continued to have bleeding. She states that bleeding is a small amount and has not increased since last visit in MAU. She denies abdominal pain, fever or other vaginal discharge. She denies recent sexual intercourse.   OB History    Gravida Para Term Preterm AB TAB SAB Ectopic Multiple Living   5 4 2 2  0     3      Past Medical History  Diagnosis Date  . Headache     "monthly" (08/10/2014)  . Diabetes mellitus without complication   . H/O preterm delivery, currently pregnant     Past Surgical History  Procedure Laterality Date  . No past surgeries      No family history on file.  Social History  Substance Use Topics  . Smoking status: Never Smoker   . Smokeless tobacco: Never Used  . Alcohol Use: No    Allergies: No Known Allergies  Prescriptions prior to admission  Medication Sig Dispense Refill Last Dose  . glucose blood (TRUE METRIX BLOOD GLUCOSE TEST) test strip Use as instructed 100 each 12 Past Week at Unknown time  . glucose blood test strip Use as instructed 100 each 0 Past Week at Unknown time  . glucose monitoring kit (FREESTYLE) monitoring kit 1 each by Does not apply route as needed for other. 1 each 0 Past Week at Unknown time  . insulin NPH-regular Human (NOVOLIN 70/30) (70-30) 100 UNIT/ML injection Inject 15 Units into the skin 2 (two) times daily with a meal. (Patient taking differently: Inject 15 Units into the skin 2 (two) times daily with a meal. Pt states she only uses it when needed) 3 vial 3 Past Week at Unknown time  .  Insulin Syringe-Needle U-100 30G X 1/2" 0.5 ML MISC Use as directed 10 each 0 Past Week at Unknown time  . Lancets (FREESTYLE) lancets Use as instructed 100 each 12 Past Week at Unknown time  . metFORMIN (GLUCOPHAGE) 1000 MG tablet Take 1 tablet (1,000 mg total) by mouth 2 (two) times daily with a meal. 60 tablet 3 Past Week at Unknown time  . Prenatal Vit-Fe Fumarate-FA (PRENATAL MULTIVITAMIN) TABS tablet Take 1 tablet by mouth daily at 12 noon.   08/02/2015 at Unknown time    Review of Systems  Constitutional: Negative for fever.  Gastrointestinal: Negative for abdominal pain.  Genitourinary:       + vaginal bleeding Neg - vaginal discharge   Physical Exam   Blood pressure 116/74, pulse 76, temperature 98.6 F (37 C), temperature source Oral, resp. rate 15, height 5' 2"  (1.575 m), weight 152 lb (68.947 kg), last menstrual period 06/20/2015, SpO2 100 %.  Physical Exam  Nursing note and vitals reviewed. Constitutional: She is oriented to person, place, and time. She appears well-developed and well-nourished. No distress.  HENT:  Head: Normocephalic and atraumatic.  Cardiovascular: Normal rate.   Respiratory: Effort normal.  GI: Soft. She exhibits no distension and no mass. There is no tenderness. There is no rebound and  no guarding.  Neurological: She is alert and oriented to person, place, and time.  Skin: Skin is warm and dry. No erythema.  Psychiatric: She has a normal mood and affect.   Results for orders placed or performed during the hospital encounter of 08/07/15 (from the past 24 hour(s))  Urinalysis, Routine w reflex microscopic (not at [facility])     Status: Abnormal   Collection Time: 08/07/15  8:42 PM  Result Value Ref Range   Color, Urine STRAW (A) YELLOW   APPearance CLEAR CLEAR   Specific Gravity, Urine 1.010 1.005 - 1.030   pH 6.0 5.0 - 8.0   Glucose, UA NEGATIVE NEGATIVE mg/dL   Hgb urine dipstick MODERATE (A) NEGATIVE   Bilirubin Urine NEGATIVE NEGATIVE   Ketones,  ur NEGATIVE NEGATIVE mg/dL   Protein, ur NEGATIVE NEGATIVE mg/dL   Urobilinogen, UA 0.2 0.0 - 1.0 mg/dL   Nitrite NEGATIVE NEGATIVE   Leukocytes, UA LARGE (A) NEGATIVE  Urine microscopic-add on     Status: Abnormal   Collection Time: 08/07/15  8:42 PM  Result Value Ref Range   Squamous Epithelial / LPF RARE RARE   WBC, UA 11-20 <3 WBC/hpf   RBC / HPF 0-2 <3 RBC/hpf   Bacteria, UA FEW (A) RARE    US Ob Transvaginal  08/07/2015   CLINICAL DATA:  Heavy vaginal bleeding. Estimated gestational age by LMP is 6 weeks 6 days. Quantitative beta HCG on 08/05/2015 was 10,166. No repeat levels available.  EXAM: TRANSVAGINAL OB ULTRASOUND  TECHNIQUE: Transvaginal ultrasound was performed for complete evaluation of the gestation as well as the maternal uterus, adnexal regions, and pelvic cul-de-sac.  COMPARISON:  08/05/2015  FINDINGS: Intrauterine gestational sac: A single intrauterine gestational sac is visualized.  Yolk sac: Appears to be present. Poorly visualized on images available.  Embryo:  Not identified.  Cardiac Activity: Not identified.  MSD: 13.3  mm   6 w   1  d  Maternal uterus/adnexae: Uterus is retroverted. Moderate-sized subchorionic hemorrhage is present. No significant change since prior study. The right ovary is visualized and appears normal. Left ovary is not identified. No abnormal adnexal masses. No free pelvic fluid.  IMPRESSION: Single intrauterine gestational sac again demonstrated with probable yolk sac. Fetal pole is not identified. Size of yolk sac is consistent with appropriate interval growth since the previous studies. Moderate size subchorionic hemorrhage again demonstrated.   Electronically Signed   By: Lucienne Capers M.D.   On: 08/07/2015 22:24    MAU Course  Procedures None  MDM [place] today shows continued growth at appropriate interval, continued bleeding is expected with [facility]  Assessment and Plan  A: IUGS and YS at 94w6dSubchorionic hemorrhage  P: Discharge  home Bleeding precautions discussed Pelvic rest advised Patient advised to follow-up with [facility] as scheduled to start prenatal care Patient may return to MAU as needed or if her condition were to change or worsen   JLuvenia Redden PA-C  08/07/2015, 10:28 PM

## 2015-08-07 NOTE — MAU Note (Signed)
Pt reports vaginal bleeding x 30 minutes. States it is on the tissue when she wipes. Denies pain.

## 2015-08-08 NOTE — Telephone Encounter (Signed)
Contacted patient with [name], Pt states she was informed to call and change ultrasound appointment from 8/30 to 8/31 @1300  prior to visit in clinic.  Appointment in ultrasound changed to 8/31 @ 1300.  Pt aware of change, pt verbalizes understanding.

## 2015-08-09 ENCOUNTER — Ambulatory Visit: Payer: Medicaid Other | Admitting: Internal Medicine

## 2015-08-13 ENCOUNTER — Ambulatory Visit: Payer: Medicaid Other | Admitting: Internal Medicine

## 2015-08-14 ENCOUNTER — Other Ambulatory Visit: Payer: Self-pay

## 2015-08-14 ENCOUNTER — Ambulatory Visit (HOSPITAL_COMMUNITY): Payer: Medicaid Other

## 2015-08-14 MED ORDER — TRUE METRIX AIR GLUCOSE METER W/DEVICE KIT: 1.0000 | PACK | Freq: Three times a day (TID) | Status: DC

## 2015-08-15 ENCOUNTER — Encounter: Payer: Self-pay | Admitting: Obstetrics & Gynecology

## 2015-08-15 ENCOUNTER — Ambulatory Visit (HOSPITAL_COMMUNITY)
Admission: RE | Admit: 2015-08-15 | Discharge: 2015-08-15 | Disposition: A | Discharge status: Home / Self Care | Payer: Medicaid Other | Source: Ambulatory Visit | Attending: Family Medicine | Admitting: Family Medicine

## 2015-08-15 ENCOUNTER — Other Ambulatory Visit: Payer: Self-pay | Admitting: Obstetrics & Gynecology

## 2015-08-15 ENCOUNTER — Ambulatory Visit (INDEPENDENT_AMBULATORY_CARE_PROVIDER_SITE_OTHER): Payer: Self-pay | Admitting: Obstetrics & Gynecology

## 2015-08-15 VITALS — BP 131/58 | HR 71 | Temp 98.1°F | Ht 61.0 in | Wt 151.7 lb

## 2015-08-15 DIAGNOSIS — O3680X1 Pregnancy with inconclusive fetal viability, fetus 1: Secondary | ICD-10-CM

## 2015-08-15 DIAGNOSIS — O021 Missed abortion: Secondary | ICD-10-CM

## 2015-08-15 DIAGNOSIS — R1032 Left lower quadrant pain: Secondary | ICD-10-CM | POA: Insufficient documentation

## 2015-08-15 DIAGNOSIS — Z3A01 Less than 8 weeks gestation of pregnancy: Secondary | ICD-10-CM | POA: Insufficient documentation

## 2015-08-15 DIAGNOSIS — O26891 Other specified pregnancy related conditions, first trimester: Secondary | ICD-10-CM | POA: Insufficient documentation

## 2015-08-15 DIAGNOSIS — Z3490 Encounter for supervision of normal pregnancy, unspecified, unspecified trimester: Secondary | ICD-10-CM

## 2015-08-15 MED ORDER — MISOPROSTOL 200 MCG PO TABS: 800.0000 ug | ORAL_TABLET | Freq: Two times a day (BID) | ORAL | Status: DC

## 2015-08-15 MED ORDER — IBUPROFEN 600 MG PO TABS: 600.0000 mg | ORAL_TABLET | Freq: Four times a day (QID) | ORAL | Status: DC | PRN

## 2015-08-15 NOTE — Patient Instructions (Signed)
Aborto incompleto (Incomplete Miscarriage) Un aborto espontneo es la prdida repentina de un beb en gestacin (feto) antes de la semana 20 del embarazo. En un aborto espontneo, partes del feto o la placenta (alumbramiento) permanecen en el cuerpo.  El aborto espontneo puede ser Ardelia Mems experiencia que afecte emocionalmente a Geologist, engineering. Hable con su mdico si tiene preguntas sobre el aborto espontneo, el proceso de duelo y los planes futuros de [place]. CAUSAS   Algunos problemas cromosmicos pueden hacer imposible que el beb se desarrolle normalmente. Los problemas con los genes o cromosomas del beb son, en la mayora de los [place], el resultado de errores que se producen, al azar, cuando el embrin se divide y crece. Estos problemas no se heredan de los [place].  Infeccin en el cuello del tero.  Problemas hormonales.  Problemas en el cuello del tero, como tener un tero incompetente. Esto ocurre cuando los tejidos no son lo suficientemente fuertes como para Risk manager.  Problemas del tero, como un tero con forma anormal, los fibromas o anormalidades congnitas.  Ciertas enfermedades crnicas.  No fume, no beba alcohol, ni consuma drogas.  Traumatismos. SNTOMAS   Sangrado o manchado vaginal, con o sin clicos o dolor.  Dolor o clicos en el abdomen o en la cintura.  Eliminacin de lquido, tejidos o cogulos grandes por la vagina. DIAGNSTICO  El [employer] har un examen fsico. Tambin le indicar una ecografa para confirmar el aborto. Es posible que se realicen anlisis de [place]. TRATAMIENTO   Generalmente se realiza un procedimiento de dilatacin y curetaje (D y C). Durante el procedimiento de dilatacin y curetaje, el cuello del tero se abre (dilata) y se retira todo resto de tejido fetal o placentario del tero.  Si hay una infeccin, le recetarn antibiticos. Posiblemente le receten otros medicamentos para reducir Occupational psychologist) el tamao del tero si hay  mucha hemorragia.  Si su tipo de sangre es Rh negativo y el del beb es Rh positivo, necesitar una inyeccin de inmunoglobulina Rho(D). Esta inyeccin proteger a los futuros bebs de tener problemas de compatibilidad Rh en futuros embarazos.  Probablemente le indiquen reposo. Esto significa que debe quedarse en cama y levantarse nicamente para ir al bao. INSTRUCCIONES PARA EL CUIDADO EN EL HOGAR   Haga reposo segn las indicaciones del mdico.  Limite las actividades segn las indicaciones del mdico. Es posible que se le permita retomar las actividades livianas si no se le realiz un curetaje, Comptroller tratamiento adicional.  Lleve un registro de la cantidad de toallas sanitarias que [place] por da. Observe cun impregnadas (saturadas) estn. Registre esta informacin.  No  use tampones.  No se haga duchas vaginales ni tenga relaciones sexuales hasta que el mdico la autorice.  Asista a todas las citas de seguimiento para una nueva evaluacin y para Chief Strategy Officer.  Slo tome medicamentos de venta libre o recetados para Glass blower/designer, Health and safety inspector o bajar la fiebre, segn las indicaciones de su mdico.  [employer] antibiticos como le indic el mdico. Asegrese de que finaliza la prescripcin completa aunque se sienta mejor. SOLICITE ATENCIN MDICA DE INMEDIATO SI:   Siente calambres intensos en el estmago, en la espalda o en el abdomen.  Le sube la fiebre sin motivo (asegrese de Museum/gallery curator las cifras).  Elimina cogulos grandes o tejidos (consrvelos para que el [place]).  La hemorragia aumenta.  Se siente mareada, dbil o tiene episodios de desmayo. ASEGRESE DE QUE:   Comprende estas instrucciones.  Controlar su afeccin.  Recibir ayuda de inmediato si no mejora o si empeora. Document Released: 12/01/2005 Document Revised: 09/21/2013 [facility] Patient Information 2015 [place], [place]. This information is not intended to replace advice given  to you by your health care provider. Make sure you discuss any questions you have with your health care provider. Aborto espontneo  (Miscarriage) El aborto espontneo es la prdida de un beb que no ha nacido (feto) antes de la semana 20 del Media planner. La mayor parte de estos abortos ocurre en los primeros 3 meses. En algunos casos ocurre antes de que la mujer sepa que est [place]. Tambin se denomina "aborto espontneo" o "prdida prematura del embarazo". El aborto espontneo puede ser Ardelia Mems experiencia que afecte emocionalmente a Geologist, engineering. Converse con su mdico si tiene dudas, cmo es el proceso de [place], y sobre planes futuros de Media planner.  CAUSAS   Algunos problemas cromosmicos pueden hacer imposible que el beb se desarrolle normalmente. Los problemas con los genes o cromosomas del beb son generalmente el resultado de errores que se producen, por casualidad, cuando el embrin se divide y crece. Estos problemas no se heredan de los [place].  Infeccin en el cuello del tero.   Problemas hormonales.   Problemas en el cuello del tero, como tener un tero incompetente. Esto ocurre cuando los tejidos no son lo suficientemente fuertes como para Risk manager.   Problemas del tero, como un tero con forma anormal, los fibromas o anormalidades congnitas.   Ciertas enfermedades crnicas.   No fume, no beba alcohol, ni consuma drogas.   Traumatismos  A veces, la causa es desconocida.  SNTOMAS   Sangrado o manchado vaginal, con o sin clicos o dolor.  Dolor o clicos en el abdomen o en la cintura.  Eliminacin de lquido, tejidos o cogulos grandes por la vagina. DIAGNSTICO  El [employer] har un examen fsico. Tambin le indicar una ecografa para confirmar el aborto. Es posible que se realicen anlisis de [place].  TRATAMIENTO   En algunos casos el tratamiento no es necesario, si se eliminan naturalmente todos los tejidos embrionarios que se encontraban en el  tero. Si el feto o la placenta quedan dentro del tero (aborto incompleto), pueden infectarse, los tejidos que quedan pueden infectarse y deben retirarse. Generalmente se realiza un procedimiento de dilatacin y curetaje (D y C). Durante el procedimiento de dilatacin y curetaje, el cuello del tero se abre (dilata) y se retira cualquier resto de tejido fetal o placentario del tero.  Si hay una infeccin, le recetarn antibiticos. Podrn recetarle otros medicamentos para reducir el tamao del tero (contraerlo) si hay una mucho sangrado.  Si su sangre es Rh negativa y su beb es Rh positivo, usted necesitar la inyeccin de inmunoglobulina Rh. Esta inyeccin proteger a los futuros bebs de tener problemas de compatibilidad Rh en futuros embarazos. INSTRUCCIONES PARA EL CUIDADO EN EL HOGAR   El mdico le indicar reposo en cama o le permitir Automotive engineer. Vuelva a la actividad lentamente o segn las indicaciones de su mdico.  Pdale a alguien que la ayude con las responsabilidades familiares y del hogar durante este tiempo.   Lleve un registro de la cantidad y la saturacin de las toallas higinicas que Medical laboratory scientific officer. Anote esta informacin   No use tampones. No No se haga duchas vaginales ni tenga relaciones sexuales hasta que el mdico la autorice.   Slo tome medicamentos de venta libre o recetados para Glass blower/designer o Health and safety inspector,  segn las indicaciones de su mdico.   No tome aspirina. La aspirina puede ocasionar hemorragias.   Concurra puntualmente a las citas de control con el mdico.   Si usted o su pareja tienen dificultades con el duelo, hable con su mdico para buscar la [employer] ayude a Academic librarian la prdida del Media planner. Permtase el tiempo suficiente de duelo antes de quedar embarazada nuevamente.  SOLICITE ATENCIN MDICA DE INMEDIATO SI:   Siente calambres intensos o dolor en la espalda o en el abdomen.  Tiene  fiebre.  Elimina grandes cogulos de [place] (del tamao de una nuez o ms) o tejidos por la vagina. Guarde lo que ha eliminado para que su mdico lo examine.   La hemorragia aumenta.   [place] Fast secrecin vaginal espesa y con mal olor.  Se siente mareada, dbil, o se desmaya.   Siente escalofros.  ASEGRESE DE QUE:   Comprende estas instrucciones.  Controlar su enfermedad.  Solicitar ayuda de inmediato si no mejora o si empeora. Document Released: 09/10/2005 Document Revised: 03/28/2013 [facility] Patient Information 2015 [place], [place]. This information is not intended to replace advice given to you by your health care provider. Make sure you discuss any questions you have with your health care provider.

## 2015-08-15 NOTE — Progress Notes (Signed)
Lenard Lance used for interpreter

## 2015-08-15 NOTE — Progress Notes (Signed)
Patient ID: [name], female   DOB: 17-Jan-1981, 34 y.o.   MRN: [id number] History:  34 y.o. [id number] here today for f/u of first trimester bleeding.  Pt last had sx 2 days prev. Pt denies pain currently.   The following portions of the patient's history were reviewed and updated as appropriate: allergies, current medications, past family history, past medical history, past social history, past surgical history and problem list.  Review of Systems:  Pertinent items are noted in HPI.  Objective:  Physical Exam Blood pressure 131/58, pulse 71, temperature 98.1 F (36.7 C), height 5\' 1"  (1.549 m), weight 151 lb 11.2 oz (68.811 kg), last menstrual period 06/20/2015. Gen: NAD Abd: Soft, nontender and nondistended Pelvic: Normal appearing external genitalia; normal appearing vaginal mucosa and cervix.  Normal discharge.  Small uterus, no other palpable masses, no uterine or adnexal tenderness  Labs and Imaging US Ob Comp Less 14 Wks  08/03/2015   CLINICAL DATA:  Pregnant, bleeding  EXAM: OBSTETRIC <14 WK [place] AND TRANSVAGINAL OB US  TECHNIQUE: Both transabdominal and transvaginal ultrasound examinations were performed for complete evaluation of the gestation as well as the maternal uterus, adnexal regions, and pelvic cul-de-sac. Transvaginal technique was performed to assess early pregnancy.  COMPARISON:  None.  FINDINGS: Intrauterine gestational sac: Visualized/normal in shape.  Yolk sac:  Not visualized  Embryo:  Not visualized  MSD: 8.3  mm   5 w   3  d  Maternal uterus/adnexae: Small subchronic hemorrhage.  Right ovary is within normal limits.  Left ovary is poorly visualized but grossly unremarkable.  No free fluid.  IMPRESSION: Single intrauterine gestation, measuring 5 weeks 3 days by mean sac diameter.  No yolk sac or fetal pole is visualized.  Consider follow-up pelvic ultrasound in 14 days to confirm viability as clinically warranted.   Electronically Signed   By: Julian Hy M.D.    On: 08/03/2015 12:17   US Ob Transvaginal  08/15/2015   CLINICAL DATA:  Scan for viability. Left lower quadrant pain. LMP 06/20/2015. EDC by LMP is 03/26/2016. Patient is 8 weeks 0 days by LMP.  EXAM: TRANSVAGINAL OB ULTRASOUND  TECHNIQUE: Transvaginal ultrasound was performed for complete evaluation of the gestation as well as the maternal uterus, adnexal regions, and pelvic cul-de-sac.  COMPARISON:  08/07/2015  FINDINGS: Intrauterine gestational sac: Present  Yolk sac:  Present  Embryo: There has been development of a structure within the gestational sac separate from the yolk sac, possibly representing a small amnion or early embryo.  Cardiac Activity: Not seen  MSD: 14.5  mm   6 w   2  d  Maternal uterus/adnexae: The ovaries have a normal appearance.  IMPRESSION: 1. Gestational sac and yolk sac are definitely present. Possible early amnion or embryo. 2. Continued follow-up is recommended. Next ultrasound is suggested in 10-14 days.   Electronically Signed   By: Nolon Nations M.D.   On: 08/15/2015 13:47   US Ob Transvaginal  08/07/2015   CLINICAL DATA:  Heavy vaginal bleeding. Estimated gestational age by LMP is 6 weeks 6 days. Quantitative beta HCG on 08/05/2015 was 10,166. No repeat levels available.  EXAM: TRANSVAGINAL OB ULTRASOUND  TECHNIQUE: Transvaginal ultrasound was performed for complete evaluation of the gestation as well as the maternal uterus, adnexal regions, and pelvic cul-de-sac.  COMPARISON:  08/05/2015  FINDINGS: Intrauterine gestational sac: A single intrauterine gestational sac is visualized.  Yolk sac: Appears to be present. Poorly visualized on images available.  Embryo:  Not identified.  Cardiac Activity: Not identified.  MSD: 13.3  mm   6 w   1  d  Maternal uterus/adnexae: Uterus is retroverted. Moderate-sized subchorionic hemorrhage is present. No significant change since prior study. The right ovary is visualized and appears normal. Left ovary is not identified. No abnormal  adnexal masses. No free pelvic fluid.  IMPRESSION: Single intrauterine gestational sac again demonstrated with probable yolk sac. Fetal pole is not identified. Size of yolk sac is consistent with appropriate interval growth since the previous studies. Moderate size subchorionic hemorrhage again demonstrated.   Electronically Signed   By: Lucienne Capers M.D.   On: 08/07/2015 22:24   US Ob Transvaginal  08/05/2015   CLINICAL DATA:  Vaginal bleeding. Inappropriate rise in quantitative beta HCG levels.  EXAM: TRANSVAGINAL OB ULTRASOUND  TECHNIQUE: Transvaginal ultrasound was performed for complete evaluation of the gestation as well as the maternal uterus, adnexal regions, and pelvic cul-de-sac.  COMPARISON:  08/03/2015  FINDINGS: Intrauterine gestational sac: Visualized/normal in shape.  Yolk sac:  Visualized  Embryo:  Not visualized  MSD: 11  mm   5 w   6  d  Maternal uterus/adnexae: Retroverted uterus noted. Small to moderate subchorionic hemorrhage again noted. Both ovaries are normal in appearance. No adnexal mass or free fluid identified.  IMPRESSION: Single intrauterine gestational sac measuring 5 weeks 6 days shows appropriate interval growth, and now demonstrates a yolk sac.  Small to moderate subchorionic hemorrhage again noted.   Electronically Signed   By: Earle Gell M.D.   On: 08/05/2015 13:37   US Ob Transvaginal  08/03/2015   CLINICAL DATA:  Pregnant, bleeding  EXAM: OBSTETRIC <14 WK [place] AND TRANSVAGINAL OB US  TECHNIQUE: Both transabdominal and transvaginal ultrasound examinations were performed for complete evaluation of the gestation as well as the maternal uterus, adnexal regions, and pelvic cul-de-sac. Transvaginal technique was performed to assess early pregnancy.  COMPARISON:  None.  FINDINGS: Intrauterine gestational sac: Visualized/normal in shape.  Yolk sac:  Not visualized  Embryo:  Not visualized  MSD: 8.3  mm   5 w   3  d  Maternal uterus/adnexae: Small subchronic hemorrhage.  Right  ovary is within normal limits.  Left ovary is poorly visualized but grossly unremarkable.  No free fluid.  IMPRESSION: Single intrauterine gestation, measuring 5 weeks 3 days by mean sac diameter.  No yolk sac or fetal pole is visualized.  Consider follow-up pelvic ultrasound in 14 days to confirm viability as clinically warranted.   Electronically Signed   By: Julian Hy M.D.   On: 08/03/2015 12:17    Assessment & Plan:  Missed abortion Discussed with pt treatment options.  15 min spent id face to face discussion. Pt opts for conservative management with Cytotec Cytotec 867mcg q 12hours prn x 2 if no bleeding Motrin 600mg  q 6 hours prn Spanish interpreter used for entire visit  [name], M.D., Cherlynn June

## 2015-08-15 NOTE — Progress Notes (Signed)
[place] still not conclusive of viable IUP or miscarriage.  Please draw beta today.  I will review and see when next [place] needs to ordered.    Thanks,

## 2015-08-21 ENCOUNTER — Telehealth: Payer: Self-pay | Admitting: *Deleted

## 2015-08-21 NOTE — Telephone Encounter (Signed)
Patient stopped by clinic to see what she should do about her baby. She was told by Dr. Eugenie Norrie that she is having a miscarriage and was given rx for cytotec. Patient came to clinic because she saw in mychart that the ultrasound recommends that she have another ultrasound 7-14 days after the last one. I advised patient that we will contact Dr. Eugenie Norrie this afternoon and see what she would like patient to do. Patient agreeable to this and can be reached on her mobile number.

## 2015-08-21 NOTE — Telephone Encounter (Signed)
I sent message to Dr. Ihor Dow for input regarding patients request for further ultrasounds. She stated that patient has had two ultrasounds which showed no growth and a new ultrasound will not give [place] any additional information. Patient has the option to take cytotec or await spontaneous passage of tissue. Called patient with [place] interpreter Larkin Ina, patient informed of recommendations. Patient disconnected call after being given recommendations.

## 2015-08-27 ENCOUNTER — Ambulatory Visit: Payer: Medicaid Other

## 2015-08-31 ENCOUNTER — Encounter (HOSPITAL_COMMUNITY): Payer: Self-pay | Admitting: *Deleted

## 2015-08-31 ENCOUNTER — Inpatient Hospital Stay (HOSPITAL_COMMUNITY)
Admission: AD | Admit: 2015-08-31 | Discharge: 2015-08-31 | Disposition: A | Discharge status: Home / Self Care | Payer: Self-pay | Source: Ambulatory Visit | Attending: Obstetrics and Gynecology | Admitting: Obstetrics and Gynecology

## 2015-08-31 ENCOUNTER — Inpatient Hospital Stay (HOSPITAL_COMMUNITY): Payer: Self-pay

## 2015-08-31 DIAGNOSIS — O021 Missed abortion: Secondary | ICD-10-CM | POA: Insufficient documentation

## 2015-08-31 DIAGNOSIS — Z3A10 10 weeks gestation of pregnancy: Secondary | ICD-10-CM | POA: Insufficient documentation

## 2015-08-31 DIAGNOSIS — O039 Complete or unspecified spontaneous abortion without complication: Secondary | ICD-10-CM

## 2015-08-31 LAB — CBC
HCT: 34.8 % — ABNORMAL LOW (ref 36.0–46.0)
Hemoglobin: 11.6 g/dL — ABNORMAL LOW (ref 12.0–15.0)
MCH: 28.6 pg (ref 26.0–34.0)
MCHC: 33.3 g/dL (ref 30.0–36.0)
MCV: 85.9 fL (ref 78.0–100.0)
PLATELETS: 279 10*3/uL (ref 150–400)
RBC: 4.05 MIL/uL (ref 3.87–5.11)
RDW: 13.8 % (ref 11.5–15.5)
WBC: 9.6 10*3/uL (ref 4.0–10.5)

## 2015-08-31 LAB — HCG, QUANTITATIVE, PREGNANCY: HCG, BETA CHAIN, QUANT, S: 123 m[IU]/mL — AB (ref <5)

## 2015-08-31 MED ORDER — KETOROLAC TROMETHAMINE 60 MG/2ML IM SOLN
60.0000 mg | Freq: Once | INTRAMUSCULAR | Status: AC
  Administered 2015-08-31: 60 mg via INTRAMUSCULAR
  Filled 2015-08-31: qty 2

## 2015-08-31 MED ORDER — TRAMADOL HCL 50 MG PO TABS: 50.0000 mg | ORAL_TABLET | Freq: Four times a day (QID) | ORAL | Status: DC | PRN

## 2015-08-31 NOTE — MAU Provider Note (Signed)
Chief Complaint: Abdominal Pain and Vaginal Bleeding   First Provider Initiated Contact with Patient 08/31/15 1916      SUBJECTIVE  HPI  [name] is a 34 y.o. [id number] at 74w2dby LMP who presents to maternity admissions reporting vaginal bleeding and cramping.  Non viable fetal status was confirmed on 08/21/15.  She was given Rx for Cytotec and states she inserted it on Sunday.  Had cramping and bleeding that day, then is subsided on Monday. It got heavy again Tuesday and then got better. States bleeding got heavier today.   She denies vaginal itching/burning, urinary symptoms, h/a, dizziness, n/v, or fever/chills.     Past Medical History  Diagnosis Date  . Headache     "monthly" (08/10/2014)  . Diabetes mellitus without complication   . H/O preterm delivery, currently pregnant    Past Surgical History  Procedure Laterality Date  . No past surgeries     Social History   Social History  . Marital Status: Married    Spouse Name: N/A  . Number of Children: N/A  . Years of Education: N/A   Occupational History  . Not on file.   Social History Main Topics  . Smoking status: Never Smoker   . Smokeless tobacco: Never Used  . Alcohol Use: No  . Drug Use: No  . Sexual Activity: Yes   Other Topics Concern  . Not on file   Social History Narrative   No current facility-administered medications on file prior to encounter.   Current Outpatient Prescriptions on File Prior to Encounter  Medication Sig Dispense Refill  . Blood Glucose Monitoring Suppl (TRUE METRIX AIR GLUCOSE METER) W/DEVICE KIT 1 Device by Does not apply route 4 (four) times daily -  before meals and at bedtime. 1 kit 0  . glucose blood (TRUE METRIX BLOOD GLUCOSE TEST) test strip Use as instructed 100 each 12  . glucose blood test strip Use as instructed 100 each 0  . glucose monitoring kit (FREESTYLE) monitoring kit 1 each by Does not apply route as needed for other. 1 each 0  . ibuprofen  (ADVIL,MOTRIN) 600 MG tablet Take 1 tablet (600 mg total) by mouth every 6 (six) hours as needed. 20 tablet 0  . insulin NPH-regular Human (NOVOLIN 70/30) (70-30) 100 UNIT/ML injection Inject 15 Units into the skin 2 (two) times daily with a meal. (Patient taking differently: Inject 15 Units into the skin 2 (two) times daily with a meal. Pt states she only uses it when needed) 3 vial 3  . Insulin Syringe-Needle U-100 30G X 1/2" 0.5 ML MISC Use as directed 10 each 0  . Lancets (FREESTYLE) lancets Use as instructed 100 each 12  . metFORMIN (GLUCOPHAGE) 1000 MG tablet Take 1 tablet (1,000 mg total) by mouth 2 (two) times daily with a meal. 60 tablet 3  . misoprostol (CYTOTEC) 200 MCG tablet Place 4 tablets (800 mcg total) vaginally every 12 (twelve) hours. 8 tablet 0  . Prenatal Vit-Fe Fumarate-FA (PRENATAL MULTIVITAMIN) TABS tablet Take 1 tablet by mouth daily at 12 noon.     No Known Allergies  Medical, Surgical, Family and Social histories reviewed and are listed above.  Medications and allergies reviewed.   ROS:  Review of Systems  Gastrointestinal: Negative for nausea, vomiting, abdominal pain, diarrhea and constipation.  Genitourinary: Positive for vaginal bleeding and pelvic pain (cramping). Negative for dysuria and vaginal pain.  Musculoskeletal: Negative for back pain.  Neurological: Negative for dizziness.  other systems  negative   Physical Exam  Physical Exam   Constitutional: Well-developed, well-nourished female in no acute distress.  Cardiovascular: normal rate Respiratory: normal effort GI: Abd soft, non-tender. Pos BS x 4 MS: Extremities nontender, no edema, normal ROM Neurologic: Alert and oriented x 4.  GU: Neg CVAT.  PELVIC EXAM: Cervix pink, visually closed, without lesion, small to moderate blood in vault Bimanual exam: Cervix 0/long/high, firm, anterior, neg CMT, uterus nontender, adnexa without tenderness, enlargement, or mass   LAB RESULTS Results for orders  placed or performed during the hospital encounter of 08/31/15 (from the past 24 hour(s))  CBC     Status: Abnormal   Collection Time: 08/31/15  5:40 PM  Result Value Ref Range   WBC 9.6 4.0 - 10.5 K/uL   RBC 4.05 3.87 - 5.11 MIL/uL   Hemoglobin 11.6 (L) 12.0 - 15.0 g/dL   HCT 34.8 (L) 36.0 - 46.0 %   MCV 85.9 78.0 - 100.0 fL   MCH 28.6 26.0 - 34.0 pg   MCHC 33.3 30.0 - 36.0 g/dL   RDW 13.8 11.5 - 15.5 %   Platelets 279 150 - 400 K/uL  hCG, quantitative, pregnancy     Status: Abnormal   Collection Time: 08/31/15  5:40 PM  Result Value Ref Range   hCG, Beta Chain, Quant, S 123 (H) <5 mIU/mL   Results for KAJUANA, SHAREEF (MRN [id number]) as of 08/31/2015 21:20  Ref. Range 08/05/2015 10:46  HCG, Beta Chain, Quant, S Latest Ref Range: <5 mIU/mL 10166 (H)    --/--/O POS (08/19 1044)  IMAGING US Ob Comp Less 14 Wks  08/03/2015   CLINICAL DATA:  Pregnant, bleeding  EXAM: OBSTETRIC <14 WK [place] AND TRANSVAGINAL OB US  TECHNIQUE: Both transabdominal and transvaginal ultrasound examinations were performed for complete evaluation of the gestation as well as the maternal uterus, adnexal regions, and pelvic cul-de-sac. Transvaginal technique was performed to assess early pregnancy.  COMPARISON:  None.  FINDINGS: Intrauterine gestational sac: Visualized/normal in shape.  Yolk sac:  Not visualized  Embryo:  Not visualized  MSD: 8.3  mm   5 w   3  d  Maternal uterus/adnexae: Small subchronic hemorrhage.  Right ovary is within normal limits.  Left ovary is poorly visualized but grossly unremarkable.  No free fluid.  IMPRESSION: Single intrauterine gestation, measuring 5 weeks 3 days by mean sac diameter.  No yolk sac or fetal pole is visualized.  Consider follow-up pelvic ultrasound in 14 days to confirm viability as clinically warranted.   Electronically Signed   By: Julian Hy M.D.   On: 08/03/2015 12:17   US Ob Transvaginal  08/15/2015   CLINICAL DATA:  Scan for viability. Left lower quadrant  pain. LMP 06/20/2015. EDC by LMP is 03/26/2016. Patient is 8 weeks 0 days by LMP.  EXAM: TRANSVAGINAL OB ULTRASOUND  TECHNIQUE: Transvaginal ultrasound was performed for complete evaluation of the gestation as well as the maternal uterus, adnexal regions, and pelvic cul-de-sac.  COMPARISON:  08/07/2015  FINDINGS: Intrauterine gestational sac: Present  Yolk sac:  Present  Embryo: There has been development of a structure within the gestational sac separate from the yolk sac, possibly representing a small amnion or early embryo.  Cardiac Activity: Not seen  MSD: 14.5  mm   6 w   2  d  Maternal uterus/adnexae: The ovaries have a normal appearance.  IMPRESSION: 1. Gestational sac and yolk sac are definitely present. Possible early amnion or embryo. 2. Continued follow-up is  recommended. Next ultrasound is suggested in 10-14 days.   Electronically Signed   By: Nolon Nations M.D.   On: 08/15/2015 13:47   US Ob Transvaginal  08/07/2015   CLINICAL DATA:  Heavy vaginal bleeding. Estimated gestational age by LMP is 6 weeks 6 days. Quantitative beta HCG on 08/05/2015 was 10,166. No repeat levels available.  EXAM: TRANSVAGINAL OB ULTRASOUND  TECHNIQUE: Transvaginal ultrasound was performed for complete evaluation of the gestation as well as the maternal uterus, adnexal regions, and pelvic cul-de-sac.  COMPARISON:  08/05/2015  FINDINGS: Intrauterine gestational sac: A single intrauterine gestational sac is visualized.  Yolk sac: Appears to be present. Poorly visualized on images available.  Embryo:  Not identified.  Cardiac Activity: Not identified.  MSD: 13.3  mm   6 w   1  d  Maternal uterus/adnexae: Uterus is retroverted. Moderate-sized subchorionic hemorrhage is present. No significant change since prior study. The right ovary is visualized and appears normal. Left ovary is not identified. No abnormal adnexal masses. No free pelvic fluid.  IMPRESSION: Single intrauterine gestational sac again demonstrated with probable  yolk sac. Fetal pole is not identified. Size of yolk sac is consistent with appropriate interval growth since the previous studies. Moderate size subchorionic hemorrhage again demonstrated.   Electronically Signed   By: Lucienne Capers M.D.   On: 08/07/2015 22:24   US Ob Transvaginal  08/05/2015   CLINICAL DATA:  Vaginal bleeding. Inappropriate rise in quantitative beta HCG levels.  EXAM: TRANSVAGINAL OB ULTRASOUND  TECHNIQUE: Transvaginal ultrasound was performed for complete evaluation of the gestation as well as the maternal uterus, adnexal regions, and pelvic cul-de-sac.  COMPARISON:  08/03/2015  FINDINGS: Intrauterine gestational sac: Visualized/normal in shape.  Yolk sac:  Visualized  Embryo:  Not visualized  MSD: 11  mm   5 w   6  d  Maternal uterus/adnexae: Retroverted uterus noted. Small to moderate subchorionic hemorrhage again noted. Both ovaries are normal in appearance. No adnexal mass or free fluid identified.  IMPRESSION: Single intrauterine gestational sac measuring 5 weeks 6 days shows appropriate interval growth, and now demonstrates a yolk sac.  Small to moderate subchorionic hemorrhage again noted.   Electronically Signed   By: Earle Gell M.D.   On: 08/05/2015 13:37   US Ob Transvaginal  08/03/2015   CLINICAL DATA:  Pregnant, bleeding  EXAM: OBSTETRIC <14 WK [place] AND TRANSVAGINAL OB US  TECHNIQUE: Both transabdominal and transvaginal ultrasound examinations were performed for complete evaluation of the gestation as well as the maternal uterus, adnexal regions, and pelvic cul-de-sac. Transvaginal technique was performed to assess early pregnancy.  COMPARISON:  None.  FINDINGS: Intrauterine gestational sac: Visualized/normal in shape.  Yolk sac:  Not visualized  Embryo:  Not visualized  MSD: 8.3  mm   5 w   3  d  Maternal uterus/adnexae: Small subchronic hemorrhage.  Right ovary is within normal limits.  Left ovary is poorly visualized but grossly unremarkable.  No free fluid.  IMPRESSION:  Single intrauterine gestation, measuring 5 weeks 3 days by mean sac diameter.  No yolk sac or fetal pole is visualized.  Consider follow-up pelvic ultrasound in 14 days to confirm viability as clinically warranted.   Electronically Signed   By: Julian Hy M.D.   On: 08/03/2015 12:17    MAU Management/MDM: Ordered labs to measure Quant HCG.   [place] ordered to evaluate completeness of miscarriage. Reviewed results with patient and her family.  Explained that the miscarriage is complete.  Pt stable at time of discharge.  Will give one dose of Toradol 72m IM now for cramping.  ASSESSMENT SIUP at 163w2dy LMP Missed abortion Now completed abortion following administration of cytotec. Acceptable hemoglobin level, no severe anemia Uterine cramping with diminishing bleeding.   PLAN Discharge home Bleeding precautions Rx tramadol given for cramping May also continue use of ibuprofen    Medication List    ASK your doctor about these medications        freestyle lancets  Use as instructed     glucose blood test strip  Use as instructed     glucose blood test strip  Commonly known as:  TRUE METRIX BLOOD GLUCOSE TEST  Use as instructed     glucose monitoring kit monitoring kit  1 each by Does not apply route as needed for other.     TRUE METRIX AIR GLUCOSE METER W/DEVICE Kit  1 Device by Does not apply route 4 (four) times daily -  before meals and at bedtime.     ibuprofen 600 MG tablet  Commonly known as:  ADVIL,MOTRIN  Take 1 tablet (600 mg total) by mouth every 6 (six) hours as needed.     insulin NPH-regular Human (70-30) 100 UNIT/ML injection  Commonly known as:  NOVOLIN 70/30  Inject 15 Units into the skin 2 (two) times daily with a meal.     Insulin Syringe-Needle U-100 30G X 1/2" 0.5 ML Misc  Use as directed     metFORMIN 1000 MG tablet  Commonly known as:  GLUCOPHAGE  Take 1 tablet (1,000 mg total) by mouth 2 (two) times daily with a meal.      misoprostol 200 MCG tablet  Commonly known as:  CYTOTEC  Place 4 tablets (800 mcg total) vaginally every 12 (twelve) hours.     prenatal multivitamin Tabs tablet  Take 1 tablet by mouth daily at 12 noon.      Misoprostol discontinued   MaHansel FeinsteinNM, MSN Certified Nurse-Midwife 08/31/2015  7:26 PM

## 2015-08-31 NOTE — Discharge Instructions (Signed)
Aborto espontáneo  °(Miscarriage) °El aborto espontáneo es la pérdida de un bebé que no ha nacido (feto) antes de la semana 20 del embarazo. La mayor parte de estos abortos ocurre en los primeros 3 meses. En algunos casos ocurre antes de que la mujer sepa que está embarazada. También se denomina "aborto espontáneo" o "pérdida prematura del embarazo". El aborto espontáneo puede ser una experiencia que afecte emocionalmente a la persona. Converse con su médico si tiene dudas, cómo es el proceso de duelo, y sobre planes futuros de embarazo.  °CAUSAS  °· Algunos problemas cromosómicos pueden hacer imposible que el bebé se desarrolle normalmente. Los problemas con los genes o cromosomas del bebé son generalmente el resultado de errores que se producen, por casualidad, cuando el embrión se divide y crece. Estos problemas no se heredan de los padres. °· Infección en el cuello del útero.   °· Problemas hormonales.   °· Problemas en el cuello del útero, como tener un útero incompetente. Esto ocurre cuando los tejidos no son lo suficientemente fuertes como para contener el embarazo.   °· Problemas del útero, como un útero con forma anormal, los fibromas o anormalidades congénitas.   °· Ciertas enfermedades crónicas.   °· No fume, no beba alcohol, ni consuma drogas.   °· Traumatismos   °A veces, la causa es desconocida.  °SÍNTOMAS  °· Sangrado o manchado vaginal, con o sin cólicos o dolor. °· Dolor o cólicos en el abdomen o en la cintura. °· Eliminación de líquido, tejidos o coágulos grandes por la vagina. °DIAGNÓSTICO  °El médico le hará un examen físico. También le indicará una ecografía para confirmar el aborto. Es posible que se realicen análisis de sangre.  °TRATAMIENTO  °· En algunos casos el tratamiento no es necesario, si se eliminan naturalmente todos los tejidos embrionarios que se encontraban en el útero. Si el feto o la placenta quedan dentro del útero (aborto incompleto), pueden infectarse, los tejidos que quedan  pueden infectarse y deben retirarse. Generalmente se realiza un procedimiento de dilatación y curetaje (D y C). Durante el procedimiento de dilatación y curetaje, el cuello del útero se abre (dilata) y se retira cualquier resto de tejido fetal o placentario del útero. °· Si hay una infección, le recetarán antibióticos. Podrán recetarle otros medicamentos para reducir el tamaño del útero (contraerlo) si hay una mucho sangrado. °· Si su sangre es Rh negativa y su bebé es Rh positivo, usted necesitará la inyección de inmunoglobulina Rh. Esta inyección protegerá a los futuros bebés de tener problemas de compatibilidad Rh en futuros embarazos. °INSTRUCCIONES PARA EL CUIDADO EN EL HOGAR  °· El médico le indicará reposo en cama o le permitirá realizar actividades livianas. Vuelva a la actividad lentamente o según las indicaciones de su médico. °· Pídale a alguien que la ayude con las responsabilidades familiares y del hogar durante este tiempo.   °· Lleve un registro de la cantidad y la saturación de las toallas higiénicas que utiliza cada día. Anote esta información   °· No use tampones. No No se haga duchas vaginales ni tenga relaciones sexuales hasta que el médico la autorice.   °· Sólo tome medicamentos de venta libre o recetados para calmar el dolor o el malestar, según las indicaciones de su médico.   °· No tome aspirina. La aspirina puede ocasionar hemorragias.   °· Concurra puntualmente a las citas de control con el médico.   °· Si usted o su pareja tienen dificultades con el duelo, hable con su médico para buscar la ayuda psicológica que los ayude a enfrentar la pérdida   del embarazo. Permítase el tiempo suficiente de duelo antes de quedar embarazada nuevamente.   °SOLICITE ATENCIÓN MÉDICA DE INMEDIATO SI:  °· Siente calambres intensos o dolor en la espalda o en el abdomen. °· Tiene fiebre. °· Elimina grandes coágulos de sangre (del tamaño de una nuez o más) o tejidos por la vagina. Guarde lo que ha eliminado para  que su médico lo examine.   °· La hemorragia aumenta.   °· Observa una secreción vaginal espesa y con mal olor. °· Se siente mareada, débil, o se desmaya.   °· Siente escalofríos.   °ASEGÚRESE DE QUE:  °· Comprende estas instrucciones. °· Controlará su enfermedad. °· Solicitará ayuda de inmediato si no mejora o si empeora. °Document Released: 09/10/2005 Document Revised: 03/28/2013 °ExitCare® Patient Information ©2015 [employer]. This information is not intended to replace advice given to you by your health care provider. Make sure you discuss any questions you have with your health care provider. ° °

## 2015-09-03 ENCOUNTER — Encounter: Payer: Medicaid Other | Admitting: Obstetrics & Gynecology

## 2015-09-24 ENCOUNTER — Ambulatory Visit: Payer: Self-pay | Admitting: Obstetrics & Gynecology

## 2015-09-29 ENCOUNTER — Encounter (HOSPITAL_COMMUNITY): Payer: Self-pay

## 2015-09-29 ENCOUNTER — Emergency Department (HOSPITAL_COMMUNITY)
Admission: EM | Admit: 2015-09-29 | Discharge: 2015-09-29 | Disposition: A | Discharge status: Home / Self Care | Payer: Self-pay | Attending: Emergency Medicine | Admitting: Emergency Medicine

## 2015-09-29 DIAGNOSIS — Z794 Long term (current) use of insulin: Secondary | ICD-10-CM | POA: Insufficient documentation

## 2015-09-29 DIAGNOSIS — Z79899 Other long term (current) drug therapy: Secondary | ICD-10-CM | POA: Insufficient documentation

## 2015-09-29 DIAGNOSIS — G5603 Carpal tunnel syndrome, bilateral upper limbs: Secondary | ICD-10-CM | POA: Insufficient documentation

## 2015-09-29 DIAGNOSIS — E119 Type 2 diabetes mellitus without complications: Secondary | ICD-10-CM | POA: Insufficient documentation

## 2015-09-29 MED ORDER — HYDROCODONE-ACETAMINOPHEN 5-325 MG PO TABS: 1.0000 | ORAL_TABLET | Freq: Four times a day (QID) | ORAL | Status: DC | PRN

## 2015-09-29 MED ORDER — IBUPROFEN 600 MG PO TABS: 600.0000 mg | ORAL_TABLET | Freq: Four times a day (QID) | ORAL | Status: DC | PRN

## 2015-09-29 NOTE — ED Notes (Signed)
Bilateral hand pain. Pt. Denies any injuries.  Was seen for the hand pain and placed on Neurontin,  It has not given her any relief.  No swelling or deformity noted. +radial pulse, both hands are cool to touch. No discoloration noted to skin.  +cap. Refill , decreased ROM due to pain

## 2015-09-29 NOTE — Discharge Instructions (Signed)
Sndrome del tnel carpiano (Carpal Tunnel Syndrome) El sndrome del tnel carpiano es una afeccin que causa dolor en la mano y en el brazo. El tnel carpiano es un espacio estrecho ubicado en el lado palmar de la [place]. Los movimientos de la [place] o ciertas enfermedades pueden causar hinchazn del tnel. Esta hinchazn comprime el nervio principal de la mueca (nervio mediano). CAUSAS  Esta afeccin puede ser causada por lo siguiente:   Movimientos repetidos de [employer].  Lesiones en la [place].  Artritis.  Un quiste o un tumor en el tnel carpiano.  Acumulacin de lquido Solicitor. A veces, se desconoce la causa de esta afeccin.  FACTORES DE RIESGO Es ms probable que esta afeccin se manifieste en:   Las personas que tienen trabajos en los que deben [employer] mismos movimientos repetidos de las [place], como los carniceros y los cajeros.  Las mujeres.  Las personas que tienen determinadas enfermedades, por ejemplo:  Diabetes.  Obesidad.  Tiroides hipoactiva (hipotiroidismo).  Insuficiencia renal. SNTOMAS  Los sntomas de esta afeccin incluyen lo siguiente:   Sensacin de hormigueo en los dedos de la mano, Production assistant, radio, el ndice y el dedo [place].  Hormigueo o adormecimiento en la mano.  Sensacin de Social research officer, government en todo el brazo, especialmente cuando la [place] y el codo estn flexionados durante [place].  Dolor en la mueca que sube por el brazo hasta el hombro.  Dolor que baja por la mano o los dedos.  Sensacin de [employer]. Tal vez tenga dificultad para tomar y Licensed conveyancer. Los sntomas pueden empeorar durante la noche.  DIAGNSTICO  Esta afeccin se diagnostica mediante la historia clnica y un examen fsico. Tambin pueden hacerle exmenes, que incluyen los siguientes:   Electromiografa (EMG). Esta prueba mide las seales elctricas que los nervios les envan a los msculos.  Radiografas. TRATAMIENTO  El  tratamiento de esta afeccin incluye lo siguiente:  Cambios en el estilo de vida. Es importante dejar de Field seismologist o modificar la actividad que caus la afeccin.  Fisioterapia o terapia ocupacional.  Analgsicos y antiinflamatorios. Esto puede incluir medicamentos que se inyectan en la [place].  Una frula para la [place].  [place]. INSTRUCCIONES PARA EL CUIDADO EN EL HOGAR  Si tiene una frula:  sela como se lo haya indicado el mdico. Qutesela solamente como se lo haya indicado el mdico.  Afloje la frula si los dedos se le entumecen, siente hormigueos o se le enfran y se tornan de Optician, dispensing.  Mantenga la frula limpia y seca. Instrucciones generales  [employer] de venta libre y los recetados solamente como se lo haya indicado el mdico.  Haga reposar la [place] de toda actividad que le cause dolor. Si la afeccin tiene relacin con Leander Rams, hable con su empleador [employer] pueden [place], por [place], usar una almohadilla para apoyar la mueca mientras tipea.  Si se lo indican, aplique hielo sobre la zona dolorida:  Ponga el hielo en una bolsa plstica.  Coloque una toalla entre la piel y la bolsa de hielo.  Coloque el hielo durante 34minutos, 2 a 3veces por Training and development officer.  Concurra a todas las visitas de control como se lo haya indicado el mdico. Esto es importante.  Haga los ejercicios como se lo hayan indicado el mdico, el fisioterapeuta o el terapeuta ocupacional. SOLICITE ATENCIN MDICA SI:   Aparecen nuevos sntomas.  El dolor no se alivia con los [employer].  Los sntomas empeoran.  Esta informacin no tiene Marine scientist el consejo del mdico. Asegrese de hacerle al mdico cualquier pregunta que tenga.   Document Released: 12/01/2005 Document Revised: 08/22/2015 Elsevier Interactive Patient Education [employer].

## 2015-09-29 NOTE — ED Provider Notes (Signed)
CSN: [id number]     Arrival date & time 09/29/15  1335 History   First MD Initiated Contact with Patient 09/29/15 1354     Chief Complaint  Patient presents with  . Hand Pain     (Consider location/radiation/quality/duration/timing/severity/associated sxs/prior Treatment) HPI Comments: Patient presents to the emergency department with chief complaint of lateral hand and wrist pain. She states that she has been having the symptoms for the past 2-3 weeks. She denies any mechanism of injury. She states that she does a lot of housework, cooking, cleaning, and lifting her children. She states that she is tried taking Neurontin with no relief. She denies any fevers, chills, nausea, or vomiting. The symptoms are aggravated with movement.  The history is provided by the patient. The history is limited by a language barrier. A language interpreter was used.    Past Medical History  Diagnosis Date  . Headache     "monthly" (08/10/2014)  . Diabetes mellitus without complication ([place])   . H/O preterm delivery, currently pregnant    Past Surgical History  Procedure Laterality Date  . No past surgeries     No family history on file. Social History  Substance Use Topics  . Smoking status: Never Smoker   . Smokeless tobacco: Never Used  . Alcohol Use: No   OB History    Gravida Para Term Preterm AB TAB SAB Ectopic Multiple Living   5 4 2 2  0     3     Review of Systems  Constitutional: Negative for fever and chills.  Respiratory: Negative for shortness of breath.   Cardiovascular: Negative for chest pain.  Gastrointestinal: Negative for nausea, vomiting, diarrhea and constipation.  Genitourinary: Negative for dysuria.  Musculoskeletal: Positive for arthralgias.      Allergies  Review of patient's allergies indicates no known allergies.  Home Medications   Prior to Admission medications   Medication Sig Start Date End Date Taking? Authorizing Provider  Blood Glucose Monitoring  Suppl (TRUE METRIX AIR GLUCOSE METER) W/DEVICE KIT 1 Device by Does not apply route 4 (four) times daily -  before meals and at bedtime. 08/14/15   Lance Bosch, NP  glucose blood (TRUE METRIX BLOOD GLUCOSE TEST) test strip Use as instructed 08/02/15   Lance Bosch, NP  glucose blood test strip Use as instructed 09/29/14   Lance Bosch, NP  glucose monitoring kit (FREESTYLE) monitoring kit 1 each by Does not apply route as needed for other. 08/14/14   Sahar Heloise Beecham, PA-C  ibuprofen (ADVIL,MOTRIN) 600 MG tablet Take 1 tablet (600 mg total) by mouth every 6 (six) hours as needed. 08/15/15   Lavonia Drafts, MD  insulin NPH-regular Human (NOVOLIN 70/30) (70-30) 100 UNIT/ML injection Inject 15 Units into the skin 2 (two) times daily with a meal. Patient taking differently: Inject 15 Units into the skin 2 (two) times daily with a meal. Pt states she only uses it when needed 08/25/14   Tresa Garter, MD  Insulin Syringe-Needle U-100 30G X 1/2" 0.5 ML MISC Use as directed 08/14/14   Waldon Merl, PA-C  Lancets (FREESTYLE) lancets Use as instructed 09/29/14   Lance Bosch, NP  metFORMIN (GLUCOPHAGE) 1000 MG tablet Take 1 tablet (1,000 mg total) by mouth 2 (two) times daily with a meal. 08/02/15   Lance Bosch, NP  Prenatal Vit-Fe Fumarate-FA (PRENATAL MULTIVITAMIN) TABS tablet Take 1 tablet by mouth daily at 12 noon.    Historical Provider, MD  traMADol (ULTRAM) 50 MG tablet Take 1 tablet (50 mg total) by mouth every 6 (six) hours as needed. 08/31/15   Seabron Spates, CNM   BP 133/81 mmHg  Pulse 87  Temp(Src) 98.6 F (37 C) (Oral)  Resp 16  Ht 5' 1"  (1.549 m)  Wt 140 lb (63.504 kg)  BMI 26.47 kg/m2  SpO2 100%  LMP 09/23/2015  Breastfeeding? No Physical Exam  Constitutional: She is oriented to person, place, and time. She appears well-developed and well-nourished.  HENT:  Head: Normocephalic and atraumatic.  Eyes: Conjunctivae and EOM are normal.  Neck: Normal range of motion.   Cardiovascular: Normal rate and intact distal pulses.   Intact distal pulses with brisk capillary refill  Pulmonary/Chest: Effort normal.  Abdominal: She exhibits no distension.  Musculoskeletal: Normal range of motion.  Bilateral wrists nontender to palpation, range of motion and strength 5/5, positive Tinel sign, positive Phalen sign  Neurological: She is alert and oriented to person, place, and time.  Sensation intact  Skin: Skin is dry.  Psychiatric: She has a normal mood and affect. Her behavior is normal. Judgment and thought content normal.  Nursing note and vitals reviewed.   ED Course  Procedures (including critical care time) Labs Review Labs Reviewed - No data to display  Imaging Review No results found. I have personally reviewed and evaluated these images and lab results as part of my medical decision-making.   EKG Interpretation None      MDM   Final diagnoses:  Bilateral carpal tunnel syndrome    Patient with suspected carpal tunnel syndrome. Her symptoms are exacerbated with Tinel and Phalen tests. She does not have any mechanism of injury. No indication for imaging. The symptoms are bilateral, making septic arthritis unlikely. There is no fever, erythema, or evidence of traumatic injury. I will place the patient in bilateral wrist cock up splints, and recommend that she take NSAIDs and follow-up with hand surgery for evaluation of carpal tunnel. Patient understands agrees with the plan. She is stable and ready for discharge.    Montine Circle, PA-C 09/29/15 1422  Milton Ferguson, MD 09/30/15 [phone]

## 2015-10-04 ENCOUNTER — Ambulatory Visit: Payer: Self-pay | Attending: Internal Medicine

## 2015-10-15 ENCOUNTER — Ambulatory Visit: Payer: Self-pay | Admitting: Internal Medicine

## 2015-10-17 ENCOUNTER — Encounter: Payer: Self-pay | Admitting: Internal Medicine

## 2015-10-17 ENCOUNTER — Ambulatory Visit: Payer: Self-pay | Attending: Internal Medicine | Admitting: Internal Medicine

## 2015-10-17 VITALS — BP 126/76 | HR 82 | Temp 98.2°F | Resp 16 | Ht 62.0 in | Wt 155.0 lb

## 2015-10-17 DIAGNOSIS — G5603 Carpal tunnel syndrome, bilateral upper limbs: Secondary | ICD-10-CM | POA: Insufficient documentation

## 2015-10-17 DIAGNOSIS — Z2821 Immunization not carried out because of patient refusal: Secondary | ICD-10-CM | POA: Insufficient documentation

## 2015-10-17 DIAGNOSIS — Z794 Long term (current) use of insulin: Secondary | ICD-10-CM | POA: Insufficient documentation

## 2015-10-17 DIAGNOSIS — E119 Type 2 diabetes mellitus without complications: Secondary | ICD-10-CM | POA: Insufficient documentation

## 2015-10-17 LAB — POCT GLYCOSYLATED HEMOGLOBIN (HGB A1C): Hemoglobin A1C: 6

## 2015-10-17 LAB — GLUCOSE, POCT (MANUAL RESULT ENTRY): POC Glucose: 134 mg/dl — AB (ref 70–99)

## 2015-10-17 MED ORDER — INSULIN NPH ISOPHANE & REGULAR (70-30) 100 UNIT/ML ~~LOC~~ SUSP: 15.0000 [IU] | Freq: Two times a day (BID) | SUBCUTANEOUS | Status: DC

## 2015-10-17 MED ORDER — METFORMIN HCL 1000 MG PO TABS: 1000.0000 mg | ORAL_TABLET | Freq: Two times a day (BID) | ORAL | Status: DC

## 2015-10-17 MED ORDER — IBUPROFEN 600 MG PO TABS: 600.0000 mg | ORAL_TABLET | Freq: Four times a day (QID) | ORAL | Status: DC | PRN

## 2015-10-17 MED ORDER — TRAMADOL HCL 50 MG PO TABS: 50.0000 mg | ORAL_TABLET | Freq: Four times a day (QID) | ORAL | Status: DC | PRN

## 2015-10-17 NOTE — Progress Notes (Signed)
Patient ID: [name], female   DOB: 06-07-1981, 34 y.o.   MRN: [id number]  CC: ED follow up   HPI: [name] is a 34 y.o. female here today for a follow up visit.  Patient has past medical history of diabetes and recent spontaneous abortion. Patient was recently seen in the ER with complaints of bilateral hand pain that was thought to be Carpal tunnel. Patient was told to follow up with her PCP for referral to Orthopedics. Today she continues to report lateral hand and wrist pain. She states that she has been having the symptoms for the past 3-4 weeks. She denies any mechanism of injury. She states that she does a lot of housework, cooking, cleaning, and lifting her children. She states that she is tried taking Neurontin with no relief. The symptoms are aggravated with movement.  No Known Allergies Past Medical History  Diagnosis Date  . Headache     "monthly" (08/10/2014)  . Diabetes mellitus without complication ([place])   . H/O preterm delivery, currently pregnant    Current Outpatient Prescriptions on File Prior to Visit  Medication Sig Dispense Refill  . HYDROcodone-acetaminophen (NORCO/VICODIN) 5-325 MG tablet Take 1-2 tablets by mouth every 6 (six) hours as needed. 10 tablet 0  . ibuprofen (ADVIL,MOTRIN) 600 MG tablet Take 1 tablet (600 mg total) by mouth every 6 (six) hours as needed. 30 tablet 0  . insulin NPH-regular Human (NOVOLIN 70/30) (70-30) 100 UNIT/ML injection Inject 15 Units into the skin 2 (two) times daily with a meal. (Patient taking differently: Inject 15 Units into the skin 2 (two) times daily with a meal. Pt states she only uses it when needed) 3 vial 3  . Blood Glucose Monitoring Suppl (TRUE METRIX AIR GLUCOSE METER) W/DEVICE KIT 1 Device by Does not apply route 4 (four) times daily -  before meals and at bedtime. 1 kit 0  . glucose blood (TRUE METRIX BLOOD GLUCOSE TEST) test strip Use as instructed 100 each 12  . glucose blood test strip Use as instructed 100  each 0  . glucose monitoring kit (FREESTYLE) monitoring kit 1 each by Does not apply route as needed for other. 1 each 0  . Insulin Syringe-Needle U-100 30G X 1/2" 0.5 ML MISC Use as directed 10 each 0  . Lancets (FREESTYLE) lancets Use as instructed 100 each 12  . metFORMIN (GLUCOPHAGE) 1000 MG tablet Take 1 tablet (1,000 mg total) by mouth 2 (two) times daily with a meal. 60 tablet 3  . Prenatal Vit-Fe Fumarate-FA (PRENATAL MULTIVITAMIN) TABS tablet Take 1 tablet by mouth daily at 12 noon.    . traMADol (ULTRAM) 50 MG tablet Take 1 tablet (50 mg total) by mouth every 6 (six) hours as needed. 15 tablet 0   No current facility-administered medications on file prior to visit.   History reviewed. No pertinent family history. Social History   Social History  . Marital Status: Married    Spouse Name: N/A  . Number of Children: N/A  . Years of Education: N/A   Occupational History  . Not on file.   Social History Main Topics  . Smoking status: Never Smoker   . Smokeless tobacco: Never Used  . Alcohol Use: No  . Drug Use: No  . Sexual Activity: Yes   Other Topics Concern  . Not on file   Social History Narrative    Review of Systems: Other than what is stated in HPI, all other systems are negative.  Objective:   Filed Vitals:   10/17/15 1112  BP: 126/76  Pulse: 82  Temp: 98.2 F (36.8 C)  Resp: 16    Physical Exam  Constitutional: She is oriented to person, place, and time.  Cardiovascular: Normal rate, regular rhythm and normal heart sounds.   Pulmonary/Chest: Effort normal and breath sounds normal.  Musculoskeletal:  Positive Phalen's test  Neurological: She is alert and oriented to person, place, and time.     Lab Results  Component Value Date   WBC 9.6 08/31/2015   HGB 11.6* 08/31/2015   HCT 34.8* 08/31/2015   MCV 85.9 08/31/2015   PLT 279 08/31/2015   Lab Results  Component Value Date   CREATININE 0.45* 08/14/2014   BUN 9 08/14/2014   NA 142  08/14/2014   K 3.4* 08/14/2014   CL 105 08/14/2014   CO2 19 08/14/2014    Lab Results  Component Value Date   HGBA1C 6.0 10/17/2015   Lipid Panel     Component Value Date/Time   CHOL 185 11/24/2014 0937   TRIG 135 11/24/2014 0937   HDL 51 11/24/2014 0937   CHOLHDL 3.6 11/24/2014 0937   VLDL 27 11/24/2014 0937   LDLCALC 107* 11/24/2014 0937       Assessment and plan:   [name] was seen today for referral.  Diagnoses and all orders for this visit:  Bilateral carpal tunnel syndrome -     Ambulatory referral to Orthopedic Surgery -     ibuprofen (ADVIL,MOTRIN) 600 MG tablet; Take 1 tablet (600 mg total) by mouth every 6 (six) hours as needed. Given short dose of Tramadol Continue to wear splint given by ER.  Type 2 diabetes mellitus without complication, with long-term current use of insulin ([facility]) -     Glucose (CBG) -     HgB A1c -     insulin NPH-regular Human (NOVOLIN 70/30) (70-30) 100 UNIT/ML injection; Inject 15 Units into the skin 2 (two) times daily with a meal. -     metFORMIN (GLUCOPHAGE) 1000 MG tablet; Take 1 tablet (1,000 mg total) by mouth 2 (two) times daily with a meal. Patients diabetes is well control as evidence by consistently low a1c.  Patient will continue with current therapy and continue to make necessary lifestyle changes.  Reviewed foot care, diet, exercise, annual health maintenance with patient.   Refused influenza vaccine I have discussed benefits of vaccination.   Return in about 3 months (around 01/17/2016) for Diabetes Mellitus.        Lance Bosch, [facility] [phone] 10/17/2015, 11:19 AM'

## 2015-10-17 NOTE — Progress Notes (Signed)
Patient here for referral to ortho for her carpal tunnel to both hands Also in need of refill on her insulin

## 2015-11-05 ENCOUNTER — Ambulatory Visit (INDEPENDENT_AMBULATORY_CARE_PROVIDER_SITE_OTHER): Payer: Self-pay | Admitting: Sports Medicine

## 2015-11-05 ENCOUNTER — Encounter: Payer: Self-pay | Admitting: Sports Medicine

## 2015-11-05 VITALS — BP 106/53 | Ht 62.0 in | Wt 155.0 lb

## 2015-11-05 DIAGNOSIS — M25532 Pain in left wrist: Secondary | ICD-10-CM

## 2015-11-05 DIAGNOSIS — M25531 Pain in right wrist: Secondary | ICD-10-CM

## 2015-11-05 NOTE — Progress Notes (Signed)
   Subjective:    Patient ID: [name], female    DOB: 01/27/1981, 34 y.o.   MRN: [id number]  HPI chief complaint: Bilateral wrist pain  34 year old female comes in today complaining of bilateral forearm and wrist pain and numbness. Symptoms have been present now for about 6 years but have grown worse over the past year. She describes a burning type pain at the radial aspect of her forearm and hand. It is constantly present. She has tried several different medications as well as wrist splints. However her symptoms persist. In addition to pain she is also getting weakness. She has not had a nerve conduction study test done. She denies trauma. She does have a history of diabetes. Please note that the entire history is obtained with the help of an interpreter.  Past medical history reviewed Medications reviewed Allergies reviewed    Review of Systems    as above Objective:   Physical Exam Well-developed, well-nourished. No acute distress. Awake alert and oriented 3. Vital signs reviewed  Examination of both wrists show a positive Phalen's bilaterally. Positive Tinel's bilaterally. No obvious atrophy. She does have diffuse weakness particularly with grip. Full wrist range of motion. No soft tissue swelling. No joint effusion. Good radial and ulnar pulses.       Assessment & Plan:  Chronic bilateral wrist pain and numbness-rule out carpal tunnel syndrome versus diabetic neuropathy  EMG/nerve conduction study to rule out carpal tunnel syndrome versus possible diabetic neuropathy. Patient will follow-up with me after those studies to discuss the results and delineate more definitive treatment. In the meantime she will continue with her cockup wrist braces. I did discuss the possibility of using them only at night but she states that they do help with her symptoms during the day as well.

## 2015-11-05 NOTE — Patient Instructions (Signed)
[facility] Dr Laurence Spates Tuesday December 6th at 10:30am [place], [place], [place] [address] Phone: [phone]

## 2015-11-14 ENCOUNTER — Ambulatory Visit: Payer: Self-pay

## 2015-11-21 ENCOUNTER — Ambulatory Visit: Payer: Self-pay | Attending: Internal Medicine

## 2015-11-26 ENCOUNTER — Ambulatory Visit: Payer: Self-pay | Admitting: Sports Medicine

## 2016-01-14 ENCOUNTER — Ambulatory Visit (INDEPENDENT_AMBULATORY_CARE_PROVIDER_SITE_OTHER): Payer: Self-pay | Admitting: Sports Medicine

## 2016-01-14 ENCOUNTER — Encounter: Payer: Self-pay | Admitting: Sports Medicine

## 2016-01-14 VITALS — BP 118/74 | Ht 62.0 in | Wt 155.0 lb

## 2016-01-14 DIAGNOSIS — G5603 Carpal tunnel syndrome, bilateral upper limbs: Secondary | ICD-10-CM

## 2016-01-14 NOTE — Progress Notes (Signed)
Patient ID: [name], female   DOB: 10-22-1981, 35 y.o.   MRN: [id number]  Patient comes in today to discuss results of a recent EMG/nerve conduction study of both upper extremities to evaluate the degree of carpal tunnel syndrome present. Unfortunately, the results of that study are not yet available. The patient was told that she has rather severe carpal tunnel syndrome in one hand. I explained to her that if that is the case then she will likely need a surgical decompression. Her wrist braces have been helpful but not curative. I will await the results of that study and will contact the patient via telephone with a more definitive treatment plan once I have reviewed that test.  Total time spent with the patient was 10 minutes with greater than 50% of the time spent in face-to-face consultation discussing possible future treatment.

## 2016-01-16 DIAGNOSIS — G5603 Carpal tunnel syndrome, bilateral upper limbs: Secondary | ICD-10-CM

## 2016-01-16 HISTORY — DX: Carpal tunnel syndrome, bilateral upper limbs: G56.03

## 2016-01-18 ENCOUNTER — Telehealth: Payer: Self-pay | Admitting: Sports Medicine

## 2016-01-18 DIAGNOSIS — G5603 Carpal tunnel syndrome, bilateral upper limbs: Secondary | ICD-10-CM

## 2016-01-18 NOTE — Telephone Encounter (Signed)
[facility] Erlinda Hong Monday 01/28/16 at 1030am [address], [place], [place] [address] Phone: [phone]

## 2016-01-18 NOTE — Addendum Note (Signed)
Addended by: Cyd Silence on: 01/18/2016 10:48 AM   Modules accepted: Orders

## 2016-01-18 NOTE — Telephone Encounter (Signed)
I received this patient's EMG/nerve conduction study results from Dr. Ernestina Patches. She has moderate to severe bilateral carpal tunnel syndrome, right slightly worse than the left. Her symptoms have been present for several years despite conservative treatment. Therefore I would like to arrange for surgical consultation to discuss merits of a carpal tunnel release. We will set her up with Dr.Xu to discuss this further. Definitive treatment will be per Dr.Xu's discretion.

## 2016-01-21 ENCOUNTER — Encounter: Payer: Self-pay | Admitting: Sports Medicine

## 2016-01-31 ENCOUNTER — Encounter (HOSPITAL_BASED_OUTPATIENT_CLINIC_OR_DEPARTMENT_OTHER): Payer: Self-pay | Admitting: *Deleted

## 2016-01-31 ENCOUNTER — Other Ambulatory Visit: Payer: Self-pay | Admitting: Orthopaedic Surgery

## 2016-01-31 NOTE — Pre-Procedure Instructions (Signed)
To come for BMET, EKG and weight

## 2016-02-04 ENCOUNTER — Other Ambulatory Visit: Payer: Self-pay

## 2016-02-04 ENCOUNTER — Encounter (HOSPITAL_BASED_OUTPATIENT_CLINIC_OR_DEPARTMENT_OTHER)
Admission: RE | Admit: 2016-02-04 | Discharge: 2016-02-04 | Disposition: A | Discharge status: Home / Self Care | Payer: Self-pay | Source: Ambulatory Visit | Attending: Orthopaedic Surgery | Admitting: Orthopaedic Surgery

## 2016-02-04 DIAGNOSIS — I10 Essential (primary) hypertension: Secondary | ICD-10-CM | POA: Insufficient documentation

## 2016-02-04 DIAGNOSIS — Z01810 Encounter for preprocedural cardiovascular examination: Secondary | ICD-10-CM | POA: Insufficient documentation

## 2016-02-04 LAB — BASIC METABOLIC PANEL
Anion gap: 8 (ref 5–15)
BUN: 11 mg/dL (ref 6–20)
CO2: 21 mmol/L — ABNORMAL LOW (ref 22–32)
CREATININE: 0.56 mg/dL (ref 0.44–1.00)
Calcium: 8.6 mg/dL — ABNORMAL LOW (ref 8.9–10.3)
Chloride: 109 mmol/L (ref 101–111)
GFR calc Af Amer: >60 mL/min (ref >60)
GLUCOSE: 113 mg/dL — AB (ref 65–99)
POTASSIUM: 4.8 mmol/L (ref 3.5–5.1)
SODIUM: 138 mmol/L (ref 135–145)

## 2016-02-05 NOTE — Pre-Procedure Instructions (Signed)
Mariel will be interpreter for pt., per Manuela Schwartz at [facility] for [facility]; please call [phone] if surgery time changes.

## 2016-02-06 ENCOUNTER — Ambulatory Visit (HOSPITAL_BASED_OUTPATIENT_CLINIC_OR_DEPARTMENT_OTHER)
Admission: RE | Admit: 2016-02-06 | Discharge: 2016-02-06 | Disposition: A | Discharge status: Home / Self Care | Payer: Self-pay | Source: Ambulatory Visit | Attending: Orthopaedic Surgery | Admitting: Orthopaedic Surgery

## 2016-02-06 ENCOUNTER — Encounter (HOSPITAL_BASED_OUTPATIENT_CLINIC_OR_DEPARTMENT_OTHER)
Admission: RE | Disposition: A | Discharge status: Home / Self Care | Payer: Self-pay | Source: Ambulatory Visit | Attending: Orthopaedic Surgery

## 2016-02-06 ENCOUNTER — Ambulatory Visit (HOSPITAL_BASED_OUTPATIENT_CLINIC_OR_DEPARTMENT_OTHER): Payer: Self-pay | Admitting: Anesthesiology

## 2016-02-06 ENCOUNTER — Encounter (HOSPITAL_BASED_OUTPATIENT_CLINIC_OR_DEPARTMENT_OTHER): Payer: Self-pay | Admitting: *Deleted

## 2016-02-06 DIAGNOSIS — Z793 Long term (current) use of hormonal contraceptives: Secondary | ICD-10-CM | POA: Insufficient documentation

## 2016-02-06 DIAGNOSIS — Z7984 Long term (current) use of oral hypoglycemic drugs: Secondary | ICD-10-CM | POA: Insufficient documentation

## 2016-02-06 DIAGNOSIS — G5603 Carpal tunnel syndrome, bilateral upper limbs: Secondary | ICD-10-CM | POA: Insufficient documentation

## 2016-02-06 DIAGNOSIS — E119 Type 2 diabetes mellitus without complications: Secondary | ICD-10-CM | POA: Insufficient documentation

## 2016-02-06 HISTORY — DX: Carpal tunnel syndrome, bilateral upper limbs: G56.03

## 2016-02-06 HISTORY — DX: Type 2 diabetes mellitus without complications: E11.9

## 2016-02-06 HISTORY — PX: BILATERAL CARPAL TUNNEL RELEASE: SHX6508

## 2016-02-06 LAB — GLUCOSE, CAPILLARY
GLUCOSE-CAPILLARY: 110 mg/dL — AB (ref 65–99)
Glucose-Capillary: 128 mg/dL — ABNORMAL HIGH (ref 65–99)

## 2016-02-06 SURGERY — BILATERAL CARPAL TUNNEL RELEASE
Anesthesia: General | Site: Wrist | Laterality: Bilateral

## 2016-02-06 MED ORDER — SCOPOLAMINE 1 MG/3DAYS TD PT72: 1.0000 | MEDICATED_PATCH | Freq: Once | TRANSDERMAL | Status: DC | PRN

## 2016-02-06 MED ORDER — PROMETHAZINE HCL 25 MG/ML IJ SOLN
6.2500 mg | INTRAMUSCULAR | Status: DC | PRN
  Administered 2016-02-06: 6.25 mg via INTRAVENOUS

## 2016-02-06 MED ORDER — BUPIVACAINE HCL (PF) 0.25 % IJ SOLN
INTRAMUSCULAR | Status: DC | PRN
  Administered 2016-02-06: 6 mL

## 2016-02-06 MED ORDER — FENTANYL CITRATE (PF) 100 MCG/2ML IJ SOLN
INTRAMUSCULAR | Status: AC
  Filled 2016-02-06: qty 2

## 2016-02-06 MED ORDER — ONDANSETRON HCL 4 MG/2ML IJ SOLN: 4.0000 mg | Freq: Once | INTRAMUSCULAR | Status: DC | PRN

## 2016-02-06 MED ORDER — LACTATED RINGERS IV SOLN
INTRAVENOUS | Status: DC
  Administered 2016-02-06 (×3): via INTRAVENOUS

## 2016-02-06 MED ORDER — PROPOFOL 500 MG/50ML IV EMUL
INTRAVENOUS | Status: AC
  Filled 2016-02-06: qty 100

## 2016-02-06 MED ORDER — BUPIVACAINE HCL (PF) 0.25 % IJ SOLN
INTRAMUSCULAR | Status: AC
  Filled 2016-02-06: qty 60

## 2016-02-06 MED ORDER — FENTANYL CITRATE (PF) 100 MCG/2ML IJ SOLN
25.0000 ug | INTRAMUSCULAR | Status: DC | PRN
  Administered 2016-02-06 (×3): 50 ug via INTRAVENOUS

## 2016-02-06 MED ORDER — LIDOCAINE HCL (CARDIAC) 20 MG/ML IV SOLN
INTRAVENOUS | Status: AC
  Filled 2016-02-06: qty 5

## 2016-02-06 MED ORDER — LIDOCAINE HCL (CARDIAC) 20 MG/ML IV SOLN
INTRAVENOUS | Status: DC | PRN
  Administered 2016-02-06: 50 mg via INTRAVENOUS

## 2016-02-06 MED ORDER — DEXAMETHASONE SODIUM PHOSPHATE 10 MG/ML IJ SOLN
INTRAMUSCULAR | Status: AC
  Filled 2016-02-06: qty 1

## 2016-02-06 MED ORDER — CEFAZOLIN SODIUM-DEXTROSE 2-3 GM-% IV SOLR
INTRAVENOUS | Status: AC
  Filled 2016-02-06: qty 50

## 2016-02-06 MED ORDER — GLYCOPYRROLATE 0.2 MG/ML IJ SOLN: 0.2000 mg | Freq: Once | INTRAMUSCULAR | Status: DC | PRN

## 2016-02-06 MED ORDER — FENTANYL CITRATE (PF) 100 MCG/2ML IJ SOLN: 50.0000 ug | INTRAMUSCULAR | Status: DC | PRN

## 2016-02-06 MED ORDER — PROMETHAZINE HCL 25 MG/ML IJ SOLN
INTRAMUSCULAR | Status: AC
  Filled 2016-02-06: qty 1

## 2016-02-06 MED ORDER — FENTANYL CITRATE (PF) 100 MCG/2ML IJ SOLN
INTRAMUSCULAR | Status: DC | PRN
  Administered 2016-02-06 (×2): 50 ug via INTRAVENOUS

## 2016-02-06 MED ORDER — CEFAZOLIN SODIUM-DEXTROSE 2-3 GM-% IV SOLR
2.0000 g | INTRAVENOUS | Status: AC
  Administered 2016-02-06: 2 g via INTRAVENOUS

## 2016-02-06 MED ORDER — SODIUM CHLORIDE 0.9 % IJ SOLN
INTRAMUSCULAR | Status: AC
  Filled 2016-02-06: qty 10

## 2016-02-06 MED ORDER — MIDAZOLAM HCL 5 MG/5ML IJ SOLN
INTRAMUSCULAR | Status: DC | PRN
  Administered 2016-02-06: 2 mg via INTRAVENOUS

## 2016-02-06 MED ORDER — ONDANSETRON HCL 4 MG/2ML IJ SOLN
INTRAMUSCULAR | Status: DC | PRN
  Administered 2016-02-06: 4 mg via INTRAVENOUS

## 2016-02-06 MED ORDER — MIDAZOLAM HCL 2 MG/2ML IJ SOLN
INTRAMUSCULAR | Status: AC
  Filled 2016-02-06: qty 2

## 2016-02-06 MED ORDER — ONDANSETRON HCL 4 MG PO TABS: 4.0000 mg | ORAL_TABLET | Freq: Three times a day (TID) | ORAL | Status: DC | PRN

## 2016-02-06 MED ORDER — PROPOFOL 10 MG/ML IV BOLUS
INTRAVENOUS | Status: DC | PRN
  Administered 2016-02-06: 150 mg via INTRAVENOUS

## 2016-02-06 MED ORDER — MIDAZOLAM HCL 2 MG/2ML IJ SOLN: 1.0000 mg | INTRAMUSCULAR | Status: DC | PRN

## 2016-02-06 MED ORDER — DEXAMETHASONE SODIUM PHOSPHATE 10 MG/ML IJ SOLN
INTRAMUSCULAR | Status: DC | PRN
  Administered 2016-02-06: 10 mg via INTRAVENOUS

## 2016-02-06 MED ORDER — PHENYLEPHRINE HCL 10 MG/ML IJ SOLN
INTRAMUSCULAR | Status: AC
  Filled 2016-02-06: qty 1

## 2016-02-06 MED ORDER — ONDANSETRON HCL 4 MG/2ML IJ SOLN
INTRAMUSCULAR | Status: AC
  Filled 2016-02-06: qty 2

## 2016-02-06 MED ORDER — FENTANYL CITRATE (PF) 100 MCG/2ML IJ SOLN: 25.0000 ug | INTRAMUSCULAR | Status: DC | PRN

## 2016-02-06 MED ORDER — HYDROCODONE-ACETAMINOPHEN 7.5-325 MG PO TABS: 1.0000 | ORAL_TABLET | Freq: Four times a day (QID) | ORAL | Status: DC | PRN

## 2016-02-06 MED ORDER — PROPOFOL 10 MG/ML IV BOLUS
INTRAVENOUS | Status: AC
  Filled 2016-02-06: qty 40

## 2016-02-06 SURGICAL SUPPLY — 40 items
BANDAGE ACE 3X5.8 VEL STRL LF (GAUZE/BANDAGES/DRESSINGS) ×6 IMPLANT
BLADE MINI RND TIP GREEN BEAV (BLADE) ×3 IMPLANT
BLADE SURG 15 STRL LF DISP TIS (BLADE) ×2 IMPLANT
BLADE SURG 15 STRL SS (BLADE) ×4
BNDG ESMARK 4X9 LF (GAUZE/BANDAGES/DRESSINGS) ×6 IMPLANT
BRUSH SCRUB EZ PLAIN DRY (MISCELLANEOUS) ×6 IMPLANT
CORDS BIPOLAR (ELECTRODE) ×3 IMPLANT
COVER BACK TABLE 60X90IN (DRAPES) ×3 IMPLANT
COVER MAYO STAND STRL (DRAPES) ×6 IMPLANT
CUFF TOURNIQUET SINGLE 18IN (TOURNIQUET CUFF) ×6 IMPLANT
DECANTER SPIKE VIAL GLASS SM (MISCELLANEOUS) IMPLANT
DRAPE EXTREMITY T 121X128X90 (DRAPE) ×6 IMPLANT
DRAPE SURG 17X23 STRL (DRAPES) ×6 IMPLANT
GAUZE SPONGE 4X4 12PLY STRL (GAUZE/BANDAGES/DRESSINGS) ×3 IMPLANT
GAUZE SPONGE 4X4 16PLY XRAY LF (GAUZE/BANDAGES/DRESSINGS) IMPLANT
GAUZE XEROFORM 1X8 LF (GAUZE/BANDAGES/DRESSINGS) ×3 IMPLANT
GLOVE BIO SURGEON STRL SZ 6.5 (GLOVE) ×2 IMPLANT
GLOVE BIO SURGEONS STRL SZ 6.5 (GLOVE) ×1
GLOVE BIOGEL PI IND STRL 7.0 (GLOVE) ×1 IMPLANT
GLOVE BIOGEL PI INDICATOR 7.0 (GLOVE) ×2
GLOVE NEODERM STRL 7.5 LF PF (GLOVE) ×2 IMPLANT
GLOVE SURG NEODERM 7.5  LF PF (GLOVE) ×4
GLOVE SURG SYN 7.5  E (GLOVE) ×2
GLOVE SURG SYN 7.5 E (GLOVE) ×1 IMPLANT
GOWN PREVENTION PLUS XLARGE (GOWN DISPOSABLE) ×3 IMPLANT
GOWN STRL REIN XL XLG (GOWN DISPOSABLE) ×3 IMPLANT
NEEDLE HYPO 22GX1.5 SAFETY (NEEDLE) ×3 IMPLANT
NS IRRIG 1000ML POUR BTL (IV SOLUTION) ×3 IMPLANT
PACK BASIN DAY SURGERY FS (CUSTOM PROCEDURE TRAY) ×3 IMPLANT
PAD CAST 3X4 CTTN HI CHSV (CAST SUPPLIES) ×2 IMPLANT
PADDING CAST COTTON 3X4 STRL (CAST SUPPLIES) ×4
RUBBERBAND STERILE (MISCELLANEOUS) ×18 IMPLANT
SPLINT FIBERGLASS 3X35 (CAST SUPPLIES) ×3 IMPLANT
STOCKINETTE 4X48 STRL (DRAPES) ×6 IMPLANT
SUT ETHILON 4 0 PS 2 18 (SUTURE) ×6 IMPLANT
SYR BULB 3OZ (MISCELLANEOUS) ×3 IMPLANT
SYR CONTROL 10ML LL (SYRINGE) ×3 IMPLANT
TOWEL OR 17X24 6PK STRL BLUE (TOWEL DISPOSABLE) ×3 IMPLANT
TRAY DSU PREP LF (CUSTOM PROCEDURE TRAY) ×3 IMPLANT
UNDERPAD 30X30 (UNDERPADS AND DIAPERS) ×6 IMPLANT

## 2016-02-06 NOTE — Anesthesia Preprocedure Evaluation (Signed)
Anesthesia Evaluation  Patient identified by MRN, date of birth, ID band Patient awake    Reviewed: Allergy & Precautions, NPO status , Patient's Chart, lab work & pertinent test results  History of Anesthesia Complications Negative for: history of anesthetic complications  Airway Mallampati: II  TM Distance: >3 FB Neck ROM: Full    Dental no notable dental hx. (+) Dental Advisory Given   Pulmonary neg pulmonary ROS,    Pulmonary exam normal breath sounds clear to auscultation       Cardiovascular negative cardio ROS Normal cardiovascular exam Rhythm:Regular Rate:Normal     Neuro/Psych  Headaches, negative psych ROS   GI/Hepatic negative GI ROS, Neg liver ROS,   Endo/Other  diabetes  Renal/GU negative Renal ROS  negative genitourinary   Musculoskeletal negative musculoskeletal ROS (+)   Abdominal   Peds negative pediatric ROS (+)  Hematology negative hematology ROS (+)   Anesthesia Other Findings   Reproductive/Obstetrics negative OB ROS                             Anesthesia Physical Anesthesia Plan  ASA: II  Anesthesia Plan: General   Post-op Pain Management:    Induction: Intravenous  Airway Management Planned: LMA  Additional Equipment:   Intra-op Plan:   Post-operative Plan: Extubation in OR  Informed Consent: I have reviewed the patients History and Physical, chart, labs and discussed the procedure including the risks, benefits and alternatives for the proposed anesthesia with the patient or authorized representative who has indicated his/her understanding and acceptance.   Dental advisory given  Plan Discussed with: CRNA  Anesthesia Plan Comments:         Anesthesia Quick Evaluation

## 2016-02-06 NOTE — Transfer of Care (Signed)
Immediate Anesthesia Transfer of Care Note  Patient: [name]  Procedure(s) Performed: Procedure(s): BILATERAL CARPAL TUNNEL RELEASE (Bilateral)  Patient Location: PACU  Anesthesia Type:General  Level of Consciousness: sedated  Airway & Oxygen Therapy: Patient Spontanous Breathing and Patient connected to face mask oxygen  Post-op Assessment: Report given to RN and Post -op Vital signs reviewed and stable  Post vital signs: Reviewed and stable  Last Vitals:  Filed Vitals:   02/06/16 0802  BP: 130/75  Pulse: 83  Temp: 36.6 C  Resp: 20    Complications: No apparent anesthesia complications

## 2016-02-06 NOTE — H&P (Signed)
    PREOPERATIVE H&P  Chief Complaint: Bilateral carpal tunnel syndrome  HPI: [name] is a 35 y.o. female who presents for surgical treatment of Bilateral carpal tunnel syndrome.  She denies any changes in medical history.  Past Medical History  Diagnosis Date  . Headache     "seasonal"  . Non-insulin dependent type 2 diabetes mellitus ([place])     states only uses Insulin as needed  . Carpal tunnel syndrome on both sides 01/2016   Past Surgical History  Procedure Laterality Date  . No past surgeries     Social History   Social History  . Marital Status: Married    Spouse Name: N/A  . Number of Children: N/A  . Years of Education: N/A   Social History Main Topics  . Smoking status: Never Smoker   . Smokeless tobacco: Never Used  . Alcohol Use: No  . Drug Use: No  . Sexual Activity: Yes   Other Topics Concern  . None   Social History Narrative   History reviewed. No pertinent family history. No Known Allergies Prior to Admission medications   Medication Sig Start Date End Date Taking? Authorizing Provider  ibuprofen (ADVIL,MOTRIN) 600 MG tablet Take 1 tablet (600 mg total) by mouth every 6 (six) hours as needed. 10/17/15  Yes Lance Bosch, NP  metFORMIN (GLUCOPHAGE) 1000 MG tablet Take 1,000 mg by mouth daily with breakfast.   Yes Historical Provider, MD  Norgestimate-Ethinyl Estradiol Triphasic (ORTHO TRI-CYCLEN, 28,) 0.18/0.215/0.25 MG-35 MCG tablet Take 1 tablet by mouth daily.   Yes Historical Provider, MD  Blood Glucose Monitoring Suppl (TRUE METRIX AIR GLUCOSE METER) W/DEVICE KIT 1 Device by Does not apply route 4 (four) times daily -  before meals and at bedtime. 08/14/15   Lance Bosch, NP  glucose blood (TRUE METRIX BLOOD GLUCOSE TEST) test strip Use as instructed 08/02/15   Lance Bosch, NP  glucose blood test strip Use as instructed 09/29/14   Lance Bosch, NP  glucose monitoring kit (FREESTYLE) monitoring kit 1 each by Does not apply route as  needed for other. 08/14/14   Sahar Heloise Beecham, PA-C  insulin NPH-regular Human (NOVOLIN 70/30) (70-30) 100 UNIT/ML injection Inject into the skin.    Historical Provider, MD  Insulin Syringe-Needle U-100 30G X 1/2" 0.5 ML MISC Use as directed 08/14/14   Waldon Merl, PA-C  Lancets (FREESTYLE) lancets Use as instructed 09/29/14   Lance Bosch, NP     Positive ROS: All other systems have been reviewed and were otherwise negative with the exception of those mentioned in the HPI and as above.  Physical Exam: General: Alert, no acute distress Cardiovascular: No pedal edema Respiratory: No cyanosis, no use of accessory musculature GI: abdomen soft Skin: No lesions in the area of chief complaint Neurologic: Sensation intact distally Psychiatric: Patient is competent for consent with normal mood and affect Lymphatic: no lymphedema  MUSCULOSKELETAL: exam stable  Assessment: Bilateral carpal tunnel syndrome  Plan: Plan for Procedure(s): BILATERAL CARPAL TUNNEL RELEASE  The risks benefits and alternatives were discussed with the patient including but not limited to the risks of nonoperative treatment, versus surgical intervention including infection, bleeding, nerve injury,  blood clots, cardiopulmonary complications, morbidity, mortality, among others, and they were willing to proceed.   Marianna Payment, MD   02/06/2016 8:25 AM

## 2016-02-06 NOTE — Anesthesia Procedure Notes (Signed)
Procedure Name: LMA Insertion Date/Time: 02/06/2016 8:38 AM Performed by: Toula Moos L Pre-anesthesia Checklist: Patient identified, Emergency Drugs available, Suction available, Patient being monitored and Timeout performed Patient Re-evaluated:Patient Re-evaluated prior to inductionOxygen Delivery Method: Circle System Utilized Preoxygenation: Pre-oxygenation with 100% oxygen Intubation Type: IV induction Ventilation: Mask ventilation without difficulty LMA: LMA inserted LMA Size: 4.0 Number of attempts: 1 Airway Equipment and Method: Bite block Placement Confirmation: positive ETCO2 Tube secured with: Tape Dental Injury: Teeth and Oropharynx as per pre-operative assessment

## 2016-02-06 NOTE — Op Note (Signed)
   Carpal tunnel op note  DATE OF SURGERY:02/06/2016  PREOPERATIVE DIAGNOSIS:  Bilateral Carpal tunnel syndrome  POSTOPERATIVE DIAGNOSIS: same  PROCEDURE: Bilateral carpal tunnel release. CPT [id number]  SURGEON: Surgeon(s): Leandrew Koyanagi, MD  ANESTHESIA:  General  TOURNIQUET TIME: less than 10 minutes on each side  BLOOD LOSS: Minimal.  COMPLICATIONS: None.  PATHOLOGY: None.  INDICATIONS: The patient is a 35 y.o. -year-old female who presented with carpal tunnel syndrome failing nonsurgical management, indicated for surgical release.  DESCRIPTION OF PROCEDURE: The patient was identified in the preoperative holding area.  The operative site was marked by the surgeon and confirmed by the patient.  He was brought back to the operating room.  Anesthesia was induced by the anesthesia team.  A well padded nonsterile tourniquet was placed. The operative extremity was prepped and draped in standard sterile fashion.  A timeout was performed.  Preoperative antibiotics were given.   We began with the left hand.  A palmar incision was made about 5 mm ulnar to the thenar crease.  The palmar aponeurosis was exposed and divided in line with the skin incision. The palmaris brevis was visualized and divided.  The distal edge of the transcarpal ligament was identified. A hemostat was inserted into the carpal tunnel to protect the median nerve and the flexor tendons. Then, the transverse carpal ligament was released under direct visualization. Proximally, a subcutaneous tunnel was made allowing a Sewell retractor to be placed. Then, the distal portion of the antebrachial fascia was released. Distally, all fibrous bands were released. The median nerve was visualized, and the fat pad was exposed. Following release, local infiltration with 0.25% of Sensorcaine was given. The tourniquet was deflated. Hemostasis achieved.  Wound was irrigated and closed with 4-0 nylon sutures. Sterile dressing applied. We then  repeated the same surgery on the right hand.  The patient was transferred to the recovery room in stable condition after all counts were correct.  POSTOPERATIVE PLAN: To start nerve gliding exercises as tolerated and no heavy lifting for four weeks.

## 2016-02-06 NOTE — Discharge Instructions (Signed)
Postoperative instructions: ° °Weightbearing: as tolerated, no heavy lifting ° °Dressing instructions: Keep your dressing and/or splint clean and dry at all times.  It will be removed at your first post-operative appointment.  Your stitches and/or staples will be removed at this visit. ° °Incision instructions:  Do not soak your incision for 3 weeks after surgery.  If the incision gets wet, pat dry and do not scrub the incision. ° °Pain control:  You have been given a prescription to be taken as directed for post-operative pain control.  In addition, elevate the operative extremity above the heart at all times to prevent swelling and throbbing pain. ° °Take over-the-counter Colace, 100mg by mouth twice a day while taking narcotic pain medications to help prevent constipation. ° °Follow up appointments: °1) 10-14 days for suture removal and wound check. °2) Dr. Xu as scheduled. ° ° ------------------------------------------------------------------------------------------------------------- ° °After Surgery Pain Control: ° °After your surgery, post-surgical discomfort or pain is likely. This discomfort can last several days to a few weeks. At certain times of the day your discomfort may be more intense.  °Did you receive a nerve block?  °A nerve block can provide pain relief for one hour to two days after your surgery. As long as the nerve block is working, you will experience little or no sensation in the area the surgeon operated on.  °As the nerve block wears off, you will begin to experience pain or discomfort. It is very important that you begin taking your prescribed pain medication before the nerve block fully wears off. Treating your pain at the first sign of the block wearing off will ensure your pain is better controlled and more tolerable when full-sensation returns. Do not wait until the pain is intolerable, as the medicine will be less effective. It is better to treat pain in advance than to try and catch  up.  °General Anesthesia:  °If you did not receive a nerve block during your surgery, you will need to start taking your pain medication shortly after your surgery and should continue to do so as prescribed by your surgeon.  °Pain Medication:  °Most commonly we prescribe Vicodin and Percocet for post-operative pain. Both of these medications contain a combination of acetaminophen (Tylenol®) and a narcotic to help control pain.  °· It takes between 30 and 45 minutes before pain medication starts to work. It is important to take your medication before your pain level gets too intense.  °· Nausea is a common side effect of many pain medications. You will want to eat something before taking your pain medicine to help prevent nausea.  °· If you are taking a prescription pain medication that contains acetaminophen, we recommend that you do not take additional over the counter acetaminophen (Tylenol®).  °Other pain relieving options:  °· Using a cold pack to ice the affected area a few times a day (15 to 20 minutes at a time) can help to relieve pain, reduce swelling and bruising.  °· Elevation of the affected area can also help to reduce pain and swelling. ° ° ° ° ° °Post Anesthesia Home Care Instructions ° °Activity: °Get plenty of rest for the remainder of the day. A responsible adult should stay with you for 24 hours following the procedure.  °For the next 24 hours, DO NOT: °-Drive a car °-Operate machinery °-Drink alcoholic beverages °-Take any medication unless instructed by your physician °-Make any legal decisions or sign important papers. ° °Meals: °Start with liquid foods such   as gelatin or soup. Progress to regular foods as tolerated. Avoid greasy, spicy, heavy foods. If nausea and/or vomiting occur, drink only clear liquids until the nausea and/or vomiting subsides. Call your physician if vomiting continues. ° °Special Instructions/Symptoms: °Your throat may feel dry or sore from the anesthesia or the breathing  tube placed in your throat during surgery. If this causes discomfort, gargle with warm salt water. The discomfort should disappear within 24 hours. ° °If you had a scopolamine patch placed behind your ear for the management of post- operative nausea and/or vomiting: ° °1. The medication in the patch is effective for 72 hours, after which it should be removed.  Wrap patch in a tissue and discard in the trash. Wash hands thoroughly with soap and water. °2. You may remove the patch earlier than 72 hours if you experience unpleasant side effects which may include dry mouth, dizziness or visual disturbances. °3. Avoid touching the patch. Wash your hands with soap and water after contact with the patch. °  ° °

## 2016-02-07 ENCOUNTER — Encounter (HOSPITAL_BASED_OUTPATIENT_CLINIC_OR_DEPARTMENT_OTHER): Payer: Self-pay | Admitting: Orthopaedic Surgery

## 2016-02-07 NOTE — Anesthesia Postprocedure Evaluation (Signed)
Anesthesia Post Note  Patient: [name]  Procedure(s) Performed: Procedure(s) (LRB): BILATERAL CARPAL TUNNEL RELEASE (Bilateral)  Patient location during evaluation: PACU Anesthesia Type: General Level of consciousness: awake and alert Pain management: pain level controlled Vital Signs Assessment: post-procedure vital signs reviewed and stable Respiratory status: spontaneous breathing, nonlabored ventilation, respiratory function stable and patient connected to nasal cannula oxygen Cardiovascular status: blood pressure returned to baseline and stable Postop Assessment: no signs of nausea or vomiting Anesthetic complications: no    Last Vitals:  Filed Vitals:   02/06/16 1040 02/06/16 1200  BP:  114/68  Pulse: 75 81  Temp:  36.6 C  Resp: 16 18    Last Pain:  Filed Vitals:   02/06/16 1201  PainSc: 4                  Eain Mullendore JENNETTE

## 2016-07-18 ENCOUNTER — Ambulatory Visit: Payer: Self-pay | Attending: Internal Medicine

## 2016-08-27 IMAGING — US US RENAL
1 series · 14 of 25 positions shown · non-contrast
Comparison: 08/11/2014 and 08/10/2014

CLINICAL DATA: Pyuria for 1 month. No prior surgeries. History of
hydronephrosis and pyelonephritis.

EXAM:
RENAL/URINARY TRACT ULTRASOUND COMPLETE

[Series 1: us renal · 0.20mm/px · 14 of 32 slices shown]
[im 1/32]
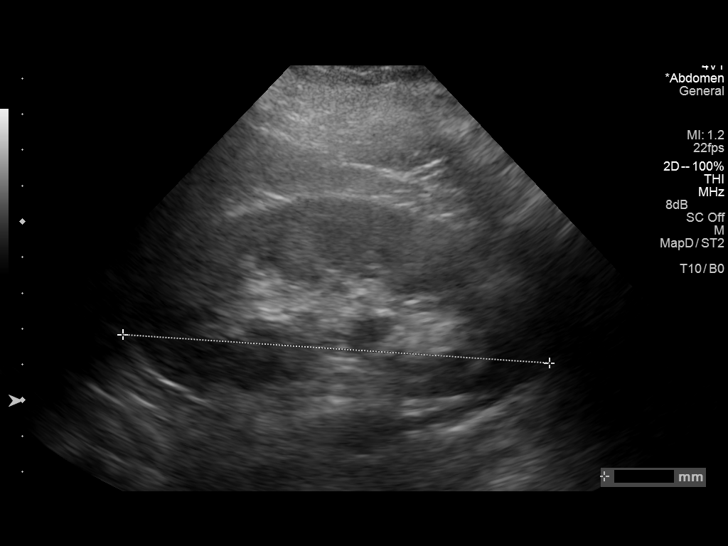
[im 3/32]
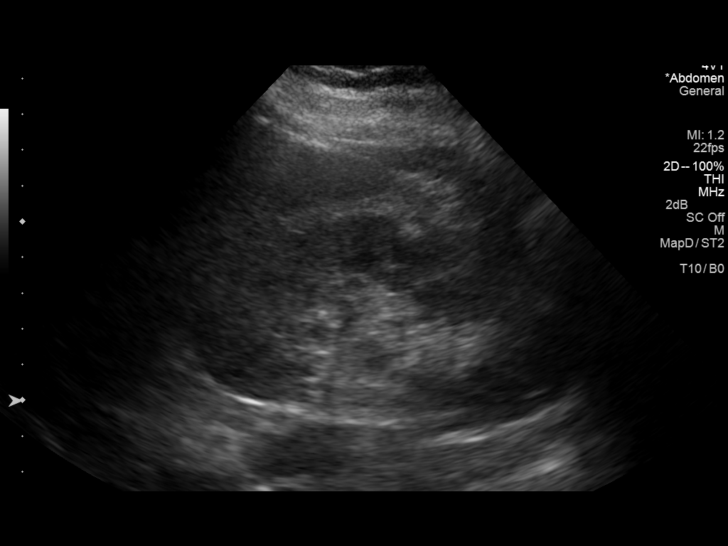
[im 6/32]
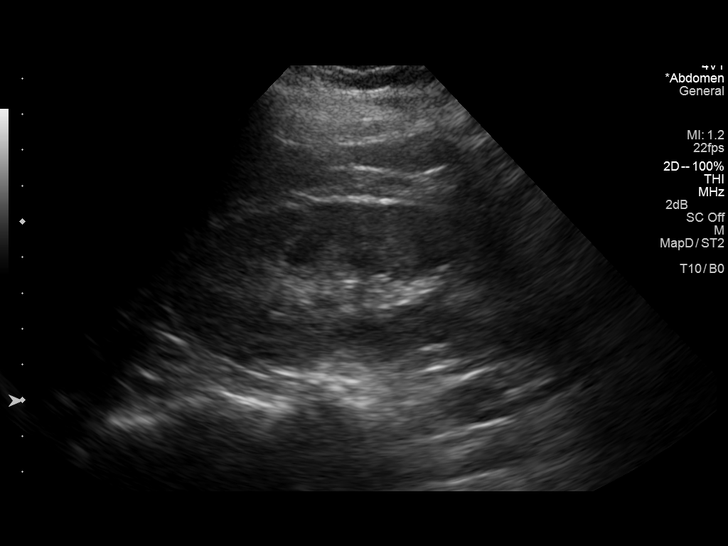
[im 8/32]
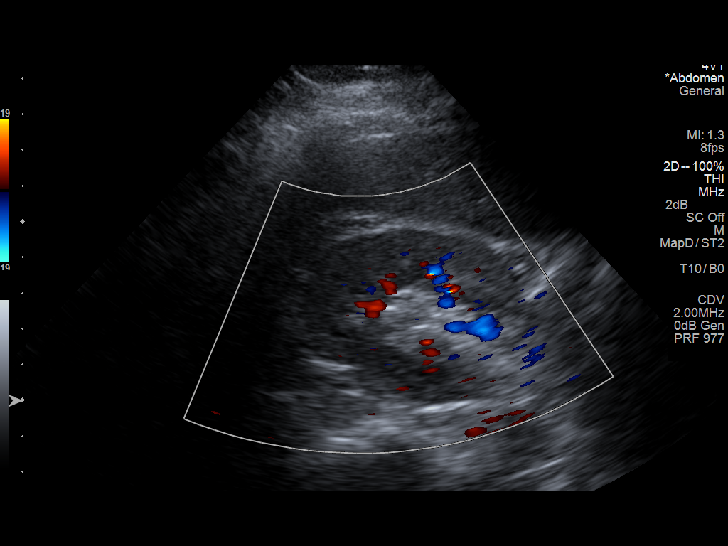
[im 11/32]
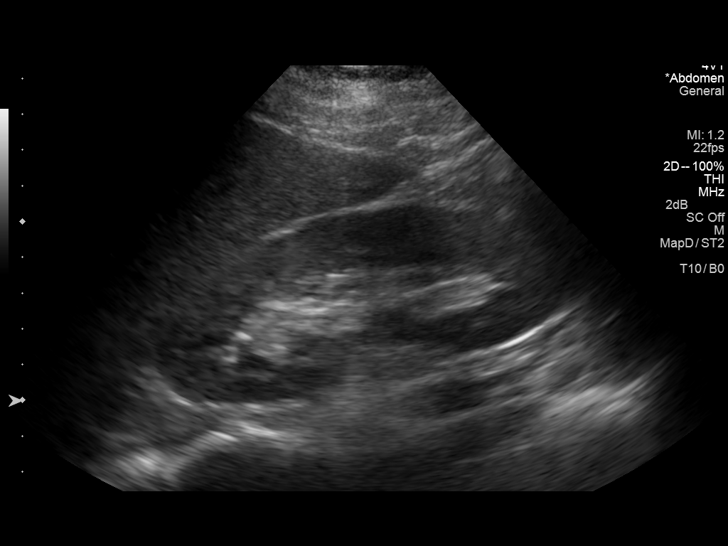
[im 12/32]
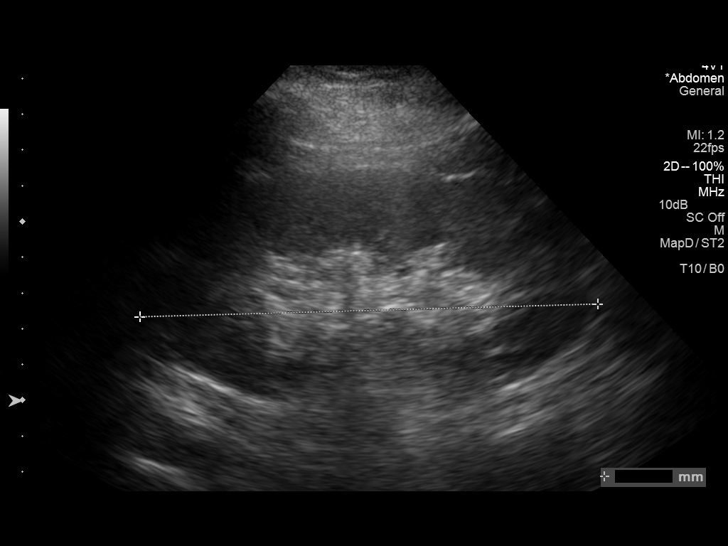
[im 15/32]
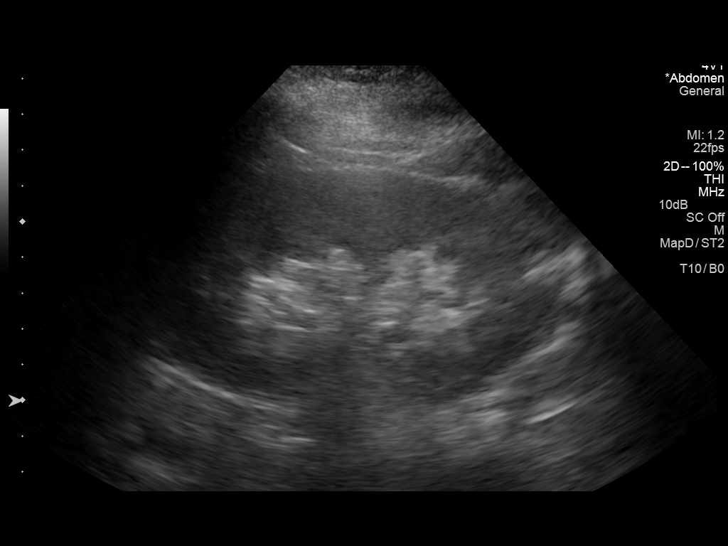
[im 17/32]
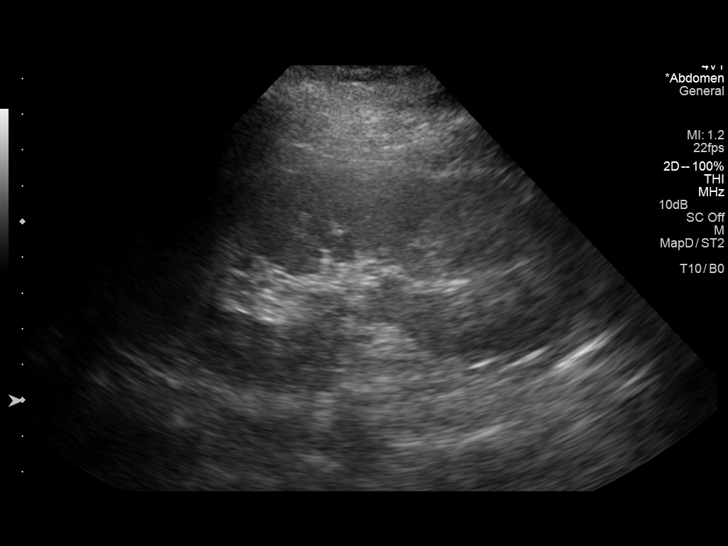
[im 20/32]
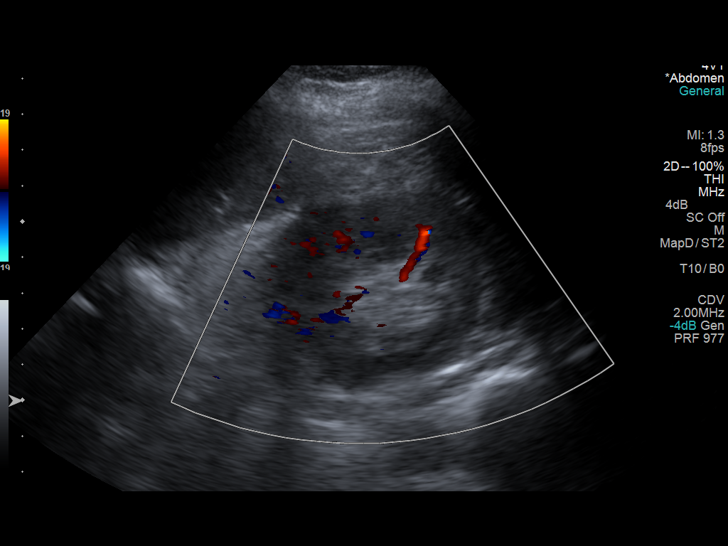
[im 21/32]
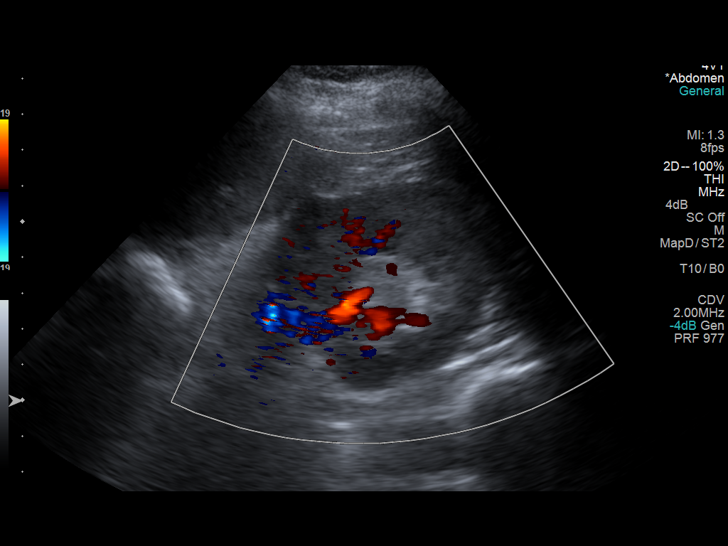
[im 24/32]
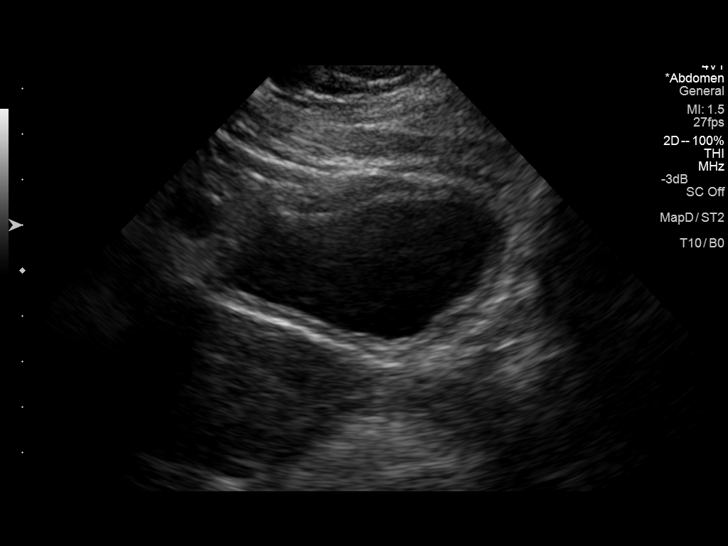
[im 26/32]
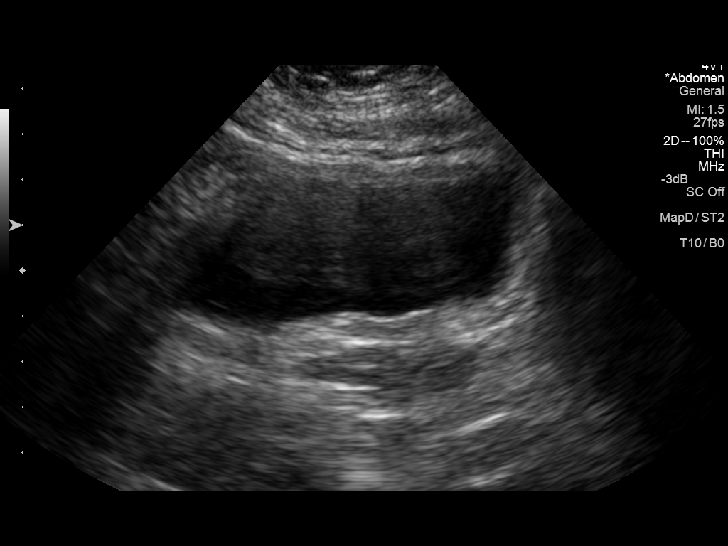
[im 29/32]
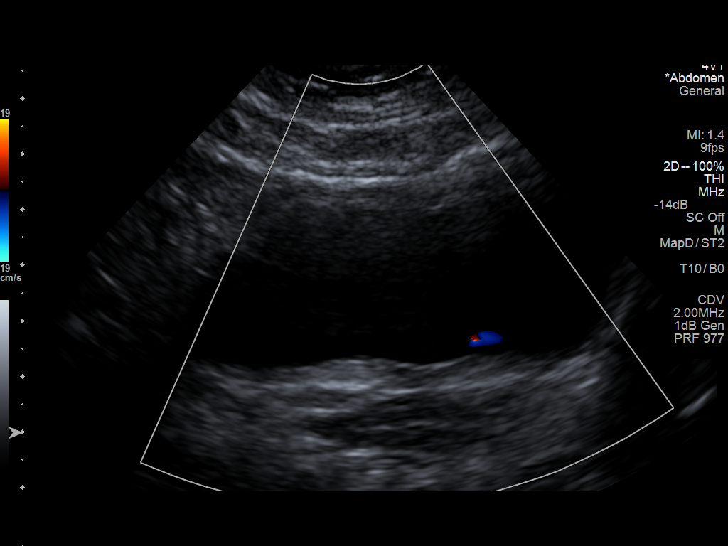
[im 32/32]
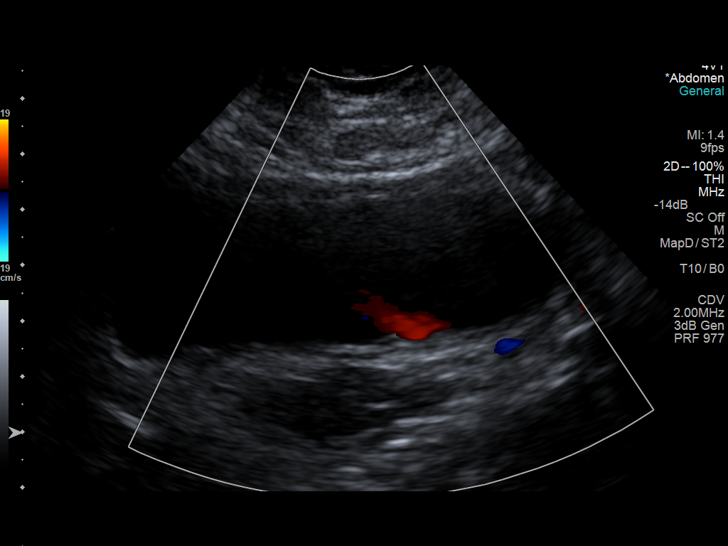

[14 of 25 positions shown; findings below may reference images not displayed]

FINDINGS: Right Kidney:

Length: 12.0 cm. Echogenicity within normal limits. No mass or
hydronephrosis visualized.

Left Kidney:

Length: 12.8 cm. Echogenicity within normal limits. No mass or
hydronephrosis visualized.

Bladder:

Appears normal for degree of bladder distention. Bilateral ureteral
jets are identified.
IMPRESSION: Normal renal ultrasound.

## 2016-10-28 ENCOUNTER — Ambulatory Visit: Payer: Self-pay | Attending: Internal Medicine

## 2016-11-19 ENCOUNTER — Other Ambulatory Visit: Payer: Self-pay | Admitting: Pediatrics

## 2016-11-19 MED ORDER — PERMETHRIN 5 % EX CREA
1.0000 | TOPICAL_CREAM | Freq: Once | CUTANEOUS | 0 refills | Status: AC
Start: 2016-11-19 — End: 2016-11-19

## 2016-11-19 NOTE — Progress Notes (Signed)
Treating household contacts of scabies

## 2017-01-28 ENCOUNTER — Ambulatory Visit: Payer: Self-pay

## 2017-03-18 ENCOUNTER — Ambulatory Visit: Payer: Self-pay | Attending: Family Medicine | Admitting: Family Medicine

## 2017-03-18 VITALS — BP 118/75 | HR 85 | Temp 98.2°F | Resp 18 | Ht 61.0 in | Wt 161.8 lb

## 2017-03-18 DIAGNOSIS — B351 Tinea unguium: Secondary | ICD-10-CM | POA: Insufficient documentation

## 2017-03-18 DIAGNOSIS — G5603 Carpal tunnel syndrome, bilateral upper limbs: Secondary | ICD-10-CM | POA: Insufficient documentation

## 2017-03-18 DIAGNOSIS — N3001 Acute cystitis with hematuria: Secondary | ICD-10-CM | POA: Insufficient documentation

## 2017-03-18 DIAGNOSIS — E119 Type 2 diabetes mellitus without complications: Secondary | ICD-10-CM | POA: Insufficient documentation

## 2017-03-18 DIAGNOSIS — Z79899 Other long term (current) drug therapy: Secondary | ICD-10-CM | POA: Insufficient documentation

## 2017-03-18 DIAGNOSIS — Z794 Long term (current) use of insulin: Secondary | ICD-10-CM | POA: Insufficient documentation

## 2017-03-18 LAB — POCT URINALYSIS DIPSTICK
Bilirubin, UA: NEGATIVE
Glucose, UA: 500
KETONES UA: NEGATIVE
NITRITE UA: NEGATIVE
PH UA: 7 (ref 5.0–8.0)
Spec Grav, UA: 1.015 (ref 1.030–1.035)
Urobilinogen, UA: 0.2 (ref ?–2.0)

## 2017-03-18 LAB — POCT GLYCOSYLATED HEMOGLOBIN (HGB A1C): HEMOGLOBIN A1C: 8.8

## 2017-03-18 LAB — GLUCOSE, POCT (MANUAL RESULT ENTRY)
POC GLUCOSE: 301 mg/dL — AB (ref 70–99)
POC Glucose: 231 mg/dl — AB (ref 70–99)

## 2017-03-18 MED ORDER — GLUCOSE BLOOD VI STRP: ORAL_STRIP | 12 refills | Status: DC

## 2017-03-18 MED ORDER — NITROFURANTOIN MONOHYD MACRO 100 MG PO CAPS: 100.0000 mg | ORAL_CAPSULE | Freq: Two times a day (BID) | ORAL | 0 refills | Status: DC

## 2017-03-18 MED ORDER — INSULIN ASPART 100 UNIT/ML ~~LOC~~ SOLN
10.0000 [IU] | Freq: Once | SUBCUTANEOUS | Status: AC
  Administered 2017-03-18: 10 [IU] via SUBCUTANEOUS

## 2017-03-18 MED ORDER — "INSULIN SYRINGE-NEEDLE U-100 30G X 1/2"" 0.5 ML MISC": 0 refills | Status: DC

## 2017-03-18 MED ORDER — INSULIN GLARGINE 100 UNIT/ML SOLOSTAR PEN: 10.0000 [IU] | PEN_INJECTOR | Freq: Every day | SUBCUTANEOUS | 11 refills | Status: DC

## 2017-03-18 MED ORDER — METFORMIN HCL 1000 MG PO TABS: 1000.0000 mg | ORAL_TABLET | Freq: Two times a day (BID) | ORAL | 2 refills | Status: DC

## 2017-03-18 MED ORDER — TRUEPLUS LANCETS 28G MISC: 1.0000 | Freq: Once | 12 refills | Status: AC

## 2017-03-18 NOTE — Patient Instructions (Addendum)
Apply for orange card to complete referral process. Start taking blood sugars twice a day, morning and evening.  Bring log of blood sugars or glucose meter to next office visit.   Infeccin urinaria en los adultos (Urinary Tract Infection, Adult) Una infeccin urinaria (IU) puede ocurrir en Clinical cytogeneticist de [place]. Las vas urinarias incluyen lo siguiente:  Riones.  Urteres.  Vejiga.  Uretra. Estos rganos fabrican, Buyer, retail y eliminan la orina del organismo. [place] los medicamentos de venta libre y los recetados solamente como se lo haya indicado el mdico.  Si le recetaron un antibitico, tmelo como se lo haya indicado el mdico. No deje de tomar los antibiticos aunque comience a sentirse mejor.  Evite beber lo siguiente:  Alcohol.  Cafena.  T.  Bebidas con gas.  Beba suficiente lquido para mantener el pis claro o de color amarillo plido.  Concurra a todas las visitas de control como se lo haya indicado el mdico. Esto es importante.  Asegrese de lo siguiente:  Vaciar la vejiga con frecuencia y en su totalidad. No contener la orina durante largos perodos.  Vaciar la vejiga antes y despus de Clinical biochemist.  Limpiar de adelante hacia atrs despus de defecar, si es mujer. Usar cada trozo de papel una vez cuando se limpie. SOLICITE AYUDA SI:  Siente dolor en la espalda.  Tiene fiebre.  Siente malestar estomacal (nuseas).  Vomita.  Los sntomas no mejoran despus de 3das de tratamiento.  Los sntomas desaparecen y Teacher, adult education. SOLICITE AYUDA DE INMEDIATO SI:  Siente un dolor muy intenso en la espalda.  Siente un dolor muy intenso en la parte inferior del abdomen.  Tiene vmitos y no puede retener los medicamentos ni el agua. Esta informacin no tiene Marine scientist el consejo del mdico. Asegrese de hacerle al mdico cualquier pregunta que tenga. Document Released: 05/21/2010  Document Revised: 03/24/2016 Document Reviewed: 10/22/2015 Elsevier Interactive Patient Education  2017 [employer].

## 2017-03-18 NOTE — Progress Notes (Signed)
Subjective:  Patient ID: [name], female    DOB: Sep 05, 1981  Age: 36 y.o. MRN: [id number]  CC: No chief complaint on file.   HPI [name] presents for   [id number] [facility]   DM: 70/30 Novolog use.She reports not using insulin regularly only when blood sugar is greater than 120. Reports history of insulin use for 3 years. Denies any visual changes, N/V, wounds, or paresthesias. She does not check CBG regularly. Reports last checking CBG last month with blood glucose result of 240. Request more glucometer supplies.   Weight gain: Reports weight gain of 7 lbs within the last month. Denies any BLE swelling, SOB, or changes to bowel or bladder habiits. Reports family history of thyroid problems. Denies any constipation or cold intolerance.    Outpatient Medications Prior to Visit  Medication Sig Dispense Refill  . Blood Glucose Monitoring Suppl (TRUE METRIX AIR GLUCOSE METER) W/DEVICE KIT 1 Device by Does not apply route 4 (four) times daily -  before meals and at bedtime. 1 kit 0  . glucose monitoring kit (FREESTYLE) monitoring kit 1 each by Does not apply route as needed for other. 1 each 0  . HYDROcodone-acetaminophen (NORCO) 7.5-325 MG tablet Take 1-2 tablets by mouth every 6 (six) hours as needed for moderate pain. 90 tablet 0  . ibuprofen (ADVIL,MOTRIN) 600 MG tablet Take 1 tablet (600 mg total) by mouth every 6 (six) hours as needed. 60 tablet 0  . Lancets (FREESTYLE) lancets Use as instructed 100 each 12  . Norgestimate-Ethinyl Estradiol Triphasic (ORTHO TRI-CYCLEN, 28,) 0.18/0.215/0.25 MG-35 MCG tablet Take 1 tablet by mouth daily.    . ondansetron (ZOFRAN) 4 MG tablet Take 1-2 tablets (4-8 mg total) by mouth every 8 (eight) hours as needed for nausea or vomiting. 40 tablet 0  . glucose blood (TRUE METRIX BLOOD GLUCOSE TEST) test strip Use as instructed 100 each 12  . glucose blood test strip Use as instructed 100 each 0  . insulin NPH-regular Human (NOVOLIN 70/30)  (70-30) 100 UNIT/ML injection Inject into the skin.    . Insulin Syringe-Needle U-100 30G X 1/2" 0.5 ML MISC Use as directed 10 each 0  . metFORMIN (GLUCOPHAGE) 1000 MG tablet Take 1,000 mg by mouth daily with breakfast.     No facility-administered medications prior to visit.     ROS Review of Systems  Constitutional: Positive for unexpected weight change.  Respiratory: Negative.   Cardiovascular: Negative.   Gastrointestinal: Negative.   Endocrine: Negative.   Skin: Negative.   Neurological: Negative.     Objective:  BP 118/75 (BP Location: Left Arm, Patient Position: Sitting, Cuff Size: Normal)   Pulse 85   Temp 98.2 F (36.8 C) (Oral)   Resp 18   Ht _0  (1.549 m)   Wt 161 lb 12.8 oz (73.4 kg)   SpO2 98%   BMI 30.57 kg/m   BP/Weight 03/22/2017 03/18/2017 2/42/6834  Systolic BP 196 222 979  Diastolic BP 87 75 68  Wt. (Lbs) 161 161.8 153  BMI 30.42 30.57 28.92    Physical Exam  Constitutional: She appears well-developed and well-nourished.  HENT:  Head: Normocephalic.  Right Ear: External ear normal.  Left Ear: External ear normal.  Nose: Nose normal.  Mouth/Throat: Oropharynx is clear and moist.  Eyes: Conjunctivae are normal. Pupils are equal, round, and reactive to light.  Neck: No JVD present.  Cardiovascular: Normal rate, regular rhythm, normal heart sounds and intact distal pulses.   Pulmonary/Chest: Effort  normal and breath sounds normal.  Abdominal: Soft. Bowel sounds are normal.  Skin: Skin is warm and dry.  Nursing note and vitals reviewed.  Assessment & Plan:   Problem List Items Addressed This Visit      Endocrine   DM type 2 (diabetes mellitus, type 2) ([place]) - Primary   Start taking CBG TID and bring log to follow up appointment in 2 weeks.   Follow up with PCP in 3 months.   Relevant Medications   insulin aspart (novoLOG) injection 10 Units (Completed)   glucose blood (TRUE METRIX BLOOD GLUCOSE TEST) test strip   metFORMIN (GLUCOPHAGE) 1000  MG tablet   Insulin Syringe-Needle U-100 30G X 1/2" 0.5 ML MISC   Insulin Glargine (LANTUS SOLOSTAR) 100 UNIT/ML Solostar Pen   Other Relevant Orders   Glucose (CBG) (Completed)   HgB A1c (Completed)   Urinalysis Dipstick (Completed)   CMP14+EGFR (Completed)   Lipid Panel (Completed)   Microalbumin/Creatinine Ratio, Urine (Completed)   Ambulatory referral to Ophthalmology   Glucose (CBG) (Completed)    Other Visit Diagnoses    Acute cystitis with hematuria       Relevant Medications   nitrofurantoin, macrocrystal-monohydrate, (MACROBID) 100 MG capsule   Onychomycosis of left great toe       -LFT ordered once resulted and evaluated antifungal will be prescribed.   Relevant Orders   CMP14+EGFR (Completed)   Bilateral carpal tunnel syndrome          Meds ordered this encounter  Medications  . insulin aspart (novoLOG) injection 10 Units  . glucose blood (TRUE METRIX BLOOD GLUCOSE TEST) test strip    Sig: Use as instructed    Dispense:  100 each    Refill:  12    Order Specific Question:   Supervising Provider    Answer:   Tresa Garter [[id number]]  . TRUEPLUS LANCETS 28G MISC    Sig: 1 kit by Does not apply route once.    Dispense:  100 each    Refill:  12    Order Specific Question:   Supervising Provider    Answer:   Tresa Garter [id number]  . metFORMIN (GLUCOPHAGE) 1000 MG tablet    Sig: Take 1 tablet (1,000 mg total) by mouth 2 (two) times daily with a meal.    Dispense:  60 tablet    Refill:  2    Order Specific Question:   Supervising Provider    Answer:   Tresa Garter [id number]  . Insulin Syringe-Needle U-100 30G X 1/2" 0.5 ML MISC    Sig: Use as directed    Dispense:  10 each    Refill:  0    Order Specific Question:   Supervising Provider    Answer:   Tresa Garter [id number]  . Insulin Glargine (LANTUS SOLOSTAR) 100 UNIT/ML Solostar Pen    Sig: Inject 10 Units into the skin daily at 10 pm.    Dispense:  15 mL    Refill:  11     Order Specific Question:   Supervising Provider    Answer:   Tresa Garter [[id number]]  . nitrofurantoin, macrocrystal-monohydrate, (MACROBID) 100 MG capsule    Sig: Take 1 capsule (100 mg total) by mouth 2 (two) times daily. Take with food.    Dispense:  14 capsule    Refill:  0    Order Specific Question:   Supervising Provider    Answer:   Tresa Garter [id number]  Follow-up: Return in about 2 weeks (around 04/01/2017) for Diabetes with clinical pharmacist   Alfonse Spruce FNP

## 2017-03-18 NOTE — Progress Notes (Signed)
Patient is here for establish care  Patient denies pain for today  Patient have been with out insulin for 2 weeks

## 2017-03-19 LAB — CMP14+EGFR
ALT: 24 IU/L (ref 0–32)
AST: 28 IU/L (ref 0–40)
Albumin/Globulin Ratio: 1.1 — ABNORMAL LOW (ref 1.2–2.2)
Albumin: 4 g/dL (ref 3.5–5.5)
Alkaline Phosphatase: 65 IU/L (ref 39–117)
BUN/Creatinine Ratio: 20 (ref 9–23)
BUN: 11 mg/dL (ref 6–20)
Bilirubin Total: 0.2 mg/dL (ref 0.0–1.2)
CO2: 21 mmol/L (ref 18–29)
Calcium: 9.1 mg/dL (ref 8.7–10.2)
Chloride: 103 mmol/L (ref 96–106)
Creatinine, Ser: 0.54 mg/dL — ABNORMAL LOW (ref 0.57–1.00)
GFR calc Af Amer: 141 mL/min/{1.73_m2} (ref >59)
GFR calc non Af Amer: 123 mL/min/{1.73_m2} (ref >59)
Globulin, Total: 3.5 g/dL (ref 1.5–4.5)
Glucose: 230 mg/dL — ABNORMAL HIGH (ref 65–99)
Potassium: 4.2 mmol/L (ref 3.5–5.2)
Sodium: 140 mmol/L (ref 134–144)
Total Protein: 7.5 g/dL (ref 6.0–8.5)

## 2017-03-19 LAB — LIPID PANEL
Chol/HDL Ratio: 3.9 ratio (ref 0.0–4.4)
Cholesterol, Total: 192 mg/dL (ref 100–199)
HDL: 49 mg/dL (ref >39)
LDL Calculated: 75 mg/dL (ref 0–99)
TRIGLYCERIDES: 340 mg/dL — AB (ref 0–149)
VLDL CHOLESTEROL CAL: 68 mg/dL — AB (ref 5–40)

## 2017-03-19 LAB — MICROALBUMIN / CREATININE URINE RATIO
Creatinine, Urine: 44.2 mg/dL
MICROALBUM., U, RANDOM: 74.9 ug/mL
Microalb/Creat Ratio: 169.5 mg/g creat — ABNORMAL HIGH (ref 0.0–30.0)

## 2017-03-20 ENCOUNTER — Telehealth: Payer: Self-pay

## 2017-03-20 ENCOUNTER — Other Ambulatory Visit: Payer: Self-pay | Admitting: Family Medicine

## 2017-03-20 DIAGNOSIS — E785 Hyperlipidemia, unspecified: Secondary | ICD-10-CM

## 2017-03-20 DIAGNOSIS — E1169 Type 2 diabetes mellitus with other specified complication: Secondary | ICD-10-CM

## 2017-03-20 DIAGNOSIS — R809 Proteinuria, unspecified: Secondary | ICD-10-CM

## 2017-03-20 MED ORDER — ROSUVASTATIN CALCIUM 20 MG PO TABS: 20.0000 mg | ORAL_TABLET | Freq: Every day | ORAL | 0 refills | Status: DC

## 2017-03-20 MED ORDER — LISINOPRIL 2.5 MG PO TABS: 2.5000 mg | ORAL_TABLET | Freq: Every day | ORAL | 0 refills | Status: DC

## 2017-03-20 NOTE — Telephone Encounter (Signed)
[name] call patient to inform about lab results  Patient Verify DOB  Patient was aware and  understood

## 2017-03-20 NOTE — Telephone Encounter (Signed)
-----   Message from Alfonse Spruce, [place] sent at 03/20/2017  3:08 PM EDT ----- Microalbumin/creatinine ratio level was elevated. This tests for protein in your urine that could indicate early signs of kidney damage. You were prescribed lisinopril to help lower risk. Recommend recheck in 3 months. Lipid levels were elevated. This can increase your risk of heart disease. You were prescribed Crestor to help lower risk . Recommend recheck in 3 months.  Liver function normal Kidney function normal.

## 2017-03-22 ENCOUNTER — Emergency Department (HOSPITAL_COMMUNITY)
Admission: EM | Admit: 2017-03-22 | Discharge: 2017-03-23 | Disposition: A | Discharge status: Home / Self Care | Payer: Self-pay | Attending: Emergency Medicine | Admitting: Emergency Medicine

## 2017-03-22 ENCOUNTER — Encounter (HOSPITAL_COMMUNITY): Payer: Self-pay | Admitting: Emergency Medicine

## 2017-03-22 DIAGNOSIS — E119 Type 2 diabetes mellitus without complications: Secondary | ICD-10-CM | POA: Insufficient documentation

## 2017-03-22 DIAGNOSIS — R51 Headache: Secondary | ICD-10-CM | POA: Insufficient documentation

## 2017-03-22 DIAGNOSIS — R519 Headache, unspecified: Secondary | ICD-10-CM

## 2017-03-22 DIAGNOSIS — Z794 Long term (current) use of insulin: Secondary | ICD-10-CM | POA: Insufficient documentation

## 2017-03-22 LAB — I-STAT CHEM 8, ED
BUN: 6 mg/dL (ref 6–20)
CALCIUM ION: 1.07 mmol/L — AB (ref 1.15–1.40)
CHLORIDE: 105 mmol/L (ref 101–111)
CREATININE: 0.5 mg/dL (ref 0.44–1.00)
GLUCOSE: 210 mg/dL — AB (ref 65–99)
HCT: 42 % (ref 36.0–46.0)
Hemoglobin: 14.3 g/dL (ref 12.0–15.0)
POTASSIUM: 3.7 mmol/L (ref 3.5–5.1)
Sodium: 139 mmol/L (ref 135–145)
TCO2: 22 mmol/L (ref 0–100)

## 2017-03-22 MED ORDER — SODIUM CHLORIDE 0.9 % IV BOLUS (SEPSIS)
1000.0000 mL | Freq: Once | INTRAVENOUS | Status: AC
Start: 2017-03-22 — End: 2017-03-23
  Administered 2017-03-23: 1000 mL via INTRAVENOUS

## 2017-03-22 MED ORDER — METOCLOPRAMIDE HCL 5 MG/ML IJ SOLN
10.0000 mg | INTRAMUSCULAR | Status: AC
  Administered 2017-03-23: 10 mg via INTRAVENOUS
  Filled 2017-03-22: qty 2

## 2017-03-22 MED ORDER — KETOROLAC TROMETHAMINE 15 MG/ML IJ SOLN
15.0000 mg | Freq: Once | INTRAMUSCULAR | Status: AC
  Administered 2017-03-23: 15 mg via INTRAVENOUS
  Filled 2017-03-22: qty 1

## 2017-03-22 NOTE — ED Provider Notes (Signed)
[facility] DEPT Provider Note   CSN: [id number] Arrival date & time: 03/22/17  1803    History   Chief Complaint Chief Complaint  Patient presents with  . Headache    HPI SHARETA FISHBAUGH is a 36 y.o. female.  36 year old female with a history of seasonal headaches and diabetes presents to the emergency department for complaints of a right-sided headache which began 2 weeks ago. Symptoms have been waxing and waning in severity. Patient noticed worsening of her symptoms 3 days ago. She has taken over-the-counter ibuprofen for symptoms without relief. She does report a history of similar headaches, but denies them lasting this long. She has had some associated intermittent blurry vision as well as some right-sided photophobia. No fevers, vomiting, head trauma, extremity numbness or paresthesias, extremity weakness. No neck stiffness. Patient reports compliance with her daily medications.   The history is provided by the patient. No language interpreter was used.  Headache      Past Medical History:  Diagnosis Date  . Carpal tunnel syndrome on both sides 01/2016  . Headache    "seasonal"  . Non-insulin dependent type 2 diabetes mellitus ([place])    states only uses Insulin as needed    Patient Active Problem List   Diagnosis Date Noted  . DM type 2 (diabetes mellitus, type 2) ([place]) 08/02/2015  . Hydronephrosis 08/10/2014  . Hypokalemia 08/10/2014  . Nausea and vomiting 08/10/2014  . Dehydration 08/10/2014  . Pyelonephritis, acute 08/10/2014  . Hyperglycemia 08/10/2014  . DUB (dysfunctional uterine bleeding) 07/14/2014  . Uterine fibroid 07/14/2014    Past Surgical History:  Procedure Laterality Date  . BILATERAL CARPAL TUNNEL RELEASE Bilateral 02/06/2016   Procedure: BILATERAL CARPAL TUNNEL RELEASE;  Surgeon: Leandrew Koyanagi, MD;  Location: [facility];  Service: Orthopedics;  Laterality: Bilateral;  . NO PAST SURGERIES      OB History    Gravida Para  Term Preterm AB Living   _0 0 3   SAB TAB Ectopic Multiple Live Births           2       Home Medications    Prior to Admission medications   Medication Sig Start Date End Date Taking? Authorizing Provider  Blood Glucose Monitoring Suppl (TRUE METRIX AIR GLUCOSE METER) W/DEVICE KIT 1 Device by Does not apply route 4 (four) times daily -  before meals and at bedtime. 08/14/15   Lance Bosch, NP  glucose blood (TRUE METRIX BLOOD GLUCOSE TEST) test strip Use as instructed 03/18/17   Alfonse Spruce, FNP  glucose monitoring kit (FREESTYLE) monitoring kit 1 each by Does not apply route as needed for other. 08/14/14   Sahar Heloise Beecham, PA-C  HYDROcodone-acetaminophen (NORCO) 7.5-325 MG tablet Take 1-2 tablets by mouth every 6 (six) hours as needed for moderate pain. 02/06/16   Leandrew Koyanagi, MD  ibuprofen (ADVIL,MOTRIN) 600 MG tablet Take 1 tablet (600 mg total) by mouth every 6 (six) hours as needed. 10/17/15   Lance Bosch, NP  Insulin Glargine (LANTUS SOLOSTAR) 100 UNIT/ML Solostar Pen Inject 10 Units into the skin daily at 10 pm. 03/18/17   Alfonse Spruce, FNP  Insulin Syringe-Needle U-100 30G X 1/2" 0.5 ML MISC Use as directed 03/18/17   Alfonse Spruce, FNP  Lancets (FREESTYLE) lancets Use as instructed 09/29/14   Lance Bosch, NP  lisinopril (PRINIVIL,ZESTRIL) 2.5 MG tablet Take 1 tablet (2.5 mg total) by mouth daily. 03/20/17  Alfonse Spruce, FNP  metFORMIN (GLUCOPHAGE) 1000 MG tablet Take 1 tablet (1,000 mg total) by mouth 2 (two) times daily with a meal. 03/18/17   Alfonse Spruce, FNP  nitrofurantoin, macrocrystal-monohydrate, (MACROBID) 100 MG capsule Take 1 capsule (100 mg total) by mouth 2 (two) times daily. Take with food. 03/18/17   Alfonse Spruce, FNP  Norgestimate-Ethinyl Estradiol Triphasic (ORTHO TRI-CYCLEN, 28,) 0.18/0.215/0.25 MG-35 MCG tablet Take 1 tablet by mouth daily.    Historical Provider, MD  ondansetron (ZOFRAN) 4 MG tablet Take 1-2 tablets (4-8 mg  total) by mouth every 8 (eight) hours as needed for nausea or vomiting. 02/06/16   Leandrew Koyanagi, MD  rosuvastatin (CRESTOR) 20 MG tablet Take 1 tablet (20 mg total) by mouth daily. 03/20/17   Alfonse Spruce, FNP    Family History No family history on file.  Social History Social History  Substance Use Topics  . Smoking status: Never Smoker  . Smokeless tobacco: Never Used  . Alcohol use No     Allergies   Patient has no known allergies.   Review of Systems Review of Systems  Neurological: Positive for headaches.  Ten systems reviewed and are negative for acute change, except as noted in the HPI.    Physical Exam Updated Vital Signs BP (!) 144/87   Pulse 78   Temp 98.4 F (36.9 C) (Oral)   Resp 16   Ht _0  (1.549 m)   Wt 73 kg   LMP 02/14/2017   SpO2 100%   BMI 30.42 kg/m   Physical Exam  Constitutional: She is oriented to person, place, and time. She appears well-developed and well-nourished. No distress.  Nontoxic and in NAD  HENT:  Head: Normocephalic and atraumatic.  Mouth/Throat: Oropharynx is clear and moist.  Symmetric rise of the uvula with phonation  Eyes: Conjunctivae and EOM are normal. Pupils are equal, round, and reactive to light. No scleral icterus.  Neck: Normal range of motion.  No nuchal rigidity or meningismus  Cardiovascular: Normal rate, regular rhythm and intact distal pulses.   Pulmonary/Chest: Effort normal. No respiratory distress.  Respirations even and  unlabored  Musculoskeletal: Normal range of motion.  Neurological: She is alert and oriented to person, place, and time. No cranial nerve deficit. She exhibits normal muscle tone. Coordination normal.  GCS 15. Speech is goal oriented. No cranial nerve deficits appreciated; symmetric eyebrow raise, no facial drooping, tongue midline. Patient has equal grip strength bilaterally with 5/5 strength against resistance in all major muscle groups bilaterally. Sensation to light touch intact.  Patient moves extremities without ataxia. Patient ambulatory with steady gait.  Skin: Skin is warm and dry. No rash noted. She is not diaphoretic. No erythema. No pallor.  Psychiatric: She has a normal mood and affect. Her behavior is normal.  Nursing note and vitals reviewed.    ED Treatments / Results  Labs (all labs ordered are listed, but only abnormal results are displayed) Labs Reviewed  I-STAT CHEM 8, ED - Abnormal; Notable for the following:       Result Value   Glucose, Bld 210 (*)    Calcium, Ion 1.07 (*)    All other components within normal limits    EKG  EKG Interpretation None       Radiology No results found.  Procedures Procedures (including critical care time)  Medications Ordered in ED Medications  sodium chloride 0.9 % bolus 1,000 mL (1,000 mLs Intravenous New Bag/Given 03/23/17 0012)  ketorolac (TORADOL)  15 MG/ML injection 15 mg (15 mg Intravenous Given 03/23/17 0012)  metoCLOPramide (REGLAN) injection 10 mg (10 mg Intravenous Given 03/23/17 0012)  ketorolac (TORADOL) 15 MG/ML injection 15 mg (15 mg Intravenous Given 03/23/17 0042)     Initial Impression / Assessment and Plan / ED Course  I have reviewed the triage vital signs and the nursing notes.  Pertinent labs & imaging results that were available during my care of the patient were reviewed by me and considered in my medical decision making (see chart for details).     11:45 PM Patient presents to the ED complaining of an atraumatic headache consistent with prior migraines. Patient has a nonfocal neurologic exam. She is afebrile with reassuring vital signs. No nuchal rigidity or meningismus to suggest meningitis. Plan for symptomatic management and reassessment. Chemistry panel ordered given hx of DM.  12:42 AM Chemistry panel reviewed; reassuring. Per RN, patient states that she now needs to leave as her son is sick. She was given her initial doses of medications for migraine management. Given her  reassuring exam and lack of fever, do not see indication for further emergent workup. I believe discharge is reasonable. The patient has been instructed to follow-up with her primary care doctor should headache persist. Return precautions provided at discharge. Patient discharged in stable condition.   Final Clinical Impressions(s) / ED Diagnoses   Final diagnoses:  Bad headache    New Prescriptions New Prescriptions   No medications on file     Antonietta Breach, PA-C 03/23/17 0044    Julianne Rice, MD 03/26/17 [phone]

## 2017-03-22 NOTE — ED Triage Notes (Signed)
Pt c/o headache x 2 weeks with nausea and light sensitivity. Pt reports pain increased on right side of head past couple of days.

## 2017-03-23 MED ORDER — KETOROLAC TROMETHAMINE 15 MG/ML IJ SOLN
15.0000 mg | Freq: Once | INTRAMUSCULAR | Status: AC
  Administered 2017-03-23: 15 mg via INTRAVENOUS
  Filled 2017-03-23: qty 1

## 2017-03-23 NOTE — Discharge Instructions (Signed)
Take Excedrin migraine for headache, as needed, if symptoms persist. Follow up with your primary care doctor.

## 2017-03-24 ENCOUNTER — Ambulatory Visit: Payer: Self-pay | Attending: Family Medicine

## 2017-03-27 ENCOUNTER — Telehealth: Payer: Self-pay | Admitting: Family Medicine

## 2017-03-27 NOTE — Telephone Encounter (Signed)
Pt came in asking to get refills on the needles for her insulin pen as well as requesting for Marvetta Gibbons to call her. Please f/u.

## 2017-03-30 ENCOUNTER — Other Ambulatory Visit: Payer: Self-pay | Admitting: Family Medicine

## 2017-03-30 DIAGNOSIS — E119 Type 2 diabetes mellitus without complications: Secondary | ICD-10-CM

## 2017-03-30 MED ORDER — "INSULIN SYRINGE-NEEDLE U-100 30G X 1/2"" 0.5 ML MISC": 11 refills | Status: DC

## 2017-03-30 NOTE — Telephone Encounter (Signed)
Will refill insulin needles for patient please notify. Have you called patient to ask why she was requesting to speak with you?

## 2017-03-30 NOTE — Telephone Encounter (Signed)
[name] call patient to let her know that her RX is ready to pick up at front desk  Patient did not answer but left a VM stating that she can come & pick up her RX

## 2017-03-30 NOTE — Telephone Encounter (Signed)
Pt came in asking to get refills on the needles for her insulin pen as well as requesting for Marvetta Gibbons to call her. Please advice?Marland Kitchen

## 2017-04-01 ENCOUNTER — Ambulatory Visit: Payer: Self-pay | Attending: Family Medicine | Admitting: Pharmacist

## 2017-04-01 DIAGNOSIS — E119 Type 2 diabetes mellitus without complications: Secondary | ICD-10-CM | POA: Insufficient documentation

## 2017-04-01 DIAGNOSIS — Z794 Long term (current) use of insulin: Secondary | ICD-10-CM | POA: Insufficient documentation

## 2017-04-01 MED ORDER — METFORMIN HCL ER 500 MG PO TB24: 1000.0000 mg | ORAL_TABLET | Freq: Two times a day (BID) | ORAL | 2 refills | Status: DC

## 2017-04-01 MED ORDER — INSULIN GLARGINE 100 UNIT/ML SOLOSTAR PEN: 12.0000 [IU] | PEN_INJECTOR | Freq: Every day | SUBCUTANEOUS | 11 refills | Status: DC

## 2017-04-01 NOTE — Patient Instructions (Addendum)
Thanks for coming!  Increase Lantus to 12 units daily  Stop your metformin and start the extended release version of metformin - it should help with stomach upset.  Come back in 2 weeks   Recuento de carbohidratos para la diabetes mellitus en los adultos Carbohydrate Counting for Diabetes Mellitus, Adult El recuento de carbohidratos es un mtodo destinado a Catering manager un registro de la cantidad de carbohidratos que se ingieren. La ingesta natural de carbohidratos aumenta la cantidad de azcar (glucosa) en la sangre. El recuento de la cantidad de carbohidratos que se ingieren sirve para que el nivel de glucosa sangunea (glucemia) permanezca dentro de los lmites normales, lo que ayuda a Theatre manager la diabetes (diabetes mellitus) bajo control. Es importante saber la cantidad de carbohidratos que se pueden ingerir en cada comida sin correr Engineer, manufacturing. Esto es Psychologist, forensic. Un especialista en alimentacin y nutricin (nutricionista certificado) puede ayudarlo a crear un plan de alimentacin y a calcular la cantidad de carbohidratos que debe ingerir en cada comida y colacin. Los siguientes alimentos incluyen carbohidratos:  Granos, como panes y cereales.  Frijoles secos y productos con soja.  Verduras con almidn, como papas, guisantes y maz.  Lambert Mody y jugos de frutas.  Leche y Estate agent.  Dulces y colaciones, como pasteles, galletas, caramelos, papas fritas y refrescos. Cmo se calculan los carbohidratos? Hay dos maneras de calcular los carbohidratos de los alimentos. Puede usar cualquiera de los dos mtodos o [place] combinacin de [place]. Leer la etiqueta de informacin nutricional de los alimentos envasados  La lista de informacin nutricional est incluida en las etiquetas de casi todas las bebidas y los alimentos envasados de los [place]. Incluye lo siguiente:  El tamao de la porcin.  Informacin sobre los nutrientes de cada porcin, incluidos los gramos (g) de  carbohidratos por porcin. Para usar la informacin nutricional:  Decida cuntas porciones va a comer.  Multiplique la cantidad de porciones por el nmero de carbohidratos por porcin.  El resultado es la cantidad total de carbohidratos que comer. Conocer los tamaos de las porciones estndar de otros alimentos  Cuando coma alimentos que contienen carbohidratos que no estn envasados o no incluyen la informacin nutricional en la etiqueta, debe medir las porciones para poder calcular la cantidad de carbohidratos:  Mida los alimentos que comer con una balanza de alimentos o una taza medidora, si es necesario.  Decida cuntas porciones de International aid/development worker.  Multiplique el nmero de porciones por15. La mayora de los alimentos con alto contenido de carbohidratos contienen unos 15g de carbohidratos por porcin.  Por ejemplo, si come 8onzas (170g) de fresas, habr comido 2porciones y 30g de carbohidratos (2porciones x 15g=30g).  En el caso de las comidas que contienen mezclas de ms de un alimento, como las sopas y los guisos, debe calcular los carbohidratos de cada alimento que se incluye. La siguiente [employer] tamaos de las porciones estndar de los alimentos corrientes con alto contenido de carbohidratos. Cada una de estas porciones tiene aproximadamente 15g de carbohidratos:  pan de hamburguesa o muffin ingls.  onza (57ml) de almbar.  onza (14g) de gelatina.  1rebanada de pan.  1tortilla de seispulgadas.  3onzas (85g) de arroz o pasta cocidos.  4onzas (113g) de frijoles secos cocidos.  4onzas (113g) de verduras con almidn, como guisantes, maz o papas.  4onzas (113g) de cereal caliente.  4onzas (113g) de pur de papas o de una papa grande al horno.  4onzas (113g) de frutas en lata o  congeladas.  4onzas (118ml) de jugo de frutas.  4a 6galletas.  6croquetas de pollo.  6onzas (170g) de cereales secos sin  azcar.  6onzas (170g) de yogur descremado sin ningn agregado o de yogur endulzado con edulcorante artificial.  8onzas (263ml) de [place].  8onzas (170g) de frutas frescas o una fruta pequea.  24onzas (680g) de palomitas de maz. Ejemplo de recuento de carbohidratos Ejemplo de comida   3onzas (85g) de pechuga de pollo.  6onzas (170g) de arroz integral.  4onzas (113g) de maz.  8onzas (257ml) de leche.  8onzas (170g) de fresas con crema batida sin azcar. Clculo de carbohidratos  1. Identifique los alimentos que contienen carbohidratos:  Arroz.  Maz.  Leche.  Hughie Closs. 2. Calcule cuntas porciones come de cada alimento:  2 porciones de arroz.  1 porcin de maz.  [place].  1 porcin de fresas. 3. Multiplique cada nmero de porciones por 15g:  2 porciones de arroz x 15 g = 30 g.  1 porcin de maz x 15 g = 15 g.  1 porcin de leche x 15 g = 15 g.  1 porcin de fresas x 15 g = 15 g. 4. Sume todas las cantidades para conocer el total de gramos de carbohidratos consumidos:  30g + 15g + 15g + 15g = 75g de carbohidratos en total. Esta informacin no tiene como fin reemplazar el consejo del mdico. Asegrese de hacerle al mdico cualquier pregunta que tenga. Document Released: 02/23/2012 Document Revised: 11/18/2016 Document Reviewed: 05/14/2016 Elsevier Interactive Patient Education  2017 [employer].

## 2017-04-01 NOTE — Progress Notes (Signed)
S:  CC- no chief complain on file.  Patient arrives in good spirits.  Presents for diabetes evaluation, education, and management at the request of Fredia Beets. Patient was referred on 03/18/17.  Patient was last seen by Primary Care Provider on 03/18/17. Jan Fireman [phone]) Stratus interpretor was used for encounter.    Patient reports adherence with medications.  Current diabetes medications include: metformin 1000 mg daily, insulin glargine (Lantus) 10 Units at night  Patient denies hypoglycemic episodes. Patient reported dietary habits: eggs, tortillas, meat, vegetables, salad, NO candy, pasta, rice or bread. Patient reported exercise habits: walks 1 time per day Patient reports minimal nocturia - once per night Patient denies neuropathy. Patient denies visual changes. Patient reports self foot exams.  Patient reports feeling weak, headaches and stomach upset when she takes the metformin in the morning.   Patient brought home glucose monitor with her today.    O:    Home fasting CBG: 116-211 2 hour post-prandial/random CBG: 139-231   A1c: 8.8 (03/18/17)  A/P:  Diabetes is currently Uncontrolled due to elevated A1c and home blood glucose readings. Patient denies hypoglycemic events and is able to verbalize appropriate hypoglycemia management plan. Patient reports adherence with medication. Discontinue metformin 1000 mg BID and initiate metformin XR 1000 mg BID due to patient reported side effects. Increase Lantus to 12 Units in the evening. Patient was encouraged to continue monitoring home blood glucose readings and instructed to check numbers after she eats more often. Patient was instructed to bring monitor back to next visit to track progress.    Next A1C anticipated July 2018.     Written patient instructions provided.  Total time in face to face counseling 20 minutes.   Follow up Clinic Visit with Theda Sers in 2 weeks.   Patient seen with Maryan Char, PharmD  Candidate

## 2017-04-21 ENCOUNTER — Ambulatory Visit: Payer: Self-pay | Attending: Family Medicine | Admitting: Pharmacist

## 2017-04-21 DIAGNOSIS — E119 Type 2 diabetes mellitus without complications: Secondary | ICD-10-CM | POA: Insufficient documentation

## 2017-04-21 DIAGNOSIS — Z794 Long term (current) use of insulin: Secondary | ICD-10-CM | POA: Insufficient documentation

## 2017-04-21 MED ORDER — INSULIN GLARGINE 100 UNIT/ML SOLOSTAR PEN: 15.0000 [IU] | PEN_INJECTOR | Freq: Every day | SUBCUTANEOUS | 11 refills | Status: DC

## 2017-04-21 NOTE — Patient Instructions (Addendum)
Thanks for coming to see [place]!  Increase Lantus to 15 units daily  Follow up with [facility] in 1 month or sooner if you have any readings less than 70  Hipoglucemia (Hypoglycemia) La hipoglucemia se produce cuando el nivel de azcar (glucosa) en la sangre es demasiado bajo. Los sntomas de la glucemia baja pueden incluir los siguientes:  Sentir que tiene lo siguiente:  Apetito.  Preocupacin o nervios (ansioso).  Sudoracin y piel hmeda.  Confusin.  Mareos.  Somnolencia.  Nuseas.  Tener lo siguiente:  Latidos cardacos acelerados.  Dolor de [place].  Cambios en la visin.  Una crisis de movimientos que no puede controlar (convulsiones).  Pesadillas.  Hormigueo y falta de sensibilidad (adormecimiento) alrededor de la boca, los labios o la [place].  Dificultades para hacer lo siguiente:  Hablar.  Prestar atencin (concentrarse).  Moverse (coordinacin).  Dormir.  Temblores.  Desmayos.  Molestarse con facilidad (irritabilidad). Las personas que tienen diabetes y las que no tienen la enfermedad pueden tener la glucemia baja. El nivel bajo de glucemia en la sangre puede ocurrir rpidamente y ser Engineer, maintenance (IT). Tratamiento de la glucemia baja  Generalmente, el tratamiento de la glucemia baja consiste en ingerir de inmediato un alimento o una bebida que contengan azcar. Si puede pensar con claridad y tragar de manera segura, siga la regla 15/15, que consiste en lo siguiente:  Consumir 15gramos de un hidrato de carbono de accin rpida. Algunos hidratos de carbono de accin rpida son los siguientes:  1pomo de glucosa en gel.  3comprimidos de azcar (comprimidos de glucosa).  6 a 8unidades de caramelos duros.  4onzas (162m) de jugo de frutas.  4onzas (1239m de gaseosa comn (no diettica).  Contrlese la glucemia 1513mtos despus de ingerir el hidrato de carbono.  Si el nivel de glucosa en la sangre todava es igual o menor que 63m8m  (3,9mmo2m), ingiera nuevamente 15gramos de un hidrato de carbono.  Si el nivel de glucosa en la sangre no supera los 63mg/82m3,9mmol/58mdespus de 3intentos, solicite ayuda de inmediato.  Ingiera una comida o una colacin en el transcurso de 1hora despus de que la glucemia se haya normalizado. Tratamiento de la glucemia muy baja  Si el nivel de glucosa en la sangre es igual o menor que 54mg/dl79mmol/l)60mignifica que est muy bajo (hipoglucemia grave). Esto es una emergEngineer, maintenance (IT)re hasta que los sntomas desaparezcan. Solicite atencin mdica de inmediato. Comunquese con el servicio de emergencias de su localidad (911 en los [place]). No conduzca por sus propios medios hasta el Principal Financialivel de glucosa en la sangre muy bajo y no puede ingerir ningn alimento ni bebida, tal vez deba aplicarse una inyeccin de glucagn. Un familiar o un amigo deben aprender a controlarle la glucemia y a aplicarle una inyeccin de glucagn. Pregntele al mdico si debe tener un kit de inyecciones de glucagn en su casa. CUIDADOS EN EL HOGAR Instrucciones generales  Evite cualquier dieta que le impida ingerir la cantidad suficiente de comida. Hable con el mdico antes de comenzar una dieta nueva.  Tome los medicamentos de venta libre y los recetados solamente como se lo haya indicado el mdico.  Limite el consumo de alcohol a no ms de 1medida p71mda si es mujer y no est embarazadaSan Marinodas p71mda si es hombre. Una medida equivale a 12onzas de cerveza, 5onzas de vino o 1onzas de bebidas alcohlicas de alta graduacin.  Concurra a todas las visitas de control como se lo  haya indicado el mdico. Esto es importante. Si usted tiene diabetes:  Asegrese de [employer] de la hipoglucemia.  Siempre tenga a mano una fuente de azcar, como por ejemplo:  Azcar.  Comprimidos de azcar.  Gel de glucosa.  Jugo de frutas.  Gaseosa comn (gaseosa que no sea  diettica).  Leche.  Caramelos duros.  Miel.  Tome los medicamentos segn las indicaciones.  Siga el plan de ejercicios y de alimentacin.  Coma a horario. No omita comidas.  Siga el plan para los das de enfermedad cuando no pueda comer o beber normalmente. Arme este plan de antemano con el mdico.  Contrlese la glucemia con la frecuencia que le haya indicado el mdico. Contrlesela siempre antes y despus de hacer actividad fsica.  Comparta su plan de atencin de la diabetes con estas personas:  Compaeros de trabajo o de la escuela.  Aquellas con las que [place].  Hgase anlisis de orina para Product manager presencia de cetonas:  Cuando est enfermo.  Como se lo haya indicado el mdico.  Lleve consigo una tarjeta, o use un brazalete o una medalla que indiquen que es diabtico. Si tiene un nivel bajo de glucosa en la sangre debido a otras causas:  Contrlese la glucemia con la frecuencia que le haya indicado el mdico.  Siga las indicaciones del mdico respecto de lo que no puede comer o beber. SOLICITE AYUDA SI:  Tiene problemas para mantener el nivel de glucosa en la sangre dentro del rango indicado.  Tiene la glucemia baja con frecuencia. SOLICITE AYUDA DE INMEDIATO SI:  Contina teniendo sntomas despus de haber comido o ingerido algo con azcar.  La glucemia es igual o inferior a 89m/dl (39ml/dl).  Tiene una crisis de movimientos que no puIT consultant Se desmaya. Estos sntomas pueden inSales executiveNo espere hasta que los sntomas desaparezcan. Solicite atencin mdica de inmediato. Comunquese con el servicio de emergencias de su localidad (911 en los [place]). No conduzca por sus propios medios haPrincipal FinancialEsta informacin no tiene coMarine scientistl consejo del mdico. Asegrese de hacerle al mdico cualquier pregunta que tenga. Document Released: 01/03/2011 Document Revised: 03/24/2016 Document Reviewed:  01/04/2016 Elsevier Interactive Patient Education  2017 [employer]

## 2017-04-21 NOTE — Progress Notes (Signed)
S:  CC- no chief complain on file.  Patient arrives in good spirits.  Presents for diabetes evaluation, education, and management at the request of Fredia Beets. Patient was referred on 03/18/17.  Patient was last seen by Primary Care Provider on 03/18/17. Mickel Baas [phone]) Stratus interpreter was used for encounter.    Patient reports adherence with medications.  Current diabetes medications include: metformin 1000 XR mg daily, insulin glargine (Lantus) 12 Units at night  Patient denies hypoglycemic episodes. Patient reported dietary habits: eggs, tortillas, meat, vegetables, salad, NO candy, pasta, rice or bread. Patient reported exercise habits: walks 1 time per day Patient reports minimal nocturia - once per night Patient denies neuropathy. Patient denies visual changes. Patient reports self foot exams.  Patient reports resolution of GI upset since switching to XL metformin.   Patient brought home glucose monitor with her today.    O:    Home fasting CBG: 130s-150s 2 hour post-prandial/random CBG:    Lab Results  Component Value Date   HGBA1C 8.8 03/18/2017     A/P:  Diabetes is currently Uncontrolled due to elevated A1c of 8.8 and home blood glucose readings. Patient denies hypoglycemic events and is able to verbalize appropriate hypoglycemia management plan. Patient reports adherence with medication. Continue metformin 1000 mg XL BID.  Increase Lantus to 15 Units in the evening. Patient was encouraged to continue monitoring home blood glucose readings and instructed to check numbers after she eats more often. Patient was instructed to bring monitor back to next visit to track progress. Next A1C anticipated July 2018.     Written patient instructions provided.  Total time in face to face counseling 20 minutes.   Follow up Clinic Visit with PCP in 4 weeks or sooner if she has any readings <70.   Patient seen with Sallyanne Havers, PharmD Candidate

## 2017-04-22 ENCOUNTER — Other Ambulatory Visit: Payer: Self-pay | Admitting: Pharmacist

## 2017-04-22 MED ORDER — INSULIN DETEMIR 100 UNIT/ML FLEXPEN: 15.0000 [IU] | PEN_INJECTOR | Freq: Every day | SUBCUTANEOUS | 2 refills | Status: DC

## 2017-08-19 ENCOUNTER — Ambulatory Visit: Payer: Self-pay

## 2018-02-17 ENCOUNTER — Ambulatory Visit: Payer: Self-pay | Admitting: Nurse Practitioner

## 2018-08-23 ENCOUNTER — Encounter: Payer: Self-pay | Admitting: Nurse Practitioner

## 2018-08-23 ENCOUNTER — Ambulatory Visit: Payer: Self-pay | Attending: Nurse Practitioner | Admitting: Nurse Practitioner

## 2018-08-23 VITALS — BP 113/77 | HR 80 | Temp 98.8°F | Ht 61.0 in | Wt 146.0 lb

## 2018-08-23 DIAGNOSIS — E1169 Type 2 diabetes mellitus with other specified complication: Secondary | ICD-10-CM

## 2018-08-23 DIAGNOSIS — E785 Hyperlipidemia, unspecified: Secondary | ICD-10-CM

## 2018-08-23 DIAGNOSIS — Z7689 Persons encountering health services in other specified circumstances: Secondary | ICD-10-CM | POA: Insufficient documentation

## 2018-08-23 DIAGNOSIS — Z30011 Encounter for initial prescription of contraceptive pills: Secondary | ICD-10-CM

## 2018-08-23 DIAGNOSIS — Z79899 Other long term (current) drug therapy: Secondary | ICD-10-CM | POA: Insufficient documentation

## 2018-08-23 DIAGNOSIS — E119 Type 2 diabetes mellitus without complications: Secondary | ICD-10-CM

## 2018-08-23 DIAGNOSIS — Z794 Long term (current) use of insulin: Secondary | ICD-10-CM | POA: Insufficient documentation

## 2018-08-23 DIAGNOSIS — Z308 Encounter for other contraceptive management: Secondary | ICD-10-CM | POA: Insufficient documentation

## 2018-08-23 LAB — POCT GLYCOSYLATED HEMOGLOBIN (HGB A1C): HEMOGLOBIN A1C: 6.5 % — AB (ref 4.0–5.6)

## 2018-08-23 LAB — POCT URINE PREGNANCY: Preg Test, Ur: NEGATIVE

## 2018-08-23 LAB — GLUCOSE, POCT (MANUAL RESULT ENTRY): POC GLUCOSE: 130 mg/dL — AB (ref 70–99)

## 2018-08-23 MED ORDER — METFORMIN HCL ER 500 MG PO TB24: 1000.0000 mg | ORAL_TABLET | Freq: Two times a day (BID) | ORAL | 2 refills | Status: DC

## 2018-08-23 MED ORDER — TRUE METRIX METER W/DEVICE KIT: PACK | 0 refills | Status: DC

## 2018-08-23 MED ORDER — TRUEPLUS LANCETS 28G MISC: 3 refills | Status: DC

## 2018-08-23 MED ORDER — ROSUVASTATIN CALCIUM 10 MG PO TABS: 20.0000 mg | ORAL_TABLET | Freq: Every day | ORAL | 1 refills | Status: DC

## 2018-08-23 MED ORDER — GLUCOSE BLOOD VI STRP: ORAL_STRIP | 12 refills | Status: DC

## 2018-08-23 MED ORDER — NORGESTIM-ETH ESTRAD TRIPHASIC 0.18/0.215/0.25 MG-35 MCG PO TABS: 1.0000 | ORAL_TABLET | Freq: Every day | ORAL | 11 refills | Status: DC

## 2018-08-23 MED ORDER — ROSUVASTATIN CALCIUM 10 MG PO TABS: 10.0000 mg | ORAL_TABLET | Freq: Every day | ORAL | 1 refills | Status: DC

## 2018-08-23 MED ORDER — NORGESTIM-ETH ESTRAD TRIPHASIC 0.18/0.215/0.25 MG-35 MCG PO TABS: 1.0000 | ORAL_TABLET | Freq: Every day | ORAL | 3 refills | Status: DC

## 2018-08-23 MED ORDER — LISINOPRIL 2.5 MG PO TABS: 2.5000 mg | ORAL_TABLET | Freq: Every day | ORAL | 0 refills | Status: DC

## 2018-08-23 NOTE — Patient Instructions (Signed)

## 2018-08-23 NOTE — Progress Notes (Signed)
Assessment & Plan:  [name] was seen today for establish care.  Diagnoses and all orders for this visit:  Type 2 diabetes mellitus without complication, unspecified whether long term insulin use (HCC) -     Glucose (CBG) -     HgB A1c -     CBC -     CMP14+EGFR -     Lipid panel -     Microalbumin / creatinine urine ratio -     metFORMIN (GLUCOPHAGE XR) 500 MG 24 hr tablet; Take 2 tablets (1,000 mg total) by mouth 2 (two) times daily with a meal. -     lisinopril (PRINIVIL,ZESTRIL) 2.5 MG tablet; Take 1 tablet (2.5 mg total) by mouth daily. -     Blood Glucose Monitoring Suppl (TRUE METRIX METER) w/Device KIT; Use as instructed. Check blood glucose levels by fingerstick once per day. -     TRUEPLUS LANCETS 28G MISC; Use as instructed. Check blood glucose levels by fingerstick once per day. -     glucose blood (TRUE METRIX BLOOD GLUCOSE TEST) test strip; Use as instructed. Check blood glucose levels by fingerstick once per day. Her current meter is over 75 years old. She has been instructed to monitor her blood glucose levels at least 3 times per week. Fasting and postprandial.  Hyperlipidemia due to type 2 diabetes mellitus (HCC) -     rosuvastatin (CRESTOR) 10 MG tablet; Take 1 tablet (10 mg total) by mouth daily. INSTRUCTIONS: Work on a low fat, heart healthy diet and participate in regular aerobic exercise program by working out at least 150 minutes per week; 5 days a week-30 minutes per day. Avoid red meat, fried foods. junk foods, sodas, sugary drinks, unhealthy snacking, alcohol and smoking.  Drink at least 48oz of water per day and monitor your carbohydrate intake daily.    Encounter for prescription of oral contraceptives -     POCT urine pregnancy -     Norgestimate-Ethinyl Estradiol Triphasic (ORTHO TRI-CYCLEN, 28,) 0.18/0.215/0.25 MG-35 MCG tablet; Take 1 tablet by mouth daily.  Other orders -     Discontinue: Norgestimate-Ethinyl Estradiol Triphasic (ORTHO TRI-CYCLEN, 28,)  0.18/0.215/0.25 MG-35 MCG tablet; Take 1 tablet by mouth daily.    Patient has been counseled on age-appropriate routine health concerns for screening and prevention. These are reviewed and up-to-date. Referrals have been placed accordingly. Immunizations are up-to-date or declined.    Subjective:   Chief Complaint  Patient presents with  . Establish Care    Pt. is here to establish care for diabetes. Pt. stated she is out of her medication and would like to PCP to prescribe birth control.    HPI [name] 37 y.o. female presents to office today to establish care. She has a history of DM Type 2 however has not taken any oral diabetic agents or insulin in several months.   Type 2 Diabetes Mellitus Chronic. Improved. A1c down from 8.8 to 6.5 with diet control and intentional weight loss of 20 lbs.  There are no hypoglycemic symptoms. There are no hypoglycemic complications. Symptoms are stable. There are diabetic complications. Risk factors for coronary artery disease include family history, dyslipidemia, diabetes mellitus. Current diabetic treatment includes NONE. Weight is  stable. Patient follows a generally healthy diet. Meal planning includes avoidance of concentrated sweets. Patient has not seen a dietician. Patient is not compliant with exercise however she anticipates starting back at the gym this week.   An ACE inhibitor/angiotensin II receptor blocker is being taken.  Patient does not see a podiatrist although she would like to see a podiatrist for toenail fungus. Patient has been advised to apply for financial assistance and schedule to see our financial counselor.  Eye exam is not current.  Lab Results  Component Value Date   HGBA1C 6.5 (A) 08/23/2018    Hyperlipidemia Patient presents for follow up to hyperlipidemia.  She is not medication compliant. She has been out of statin for several months.  She is diet compliant and denies chest pain, dyspnea, exertional chest  pressure/discomfort, lower extremity edema, palpitations, poor exercise tolerance and skin xanthelasma or statin intolerance including myalgias. LDL at goal.  Lab Results  Component Value Date   CHOL 192 03/18/2017   Lab Results  Component Value Date   HDL 49 03/18/2017   Lab Results  Component Value Date   LDLCALC 75 03/18/2017   Lab Results  Component Value Date   TRIG 340 (H) 03/18/2017   Lab Results  Component Value Date   CHOLHDL 3.9 03/18/2017   Contraception Counseling: Patient presents for contraception counseling. She is requesting a refill of ortho tricyclen. She reports not taking the placebo pills in the packs. Instead she starts another pack of Ortho Tri cyclen on the 22nd day so that she does not have her period. She takes 3 packs of pills like this consecutively then on the 22nd day of the last pack of pills she does not take anything. She then gets her menstrual cycle. She states she started taking her pills this way because she was experiencing headaches with taking the placebo pills every month. She is aware this is not the correct way to take this type of birth control but tells me she has been doing this for 13 years and it helps prevent her from having monthly headaches. Her menstrual cycle on comes 3-4 times per year taking her OCPs this way and she no longer has headaches.  The patient has no complaints today. The patient is sexually active. Pertinent past medical history: none.   Review of Systems  Constitutional: Negative for fever, malaise/fatigue and weight loss.  HENT: Negative.  Negative for nosebleeds.   Eyes: Negative.  Negative for blurred vision, double vision and photophobia.  Respiratory: Negative.  Negative for cough and shortness of breath.   Cardiovascular: Negative.  Negative for chest pain, palpitations and leg swelling.  Gastrointestinal: Negative.  Negative for heartburn, nausea and vomiting.  Musculoskeletal: Negative.  Negative for myalgias.    Neurological: Negative.  Negative for dizziness, focal weakness, seizures and headaches.  Psychiatric/Behavioral: Negative.  Negative for suicidal ideas.    Past Medical History:  Diagnosis Date  . Carpal tunnel syndrome on both sides 01/2016  . Headache    "seasonal"  . Non-insulin dependent type 2 diabetes mellitus ([place])    states only uses Insulin as needed    Past Surgical History:  Procedure Laterality Date  . BILATERAL CARPAL TUNNEL RELEASE Bilateral 02/06/2016   Procedure: BILATERAL CARPAL TUNNEL RELEASE;  Surgeon: Leandrew Koyanagi, MD;  Location: [facility];  Service: Orthopedics;  Laterality: Bilateral;  . NO PAST SURGERIES      Family History  Problem Relation Age of Onset  . Diabetes Neg Hx   . Hypertension Neg Hx   . Hyperlipidemia Neg Hx     Social History Reviewed with no changes to be made today.   Outpatient Medications Prior to Visit  Medication Sig Dispense Refill  . Blood Glucose Monitoring Suppl (TRUE METRIX  AIR GLUCOSE METER) W/DEVICE KIT 1 Device by Does not apply route 4 (four) times daily -  before meals and at bedtime. 1 kit 0  . glucose blood (TRUE METRIX BLOOD GLUCOSE TEST) test strip Use as instructed 100 each 12  . Lancets (FREESTYLE) lancets Use as instructed 100 each 12  . glucose monitoring kit (FREESTYLE) monitoring kit 1 each by Does not apply route as needed for other. (Patient not taking: Reported on 08/23/2018) 1 each 0  . Insulin Detemir (LEVEMIR FLEXPEN) 100 UNIT/ML Pen Inject 15 Units into the skin daily at 10 pm. (Patient not taking: Reported on 08/23/2018) 15 mL 2  . Insulin Syringe-Needle U-100 30G X 1/2" 0.5 ML MISC Use as directed (Patient not taking: Reported on 08/23/2018) 100 each 11  . lisinopril (PRINIVIL,ZESTRIL) 2.5 MG tablet Take 1 tablet (2.5 mg total) by mouth daily. (Patient not taking: Reported on 08/23/2018) 90 tablet 0  . metFORMIN (GLUCOPHAGE XR) 500 MG 24 hr tablet Take 2 tablets (1,000 mg total) by mouth 2 (two)  times daily with a meal. (Patient not taking: Reported on 08/23/2018) 120 tablet 2  . Norgestimate-Ethinyl Estradiol Triphasic (ORTHO TRI-CYCLEN, 28,) 0.18/0.215/0.25 MG-35 MCG tablet Take 1 tablet by mouth daily.    . rosuvastatin (CRESTOR) 20 MG tablet Take 1 tablet (20 mg total) by mouth daily. (Patient not taking: Reported on 08/23/2018) 90 tablet 0   No facility-administered medications prior to visit.     No Known Allergies     Objective:    BP 113/77 (BP Location: Left Arm, Patient Position: Sitting, Cuff Size: Normal)   Pulse 80   Temp 98.8 F (37.1 C) (Oral)   Ht 5' 1"  (1.549 m)   Wt 146 lb (66.2 kg)   LMP 08/08/2018   SpO2 98%   BMI 27.59 kg/m  Wt Readings from Last 3 Encounters:  08/23/18 146 lb (66.2 kg)  03/22/17 161 lb (73 kg)  03/18/17 161 lb 12.8 oz (73.4 kg)    Physical Exam  Constitutional: She is oriented to person, place, and time. She appears well-developed and well-nourished. She is cooperative.  HENT:  Head: Normocephalic and atraumatic.  Eyes: EOM are normal.  Neck: Normal range of motion.  Cardiovascular: Normal rate, regular rhythm and normal heart sounds. Exam reveals no gallop and no friction rub.  No murmur heard. Pulmonary/Chest: Effort normal and breath sounds normal. No tachypnea. No respiratory distress. She has no decreased breath sounds. She has no wheezes. She has no rhonchi. She has no rales. She exhibits no tenderness.  Abdominal: Bowel sounds are normal.  Musculoskeletal: Normal range of motion. She exhibits no edema.  Neurological: She is alert and oriented to person, place, and time. Coordination normal.  Skin: Skin is warm and dry.  Psychiatric: She has a normal mood and affect. Her behavior is normal. Judgment and thought content normal.  Nursing note and vitals reviewed.      Patient has been counseled extensively about nutrition and exercise as well as the importance of adherence with medications and regular follow-up. The patient  was given clear instructions to go to ER or return to medical center if symptoms don't improve, worsen or new problems develop. The patient verbalized understanding.   Follow-up: Return for PAP SMEAR.   [name], FNP-BC [facility] and [place], [place]   08/23/2018, 12:31 PM

## 2018-08-24 ENCOUNTER — Telehealth: Payer: Self-pay

## 2018-08-24 LAB — CMP14+EGFR
A/G RATIO: 1.4 (ref 1.2–2.2)
ALT: 8 IU/L (ref 0–32)
AST: 12 IU/L (ref 0–40)
Albumin: 4.5 g/dL (ref 3.5–5.5)
Alkaline Phosphatase: 103 IU/L (ref 39–117)
BUN/Creatinine Ratio: 13 (ref 9–23)
BUN: 9 mg/dL (ref 6–20)
Bilirubin Total: 0.3 mg/dL (ref 0.0–1.2)
CALCIUM: 9.6 mg/dL (ref 8.7–10.2)
CHLORIDE: 104 mmol/L (ref 96–106)
CO2: 20 mmol/L (ref 20–29)
Creatinine, Ser: 0.72 mg/dL (ref 0.57–1.00)
GFR, EST AFRICAN AMERICAN: 125 mL/min/{1.73_m2} (ref >59)
GFR, EST NON AFRICAN AMERICAN: 108 mL/min/{1.73_m2} (ref >59)
GLUCOSE: 131 mg/dL — AB (ref 65–99)
Globulin, Total: 3.3 g/dL (ref 1.5–4.5)
POTASSIUM: 4 mmol/L (ref 3.5–5.2)
Sodium: 140 mmol/L (ref 134–144)
TOTAL PROTEIN: 7.8 g/dL (ref 6.0–8.5)

## 2018-08-24 LAB — LIPID PANEL
CHOL/HDL RATIO: 3.6 ratio (ref 0.0–4.4)
Cholesterol, Total: 182 mg/dL (ref 100–199)
HDL: 51 mg/dL (ref >39)
LDL Calculated: 105 mg/dL — ABNORMAL HIGH (ref 0–99)
Triglycerides: 130 mg/dL (ref 0–149)
VLDL CHOLESTEROL CAL: 26 mg/dL (ref 5–40)

## 2018-08-24 LAB — CBC
Hematocrit: 34.4 % (ref 34.0–46.6)
Hemoglobin: 10.7 g/dL — ABNORMAL LOW (ref 11.1–15.9)
MCH: 23.9 pg — ABNORMAL LOW (ref 26.6–33.0)
MCHC: 31.1 g/dL — ABNORMAL LOW (ref 31.5–35.7)
MCV: 77 fL — AB (ref 79–97)
PLATELETS: 362 10*3/uL (ref 150–450)
RBC: 4.48 x10E6/uL (ref 3.77–5.28)
RDW: 15.2 % (ref 12.3–15.4)
WBC: 8.9 10*3/uL (ref 3.4–10.8)

## 2018-08-24 LAB — MICROALBUMIN / CREATININE URINE RATIO
Creatinine, Urine: 128.4 mg/dL
Microalb/Creat Ratio: 240 mg/g creat — ABNORMAL HIGH (ref 0.0–30.0)
Microalbumin, Urine: 308.1 ug/mL

## 2018-08-24 NOTE — Telephone Encounter (Signed)
-----   Message from [name], NP sent at 08/24/2018 10:30 AM EDT ----- Labs are essentially normal. There are some minor variations in your blood work that do not require any additional work up at this time. Will continue to monitor. Make sure you are drinking at least 48 oz of water per day. Work on eating a low fat, heart healthy diet and participate in regular aerobic exercise program to control as well. Exercise at least  30 minutes per day-5 days per week. Avoid red meat. No fried foods. No junk foods, sodas, sugary foods or drinks, unhealthy snacking, alcohol or smoking.   Continue cholesterol medication as prescribed.

## 2018-08-24 NOTE — Telephone Encounter (Signed)
[name] spoke to patient to inform on lab results advising.   Patient verified DOB. Patient understood.   Spanish interpreter Deiermo [phone] assist with the call.

## 2018-08-26 ENCOUNTER — Telehealth: Payer: Self-pay

## 2018-08-26 NOTE — Telephone Encounter (Signed)
-----   Message from [name], NP sent at 08/24/2018  5:51 PM EDT ----- Urine shows microscopic diabetic kidney damage. Please make sure you are taking lisinopril 2.5 mg every day as prescribed. This helps to protect your kidneys from diabetic damage

## 2018-08-26 NOTE — Telephone Encounter (Signed)
[name] spoke to patient to inform on urine results.  Patient verified DOB. Patient understood.   [place] interpreter Earnestine Mealing [phone] assist with the call.

## 2018-09-10 ENCOUNTER — Telehealth: Payer: Self-pay

## 2018-09-10 NOTE — Telephone Encounter (Signed)
Pharmacy is requesting that the birthcontrol RX be resent and include in the SIG that the patient was instructed to skip placebo/iron tabs so they give her an accurate days supply on her fills. Thanks!

## 2018-09-12 NOTE — Telephone Encounter (Signed)
I did not instruct her to take her pills this way. My note states this is how SHE takes them. Please have pharmacy refill the prescription as initially sent. Thank you

## 2018-09-13 NOTE — Telephone Encounter (Signed)
Pharmacy was informed and will call pt.

## 2018-09-15 ENCOUNTER — Encounter: Payer: Self-pay | Admitting: Nurse Practitioner

## 2018-09-15 ENCOUNTER — Ambulatory Visit: Payer: Self-pay | Attending: Nurse Practitioner | Admitting: Nurse Practitioner

## 2018-09-15 VITALS — BP 111/71 | HR 68 | Temp 99.3°F | Ht 61.0 in | Wt 143.8 lb

## 2018-09-15 DIAGNOSIS — Z01419 Encounter for gynecological examination (general) (routine) without abnormal findings: Secondary | ICD-10-CM | POA: Insufficient documentation

## 2018-09-15 DIAGNOSIS — Z124 Encounter for screening for malignant neoplasm of cervix: Secondary | ICD-10-CM

## 2018-09-15 DIAGNOSIS — E119 Type 2 diabetes mellitus without complications: Secondary | ICD-10-CM | POA: Insufficient documentation

## 2018-09-15 DIAGNOSIS — Z7984 Long term (current) use of oral hypoglycemic drugs: Secondary | ICD-10-CM | POA: Insufficient documentation

## 2018-09-15 DIAGNOSIS — Z79899 Other long term (current) drug therapy: Secondary | ICD-10-CM | POA: Insufficient documentation

## 2018-09-15 DIAGNOSIS — Z30011 Encounter for initial prescription of contraceptive pills: Secondary | ICD-10-CM

## 2018-09-15 LAB — GLUCOSE, POCT (MANUAL RESULT ENTRY): POC Glucose: 131 mg/dl — AB (ref 70–99)

## 2018-09-15 MED ORDER — NORGESTIM-ETH ESTRAD TRIPHASIC 0.18/0.215/0.25 MG-35 MCG PO TABS: 1.0000 | ORAL_TABLET | Freq: Every day | ORAL | 3 refills | Status: DC

## 2018-09-15 NOTE — Progress Notes (Signed)
Assessment & Plan:  [name] was seen today for gynecologic exam.  Diagnoses and all orders for this visit:  Encounter for Papanicolaou smear for cervical cancer screening -     Cytology - PAP  Type 2 diabetes mellitus without complication, unspecified whether long term insulin use (HCC) -     Glucose (CBG)  Encounter for prescription of oral contraceptives -     Norgestimate-Ethinyl Estradiol Triphasic (ORTHO TRI-CYCLEN, 28,) 0.18/0.215/0.25 MG-35 MCG tablet; Take 1 tablet by mouth daily.    Patient has been counseled on age-appropriate routine health concerns for screening and prevention. These are reviewed and up-to-date. Referrals have been placed accordingly. Immunizations are up-to-date or declined.    Subjective:   Chief Complaint  Patient presents with  . Gynecologic Exam    Pt. is here for a pap smear.    HPI [name] 37 y.o. female presents to office today for PAP smear.   Review of Systems  Constitutional: Negative.  Negative for chills, fever, malaise/fatigue and weight loss.  Respiratory: Negative.  Negative for cough, shortness of breath and wheezing.   Cardiovascular: Negative.  Negative for chest pain, orthopnea and leg swelling.  Gastrointestinal: Negative for abdominal pain.  Genitourinary: Negative.  Negative for flank pain.  Skin: Negative.  Negative for rash.  Psychiatric/Behavioral: Negative for suicidal ideas.    Past Medical History:  Diagnosis Date  . Carpal tunnel syndrome on both sides 01/2016  . Headache    "seasonal"  . Non-insulin dependent type 2 diabetes mellitus ([place])    states only uses Insulin as needed    Past Surgical History:  Procedure Laterality Date  . BILATERAL CARPAL TUNNEL RELEASE Bilateral 02/06/2016   Procedure: BILATERAL CARPAL TUNNEL RELEASE;  Surgeon: Leandrew Koyanagi, MD;  Location: [facility];  Service: Orthopedics;  Laterality: Bilateral;  . NO PAST SURGERIES      Family History  Problem  Relation Age of Onset  . Diabetes Neg Hx   . Hypertension Neg Hx   . Hyperlipidemia Neg Hx     Social History Reviewed with no changes to be made today.   Outpatient Medications Prior to Visit  Medication Sig Dispense Refill  . Blood Glucose Monitoring Suppl (TRUE METRIX METER) w/Device KIT Use as instructed. Check blood glucose levels by fingerstick once per day. 1 kit 0  . glucose blood (TRUE METRIX BLOOD GLUCOSE TEST) test strip Use as instructed. Check blood glucose levels by fingerstick once per day. 100 each 12  . lisinopril (PRINIVIL,ZESTRIL) 2.5 MG tablet Take 1 tablet (2.5 mg total) by mouth daily. 90 tablet 0  . metFORMIN (GLUCOPHAGE XR) 500 MG 24 hr tablet Take 2 tablets (1,000 mg total) by mouth 2 (two) times daily with a meal. 120 tablet 2  . rosuvastatin (CRESTOR) 10 MG tablet Take 1 tablet (10 mg total) by mouth daily. 90 tablet 1  . TRUEPLUS LANCETS 28G MISC Use as instructed. Check blood glucose levels by fingerstick once per day. 100 each 3  . Norgestimate-Ethinyl Estradiol Triphasic (ORTHO TRI-CYCLEN, 28,) 0.18/0.215/0.25 MG-35 MCG tablet Take 1 tablet by mouth daily. 4 Package 3   No facility-administered medications prior to visit.     No Known Allergies     Objective:    BP 111/71 (BP Location: Left Arm, Patient Position: Sitting, Cuff Size: Normal)   Pulse 68   Temp 99.3 F (37.4 C) (Oral)   Ht 5' 1" (1.549 m)   Wt 143 lb 12.8 oz (65.2 kg)  LMP 08/08/2018   SpO2 98%   BMI 27.17 kg/m  Wt Readings from Last 3 Encounters:  09/15/18 143 lb 12.8 oz (65.2 kg)  08/23/18 146 lb (66.2 kg)  03/22/17 161 lb (73 kg)    Physical Exam  Constitutional: She is oriented to person, place, and time. She appears well-developed and well-nourished.  HENT:  Head: Normocephalic.  Cardiovascular: Normal rate, regular rhythm and normal heart sounds.  Pulmonary/Chest: Effort normal and breath sounds normal.  Abdominal: Soft. Bowel sounds are normal. Hernia confirmed  negative in the right inguinal area and confirmed negative in the left inguinal area.  Genitourinary: Rectum normal and vagina normal. Rectal exam shows no external hemorrhoid. No labial fusion. There is no rash, tenderness, lesion or injury on the right labia. There is no rash, tenderness, lesion or injury on the left labia. Uterus is deviated. Uterus is not enlarged. Cervix exhibits discharge (small amount of dark blood ). Cervix exhibits no motion tenderness and no friability. Right adnexum displays no mass, no tenderness and no fullness. Left adnexum displays no mass, no tenderness and no fullness. No erythema, tenderness or bleeding in the vagina. No foreign body in the vagina. No signs of injury around the vagina. No vaginal discharge found.  Lymphadenopathy: No inguinal adenopathy noted on the right or left side.       Right: No inguinal adenopathy present.       Left: No inguinal adenopathy present.  Neurological: She is alert and oriented to person, place, and time.  Skin: Skin is warm and dry.  Psychiatric: She has a normal mood and affect. Her behavior is normal. Judgment and thought content normal.       Patient has been counseled extensively about nutrition and exercise as well as the importance of adherence with medications and regular follow-up. The patient was given clear instructions to go to ER or return to medical center if symptoms don't improve, worsen or new problems develop. The patient verbalized understanding.   Follow-up: Return if symptoms worsen or fail to improve.   [name], FNP-BC [facility] and [place], [place]   09/15/2018, 11:01 AM

## 2018-09-15 NOTE — Patient Instructions (Signed)
Prueba de Papanicolaou  (Pap Test)  ¿POR QUÉ ME DEBO REALIZAR ESTA PRUEBA?  A esta prueba también se la denomina "frotis de Pap". Es una prueba de detección que se utiliza para detectar signos de cáncer de vagina, cuello del útero y útero. La prueba también puede identificar la presencia de infección o cambios precancerosos. El médico probablemente le recomiende que se realice esta prueba en forma regular. Esta prueba puede realizarse de la siguiente manera:  · Cada 3 años, a partir de los 21 años.  · Cada 5 años, en combinación con las pruebas que se realizan para detectar la presencia del virus del papiloma humano (VPH).  · Con mayor o menor frecuencia, en función de otras enfermedades que tenga.  ¿QUÉ TIPO DE MUESTRA SE TOMA?  El médico utilizará un pequeño hisopo de algodón, una espátula de plástico o un cepillo para recolectar una muestra de células de la superficie del cuello del útero. El cuello del útero es la apertura del útero, que también se conoce como matriz. También pueden recolectarse las secreciones del cuello del útero y la vagina.  ¿CÓMO DEBO PREPARARME PARA ESTA PRUEBA?  · Tenga en cuenta en qué etapa del ciclo menstrual se encuentra. Es posible que deba reprogramar la prueba si está menstruando el día en que debe realizársela.  · Si el día en que debe realizarse la prueba tiene una infección vaginal aparente, deberá reprogramar la prueba.  · Pueden pedirle que evite tomar una ducha o baño el día de la prueba o el día anterior.  · Algunos medicamentos pueden provocar resultados anormales de la prueba, como los digitálicos y la tetraciclina. Si toma alguno de estos medicamentos, hable con su médico antes de realizarse la prueba.  ¿QUÉ SIGNIFICAN LOS RESULTADOS?  Los resultados anormales de la prueba pueden indicar diversas enfermedades. Estas pueden incluir lo siguiente:  · Cáncer. Si bien los resultados de la prueba de Papanicolaou no pueden  utilizarse para diagnosticar cáncer de cuello del útero, de vagina o de útero, pueden indicar que existe una posibilidad de presencia de cáncer. En este caso, será necesario realizar pruebas adicionales para determinar la presencia de cáncer.  · Enfermedad de transmisión sexual.  · Infecciones por hongos.  · Infección por parásitos.  · Infección por herpes.  · Una enfermedad que causa o favorece la infertilidad.  Es su responsabilidad retirar el resultado del estudio. Consulte en el laboratorio o en el departamento en el que fue realizado el estudio cuándo y cómo podrá obtener los resultados. Comuníquese con el médico si tiene preguntas sobre los resultados.  Esta información no tiene como fin reemplazar el consejo del médico. Asegúrese de hacerle al médico cualquier pregunta que tenga.  Document Released: 05/19/2008 Document Revised: 12/22/2014 Document Reviewed: 04/24/2014  Elsevier Interactive Patient Education © 2018 [employer]

## 2018-09-17 LAB — CYTOLOGY - PAP
Adequacy: ABSENT
BACTERIAL VAGINITIS: NEGATIVE
CHLAMYDIA, DNA PROBE: NEGATIVE
Candida vaginitis: NEGATIVE
Diagnosis: NEGATIVE
HPV (WINDOPATH): NOT DETECTED
NEISSERIA GONORRHEA: NEGATIVE
TRICH (WINDOWPATH): NEGATIVE

## 2018-09-20 ENCOUNTER — Telehealth: Payer: Self-pay

## 2018-09-20 ENCOUNTER — Telehealth: Payer: Self-pay | Admitting: Nurse Practitioner

## 2018-09-20 NOTE — Telephone Encounter (Signed)
Patient returned call and she is aware of her PAP Results  Thank you .

## 2018-09-20 NOTE — Telephone Encounter (Signed)
[name] attempt to reach patient to inform on results.  No answer and left a VM for patient to call back.  If patient call back, please inform:  PAP normal. Next PAP due in  years  A letter will be send out to reach patient.

## 2018-09-20 NOTE — Telephone Encounter (Signed)
-----   Message from [name], NP sent at 09/18/2018  6:02 PM EDT ----- PAP normal. Next PAP due in  years

## 2018-09-21 ENCOUNTER — Ambulatory Visit: Payer: Self-pay | Attending: Nurse Practitioner

## 2018-11-19 ENCOUNTER — Ambulatory Visit: Payer: Self-pay | Attending: Family Medicine | Admitting: Family Medicine

## 2018-11-19 ENCOUNTER — Encounter: Payer: Self-pay | Admitting: Family Medicine

## 2018-11-19 VITALS — BP 118/73 | HR 69 | Temp 98.0°F | Ht 61.0 in | Wt 142.0 lb

## 2018-11-19 DIAGNOSIS — E119 Type 2 diabetes mellitus without complications: Secondary | ICD-10-CM

## 2018-11-19 DIAGNOSIS — N3001 Acute cystitis with hematuria: Secondary | ICD-10-CM

## 2018-11-19 DIAGNOSIS — G44209 Tension-type headache, unspecified, not intractable: Secondary | ICD-10-CM

## 2018-11-19 DIAGNOSIS — Z79899 Other long term (current) drug therapy: Secondary | ICD-10-CM | POA: Insufficient documentation

## 2018-11-19 DIAGNOSIS — Z7984 Long term (current) use of oral hypoglycemic drugs: Secondary | ICD-10-CM | POA: Insufficient documentation

## 2018-11-19 LAB — POCT URINALYSIS DIP (CLINITEK)
BILIRUBIN UA: NEGATIVE mg/dL
Bilirubin, UA: NEGATIVE
Glucose, UA: NEGATIVE mg/dL
Nitrite, UA: NEGATIVE
SPEC GRAV UA: 1.01 (ref 1.010–1.025)
Urobilinogen, UA: 0.2 E.U./dL
pH, UA: 7 (ref 5.0–8.0)

## 2018-11-19 LAB — GLUCOSE, POCT (MANUAL RESULT ENTRY): POC GLUCOSE: 83 mg/dL (ref 70–99)

## 2018-11-19 LAB — POCT GLYCOSYLATED HEMOGLOBIN (HGB A1C): Hemoglobin A1C: 5.8 % — AB (ref 4.0–5.6)

## 2018-11-19 MED ORDER — SULFAMETHOXAZOLE-TRIMETHOPRIM 800-160 MG PO TABS: 1.0000 | ORAL_TABLET | Freq: Two times a day (BID) | ORAL | 0 refills | Status: DC

## 2018-11-19 MED ORDER — BUTALBITAL-APAP-CAFFEINE 50-325-40 MG PO TABS: 1.0000 | ORAL_TABLET | Freq: Four times a day (QID) | ORAL | 0 refills | Status: DC | PRN

## 2018-11-19 NOTE — Progress Notes (Signed)
Subjective:  Patient ID: [name], female    DOB: [birth year]  Age: 37 y.o. MRN: [id number]  CC: Flank Pain (RIGHT SIDE) and Headache   HPI [name] is a 37 year old female with a history of Type 2 DM (A1c 5.8) who presents today for an acute visit with a 1 week history of right flank pain, urinary frequency but denies dysuria, abdominal pain, fever. She has also had a headache for 3 weeks rated as a 7/10 in both temples and denies sinus congestion, URI symptoms, photophobia, neck pain. She has had a normal sleep pattern but endorses being stressed Diabetes is controlled on Metformin.  Past Medical History:  Diagnosis Date  . Carpal tunnel syndrome on both sides 01/2016  . Headache    "seasonal"  . Non-insulin dependent type 2 diabetes mellitus ([place])    states only uses Insulin as needed    Past Surgical History:  Procedure Laterality Date  . BILATERAL CARPAL TUNNEL RELEASE Bilateral 02/06/2016   Procedure: BILATERAL CARPAL TUNNEL RELEASE;  Surgeon: Leandrew Koyanagi, MD;  Location: [facility];  Service: Orthopedics;  Laterality: Bilateral;  . NO PAST SURGERIES      No Known Allergies   Outpatient Medications Prior to Visit  Medication Sig Dispense Refill  . Blood Glucose Monitoring Suppl (TRUE METRIX METER) w/Device KIT Use as instructed. Check blood glucose levels by fingerstick once per day. 1 kit 0  . glucose blood (TRUE METRIX BLOOD GLUCOSE TEST) test strip Use as instructed. Check blood glucose levels by fingerstick once per day. 100 each 12  . lisinopril (PRINIVIL,ZESTRIL) 2.5 MG tablet Take 1 tablet (2.5 mg total) by mouth daily. 90 tablet 0  . metFORMIN (GLUCOPHAGE XR) 500 MG 24 hr tablet Take 2 tablets (1,000 mg total) by mouth 2 (two) times daily with a meal. 120 tablet 2  . Norgestimate-Ethinyl Estradiol Triphasic (ORTHO TRI-CYCLEN, 28,) 0.18/0.215/0.25 MG-35 MCG tablet Take 1 tablet by mouth daily. 4 Package 3  . rosuvastatin (CRESTOR) 10  MG tablet Take 1 tablet (10 mg total) by mouth daily. 90 tablet 1  . TRUEPLUS LANCETS 28G MISC Use as instructed. Check blood glucose levels by fingerstick once per day. 100 each 3   No facility-administered medications prior to visit.     ROS Review of Systems  Constitutional: Negative for activity change, appetite change and fatigue.  HENT: Negative for congestion, sinus pressure and sore throat.   Eyes: Negative for visual disturbance.  Respiratory: Negative for cough, chest tightness, shortness of breath and wheezing.   Cardiovascular: Negative for chest pain and palpitations.  Gastrointestinal: Negative for abdominal distention, abdominal pain and constipation.  Endocrine: Negative for polydipsia.  Genitourinary: Positive for flank pain and frequency. Negative for dysuria.  Musculoskeletal: Negative for arthralgias and back pain.  Skin: Negative for rash.  Neurological: Positive for headaches. Negative for tremors, light-headedness and numbness.  Hematological: Does not bruise/bleed easily.  Psychiatric/Behavioral: Negative for agitation and behavioral problems.    Objective:  BP 118/73   Pulse 69   Temp 98 F (36.7 C) (Oral)   Ht _0  (1.549 m)   Wt 142 lb (64.4 kg)   SpO2 100%   BMI 26.83 kg/m   BP/Weight 11/19/2018 41/08/3789 01/19/972  Systolic BP 532 992 426  Diastolic BP 73 71 77  Wt. (Lbs) 142 143.8 146  BMI 26.83 27.17 27.59      Physical Exam  Constitutional: She is oriented to person, place, and time.  She appears well-developed and well-nourished.  HENT:  Head: Normocephalic.  Mouth/Throat: Oropharynx is clear and moist.  Eyes: Pupils are equal, round, and reactive to light. EOM are normal.  Cardiovascular: Normal rate, normal heart sounds and intact distal pulses.  No murmur heard. Pulmonary/Chest: Effort normal and breath sounds normal. She has no wheezes. She has no rales. She exhibits no tenderness.  Abdominal: Soft. Bowel sounds are normal. She  exhibits no distension and no mass. There is no tenderness.  Musculoskeletal: Normal range of motion.  +ve CVA tenderness on the right  Neurological: She is alert and oriented to person, place, and time.    Lab Results  Component Value Date   HGBA1C 5.8 (A) 11/19/2018     Assessment & Plan:   1. Type 2 diabetes mellitus without complication, unspecified whether long term insulin use (HCC) Controlled with A1c of 5.8 - POCT glucose (manual entry) - POCT glycosylated hemoglobin (Hb A1C)  2. Acute cystitis with hematuria - sulfamethoxazole-trimethoprim (BACTRIM DS,SEPTRA DS) 800-160 MG tablet; Take 1 tablet by mouth 2 (two) times daily.  Dispense: 14 tablet; Refill: 0  3. Tension headache She endorses being under stress - butalbital-acetaminophen-caffeine (FIORICET, ESGIC) 50-325-40 MG tablet; Take 1 tablet by mouth every 6 (six) hours as needed for headache.  Dispense: 40 tablet; Refill: 0   Meds ordered this encounter  Medications  . butalbital-acetaminophen-caffeine (FIORICET, ESGIC) 50-325-40 MG tablet    Sig: Take 1 tablet by mouth every 6 (six) hours as needed for headache.    Dispense:  40 tablet    Refill:  0  . sulfamethoxazole-trimethoprim (BACTRIM DS,SEPTRA DS) 800-160 MG tablet    Sig: Take 1 tablet by mouth 2 (two) times daily.    Dispense:  14 tablet    Refill:  0    Follow-up: Return for Follow-up of chronic medical conditions, keep previously scheduled appointment with PCP.   Charlott Rakes MD

## 2018-11-19 NOTE — Patient Instructions (Signed)
Infeccin urinaria en los adultos  (Urinary Tract Infection, Adult)  Una infeccin urinaria (IU) puede ocurrir en cualquier lugar de las vas urinarias. Las vas urinarias incluyen lo siguiente:   Riones.   Urteres.   Vejiga.   Uretra.  Estos rganos fabrican, almacenan y eliminan la orina del organismo.  CUIDADOS EN EL HOGAR   Tome los medicamentos de venta libre y los recetados solamente como se lo haya indicado el mdico.   Si le recetaron un antibitico, tmelo como se lo haya indicado el mdico. No deje de tomar los antibiticos aunque comience a sentirse mejor.   Evite beber lo siguiente:  ? Alcohol.  ? Cafena.  ? T.  ? Bebidas con gas.   Beba suficiente lquido para mantener el pis claro o de color amarillo plido.   Concurra a todas las visitas de control como se lo haya indicado el mdico. Esto es importante.   Asegrese de lo siguiente:  ? Vaciar la vejiga con frecuencia y en su totalidad. No contener la orina durante largos perodos.  ? Vaciar la vejiga antes y despus de tener relaciones sexuales.  ? Limpiar de adelante hacia atrs despus de defecar, si es mujer. Usar cada trozo de papel una vez cuando se limpie.  SOLICITE AYUDA SI:   Siente dolor en la espalda.   Tiene fiebre.   Siente malestar estomacal (nuseas).   Vomita.   Los sntomas no mejoran despus de 3das de tratamiento.   Los sntomas desaparecen y luego reaparecen.  SOLICITE AYUDA DE INMEDIATO SI:   Siente un dolor muy intenso en la espalda.   Siente un dolor muy intenso en la parte inferior del abdomen.   Tiene vmitos y no puede retener los medicamentos ni el agua.  Esta informacin no tiene como fin reemplazar el consejo del mdico. Asegrese de hacerle al mdico cualquier pregunta que tenga.  Document Released: 05/21/2010 Document Revised: 03/24/2016 Document Reviewed: 10/22/2015  Elsevier Interactive Patient Education  2018 Elsevier Inc.

## 2018-11-29 ENCOUNTER — Other Ambulatory Visit: Payer: Self-pay

## 2018-11-29 ENCOUNTER — Ambulatory Visit: Payer: Self-pay | Attending: Nurse Practitioner | Admitting: Nurse Practitioner

## 2018-11-29 ENCOUNTER — Encounter: Payer: Self-pay | Admitting: Nurse Practitioner

## 2018-11-29 VITALS — BP 120/80 | HR 69 | Temp 98.4°F | Resp 16 | Wt 141.6 lb

## 2018-11-29 DIAGNOSIS — E119 Type 2 diabetes mellitus without complications: Secondary | ICD-10-CM

## 2018-11-29 DIAGNOSIS — Z7984 Long term (current) use of oral hypoglycemic drugs: Secondary | ICD-10-CM | POA: Insufficient documentation

## 2018-11-29 DIAGNOSIS — G44209 Tension-type headache, unspecified, not intractable: Secondary | ICD-10-CM

## 2018-11-29 DIAGNOSIS — Z79899 Other long term (current) drug therapy: Secondary | ICD-10-CM | POA: Insufficient documentation

## 2018-11-29 LAB — GLUCOSE, POCT (MANUAL RESULT ENTRY): POC GLUCOSE: 138 mg/dL — AB (ref 70–99)

## 2018-11-29 MED ORDER — AMITRIPTYLINE HCL 25 MG PO TABS: 25.0000 mg | ORAL_TABLET | Freq: Every day | ORAL | 1 refills | Status: DC

## 2018-11-29 NOTE — Patient Instructions (Signed)
Cefalea tensional (Tension Headache) Una cefalea tensional es un dolor o presin que se siente en la frente y los lados de la cabeza. Estas cefaleas pueden durar de 30minutos a varios das. CUIDADOS EN EL HOGAR Control del dolor  Tome los medicamentos de venta libre y los recetados solamente como se lo haya indicado el mdico.  Cuando sienta dolor de cabeza acustese en un cuarto oscuro y tranquilo.  Si se lo indican, aplquese hielo en la zona de la cabeza y el cuello: ? Ponga el hielo en una bolsa plstica. ? Coloque una toalla entre la piel y la bolsa de hielo. ? Coloque el hielo durante 20minutos, 2 a 3veces por da.  Utilice una almohadilla trmica o tome una ducha caliente para aplicar calor en la zona de la cabeza y el cuello segn las indicaciones del mdico. Comida y bebida  Mantenga un horario para las comidas.  No beba mucho alcohol.  No consuma mucha cafena ni deje de consumirla por completo. Instrucciones generales  Concurra a todas las visitas de control como se lo haya indicado el mdico. Esto es importante.  Lleve un registro diario para averiguar si ciertas cosas provocan los dolores de cabeza. Por ejemplo, escriba los siguientes datos: ? Lo que usted come y bebe. ? Cunto tiempo duerme. ? Algn cambio en su dieta o en los medicamentos.  Pruebe recibir un masaje o hacer otras cosas que lo ayuden a relajarse.  Disminuya el nivel de estrs.  Sintese con la espalda recta. No contraiga (tensione) los msculos.  No consuma productos que contengan tabaco. Estos incluyen cigarrillos, tabaco para mascar y cigarrillos electrnicos. Si necesita ayuda para dejar de fumar, consulte al mdico.  Haga ejercicios con regularidad tal como se lo indic el mdico.  Duerma lo suficiente. Es decir, entre 7 y 9horas de sueo. SOLICITE AYUDA SI:  Los medicamentos no logran aliviar los sntomas.  Tiene un dolor de cabeza que es diferente del dolor de cabeza  habitual.  Tiene malestar estomacal (nuseas) o vomita.  Tiene fiebre. SOLICITE AYUDA DE INMEDIATO SI:  La cefalea es muy intensa.  Sigue vomitando.  Presenta rigidez en el cuello.  Tiene dificultad para ver.  Tiene dificultad para hablar.  Siente dolor en el ojo o en el odo.  Sus msculos estn dbiles, o pierde el control muscular.  Pierde el equilibrio o tiene problemas para caminar.  Siente que se desvanece (pierde el conocimiento) o se desmaya.  Se siente confundido. Esta informacin no tiene como fin reemplazar el consejo del mdico. Asegrese de hacerle al mdico cualquier pregunta que tenga. Document Released: 02/23/2012 Document Revised: 08/22/2015 Document Reviewed: 03/26/2015 Elsevier Interactive Patient Education  2018 Elsevier Inc.  

## 2018-11-29 NOTE — Progress Notes (Signed)
Follow up appt.  HgA1C on 11/19/2018 was 5.8 States she keeps having the headache. Does not take Fioricet.  Would like to mention birth control medication- to change  Pain- 7/10     [place]

## 2018-11-29 NOTE — Progress Notes (Signed)
Assessment & Plan:  [name] was seen today for follow-up.  Diagnoses and all orders for this visit:  Tension headache -     amitriptyline (ELAVIL) 25 MG tablet; Take 1 tablet (25 mg total) by mouth at bedtime. -     Ambulatory referral to Neurology  Type 2 diabetes mellitus without complication, unspecified whether long term insulin use (HCC) -     Glucose (CBG) Well controlled.  Continue blood sugar control as discussed in office today, low carbohydrate diet, and regular physical exercise as tolerated, 150 minutes per week (30 min each day, 5 days per week, or 50 min 3 days per week). Keep blood sugar logs with fasting goal of 90-130 mg/dl, post prandial (after you eat) less than 180.  For Hypoglycemia: BS <60 and Hyperglycemia BS >400; contact the clinic ASAP. Annual eye exams and foot exams are recommended. She currently denies any hypo or hyperglycemic symptoms and endorses medication compliance.  Lab Results  Component Value Date   HGBA1C 5.8 (A) 11/19/2018    Patient has been counseled on age-appropriate routine health concerns for screening and prevention. These are reviewed and up-to-date. Referrals have been placed accordingly. Immunizations are up-to-date or declined.    Subjective:   Chief Complaint  Patient presents with  . Follow-up   HPI [name] 37 y.o. female presents to office today for follow up of headaches. She is also requesting a new blood pressure medication.   Tension Headaches She was prescribed Fioricet 50-325-33m at her last office visit on 11-19-2018 for tension headaches. She has also tried imitrex in the past as well. She does not take any antihistamines as she denies any allergy symptoms. Today she reports the Fioricet is causing insomnia and the imitrex did not help to relieve her pain. Will refer to Neurology whom she was referred to in the past however she canceled her appointment and was never evaluated. She has a history of migraines.  Headaches are described as sharp and throbbing. Occurring mostly in the occipital area. Other symptoms include light sensitivity. She currently denies any nausea, vomiting to stroke like symptoms.  [facility] 11/29/2018 11/19/2018 09/15/2018 08/23/2018 03/18/2017  Decreased Interest 0 0 0 0 0  Down, Depressed, Hopeless 0 0 0 0 0  PHQ - 2 Score 0 0 0 0 0  Altered sleeping 2 2 0 1 1  Tired, decreased energy 0 0 0 0 1  Change in appetite 0 0 0 1 0  Feeling bad or failure about yourself  0 0 0 0 0  Trouble concentrating 0 0 0 0 0  Moving slowly or fidgety/restless 0 0 0 0 0  Suicidal thoughts 0 0 0 0 0  PHQ-9 Score 2 2 0 2 2    Review of Systems  Constitutional: Negative for fever, malaise/fatigue and weight loss.  HENT: Negative.  Negative for nosebleeds.   Eyes: Positive for photophobia. Negative for blurred vision and double vision.  Respiratory: Negative.  Negative for cough and shortness of breath.   Cardiovascular: Negative.  Negative for chest pain, palpitations and leg swelling.  Gastrointestinal: Negative.  Negative for heartburn, nausea and vomiting.  Musculoskeletal: Negative.  Negative for myalgias.  Neurological: Positive for headaches. Negative for dizziness, focal weakness and seizures.  Endo/Heme/Allergies: Positive for environmental allergies.  Psychiatric/Behavioral: Negative.  Negative for suicidal ideas.    Past Medical History:  Diagnosis Date  . Carpal tunnel syndrome on both sides 01/2016  . Headache    "  seasonal"  . Non-insulin dependent type 2 diabetes mellitus ([place])    states only uses Insulin as needed    Past Surgical History:  Procedure Laterality Date  . BILATERAL CARPAL TUNNEL RELEASE Bilateral 02/06/2016   Procedure: BILATERAL CARPAL TUNNEL RELEASE;  Surgeon: Leandrew Koyanagi, MD;  Location: [facility];  Service: Orthopedics;  Laterality: Bilateral;  . NO PAST SURGERIES      Family History  Problem Relation Age of Onset  .  Diabetes Neg Hx   . Hypertension Neg Hx   . Hyperlipidemia Neg Hx     Social History Reviewed with no changes to be made today.   Outpatient Medications Prior to Visit  Medication Sig Dispense Refill  . lisinopril (PRINIVIL,ZESTRIL) 2.5 MG tablet Take 1 tablet (2.5 mg total) by mouth daily. 90 tablet 0  . metFORMIN (GLUCOPHAGE XR) 500 MG 24 hr tablet Take 2 tablets (1,000 mg total) by mouth 2 (two) times daily with a meal. 120 tablet 2  . Norgestimate-Ethinyl Estradiol Triphasic (ORTHO TRI-CYCLEN, 28,) 0.18/0.215/0.25 MG-35 MCG tablet Take 1 tablet by mouth daily. 4 Package 3  . Blood Glucose Monitoring Suppl (TRUE METRIX METER) w/Device KIT Use as instructed. Check blood glucose levels by fingerstick once per day. 1 kit 0  . glucose blood (TRUE METRIX BLOOD GLUCOSE TEST) test strip Use as instructed. Check blood glucose levels by fingerstick once per day. 100 each 12  . rosuvastatin (CRESTOR) 10 MG tablet Take 1 tablet (10 mg total) by mouth daily. 90 tablet 1  . TRUEPLUS LANCETS 28G MISC Use as instructed. Check blood glucose levels by fingerstick once per day. 100 each 3  . butalbital-acetaminophen-caffeine (FIORICET, ESGIC) 50-325-40 MG tablet Take 1 tablet by mouth every 6 (six) hours as needed for headache. (Patient not taking: Reported on 11/29/2018) 40 tablet 0  . sulfamethoxazole-trimethoprim (BACTRIM DS,SEPTRA DS) 800-160 MG tablet Take 1 tablet by mouth 2 (two) times daily. 14 tablet 0   No facility-administered medications prior to visit.     No Known Allergies     Objective:    BP 120/80   Pulse 69   Temp 98.4 F (36.9 C) (Oral)   Resp 16   Wt 141 lb 9.6 oz (64.2 kg)   LMP 11/20/2018   SpO2 100%   BMI 26.76 kg/m  Wt Readings from Last 3 Encounters:  11/29/18 141 lb 9.6 oz (64.2 kg)  11/19/18 142 lb (64.4 kg)  09/15/18 143 lb 12.8 oz (65.2 kg)    Physical Exam Vitals signs and nursing note reviewed.  Constitutional:      Appearance: She is well-developed.    HENT:     Head: Normocephalic and atraumatic.  Neck:     Musculoskeletal: Normal range of motion.  Cardiovascular:     Rate and Rhythm: Normal rate and regular rhythm.     Heart sounds: Normal heart sounds. No murmur. No friction rub. No gallop.   Pulmonary:     Effort: Pulmonary effort is normal. No tachypnea or respiratory distress.     Breath sounds: Normal breath sounds. No decreased breath sounds, wheezing, rhonchi or rales.  Chest:     Chest wall: No tenderness.  Abdominal:     General: Bowel sounds are normal.     Palpations: Abdomen is soft.  Musculoskeletal: Normal range of motion.  Skin:    General: Skin is warm and dry.  Neurological:     Mental Status: She is alert and oriented to person, place, and time.  GCS: GCS eye subscore is 4. GCS verbal subscore is 5. GCS motor subscore is 6.     Cranial Nerves: Cranial nerves are intact. No facial asymmetry.     Sensory: Sensation is intact.     Motor: Motor function is intact.     Coordination: Coordination is intact. Coordination normal.  Psychiatric:        Behavior: Behavior normal. Behavior is cooperative.        Thought Content: Thought content normal.        Judgment: Judgment normal.         Patient has been counseled extensively about nutrition and exercise as well as the importance of adherence with medications and regular follow-up. The patient was given clear instructions to go to ER or return to medical center if symptoms don't improve, worsen or new problems develop. The patient verbalized understanding.   Follow-up: Return in about 3 months (around 02/28/2019) for DM.   [name], FNP-BC [facility] [place] [facility] [place], [place]   11/29/2018, 5:43 PM

## 2019-02-07 ENCOUNTER — Ambulatory Visit: Payer: Self-pay | Admitting: Neurology

## 2019-03-01 ENCOUNTER — Ambulatory Visit: Payer: Self-pay | Admitting: Nurse Practitioner

## 2019-03-22 ENCOUNTER — Ambulatory Visit: Payer: Self-pay | Admitting: Neurology

## 2019-05-13 ENCOUNTER — Ambulatory Visit: Payer: Self-pay | Attending: Nurse Practitioner | Admitting: Nurse Practitioner

## 2019-05-13 ENCOUNTER — Encounter: Payer: Self-pay | Admitting: Nurse Practitioner

## 2019-05-13 DIAGNOSIS — G44209 Tension-type headache, unspecified, not intractable: Secondary | ICD-10-CM

## 2019-05-13 DIAGNOSIS — E1169 Type 2 diabetes mellitus with other specified complication: Secondary | ICD-10-CM

## 2019-05-13 DIAGNOSIS — Z789 Other specified health status: Secondary | ICD-10-CM

## 2019-05-13 DIAGNOSIS — E119 Type 2 diabetes mellitus without complications: Secondary | ICD-10-CM

## 2019-05-13 DIAGNOSIS — E785 Hyperlipidemia, unspecified: Secondary | ICD-10-CM

## 2019-05-13 MED ORDER — TRUEPLUS LANCETS 28G MISC: 3 refills | Status: DC

## 2019-05-13 MED ORDER — METFORMIN HCL ER 500 MG PO TB24: 1000.0000 mg | ORAL_TABLET | Freq: Two times a day (BID) | ORAL | 3 refills | Status: DC

## 2019-05-13 MED ORDER — ROSUVASTATIN CALCIUM 10 MG PO TABS: 10.0000 mg | ORAL_TABLET | Freq: Every day | ORAL | 1 refills | Status: DC

## 2019-05-13 MED ORDER — LISINOPRIL 2.5 MG PO TABS: 2.5000 mg | ORAL_TABLET | Freq: Every day | ORAL | 0 refills | Status: DC

## 2019-05-13 MED ORDER — AMITRIPTYLINE HCL 25 MG PO TABS: 25.0000 mg | ORAL_TABLET | Freq: Every day | ORAL | 1 refills | Status: DC

## 2019-05-13 MED ORDER — GLUCOSE BLOOD VI STRP: ORAL_STRIP | 12 refills | Status: DC

## 2019-05-13 MED ORDER — LEVONORGEST-ETH ESTRAD 91-DAY 0.15-0.03 &0.01 MG PO TABS: 1.0000 | ORAL_TABLET | Freq: Every day | ORAL | 4 refills | Status: DC

## 2019-05-13 NOTE — Progress Notes (Signed)
Virtual Visit via Telephone Note Due to national recommendations of social distancing due to [place] 19, telehealth visit is felt to be most appropriate for this patient at this time.  I discussed the limitations, risks, security and privacy concerns of performing an evaluation and management service by telephone and the availability of in person appointments. I also discussed with the patient that there may be a patient responsible charge related to this service. The patient expressed understanding and agreed to proceed.    I connected with [name] on 05/13/19  at   1:30 PM EDT  EDT by telephone and verified that I am speaking with the correct person using two identifiers.   Consent I discussed the limitations, risks, security and privacy concerns of performing an evaluation and management service by telephone and the availability of in person appointments. I also discussed with the patient that there may be a patient responsible charge related to this service. The patient expressed understanding and agreed to proceed.   Location of Patient: Private  Residence   Location of Provider: [place] [facility] [place] participating in Telemedicine visit: Geryl Rankins FNP-BC [place] Interpreter ID# [id number]   History of Present Illness: Telemedicine visit for: DM f/u and Tension Headache   DM TYPE 2 Chronic and well controlled. She has not been taking her renal dose ACE. States "just haven't been taking it". She ran out of testing supplies a few weeks ago. Prior to that her blood glucose readings were as follows: Fasting 110-120. She denies any hypo or hyperglycemic symptoms. Blood pressure is well controlled. Taking metformin 1000 mg BID. Lab Results  Component Value Date   HGBA1C 5.8 (A) 11/19/2018   BP Readings from Last 3 Encounters:  11/29/18 120/80  11/19/18 118/73  09/15/18 111/71    Mixed Hyperlipidemia LDL is  not at goal of <70. She is currently taking crestor 10mg  as prescribed.  Denies any statin intolerance or myalgias.  Lab Results  Component Value Date   LDLCALC 105 (H) 08/23/2018   Tension Headaches Well controlled with nightly elavil.    Past Medical History:  Diagnosis Date  . Carpal tunnel syndrome on both sides 01/2016  . Headache    "seasonal"  . Non-insulin dependent type 2 diabetes mellitus ([place])    states only uses Insulin as needed    Past Surgical History:  Procedure Laterality Date  . BILATERAL CARPAL TUNNEL RELEASE Bilateral 02/06/2016   Procedure: BILATERAL CARPAL TUNNEL RELEASE;  Surgeon: Leandrew Koyanagi, MD;  Location: [facility];  Service: Orthopedics;  Laterality: Bilateral;  . NO PAST SURGERIES      Family History  Problem Relation Age of Onset  . Diabetes Neg Hx   . Hypertension Neg Hx   . Hyperlipidemia Neg Hx     Social History   Socioeconomic History  . Marital status: Married    Spouse name: Not on file  . Number of children: Not on file  . Years of education: Not on file  . Highest education level: Not on file  Occupational History  . Not on file  Social Needs  . Financial resource strain: Not on file  . Food insecurity:    Worry: Not on file    Inability: Not on file  . Transportation needs:    Medical: Not on file    Non-medical: Not on file  Tobacco Use  . Smoking status: Never Smoker  .  Smokeless tobacco: Never Used  Substance and Sexual Activity  . Alcohol use: No  . Drug use: No  . Sexual activity: Yes  Lifestyle  . Physical activity:    Days per week: Not on file    Minutes per session: Not on file  . Stress: Not on file  Relationships  . Social connections:    Talks on phone: Not on file    Gets together: Not on file    Attends religious service: Not on file    Active member of club or organization: Not on file    Attends meetings of clubs or organizations: Not on file    Relationship status: Not on file   Other Topics Concern  . Not on file  Social History Narrative  . Not on file     Observations/Objective: Awake, alert and oriented x 3   ROS  Assessment and Plan: [name] was seen today for medication refill.  Diagnoses and all orders for this visit:  Type 2 diabetes mellitus without complication, unspecified whether long term insulin use (HCC) -     TRUEplus Lancets 28G MISC; Use as instructed. Check blood glucose levels by fingerstick once per day. -     metFORMIN (GLUCOPHAGE XR) 500 MG 24 hr tablet; Take 2 tablets (1,000 mg total) by mouth 2 (two) times daily with a meal for 30 days. -     lisinopril (ZESTRIL) 2.5 MG tablet; Take 1 tablet (2.5 mg total) by mouth daily. -     glucose blood (TRUE METRIX BLOOD GLUCOSE TEST) test strip; Use as instructed. Check blood glucose levels by fingerstick once per day. -     Ambulatory referral to Ophthalmology Controlled Continue medications as prescribed.  Continue blood sugar control as discussed in office today, low carbohydrate diet, and regular physical exercise as tolerated, 150 minutes per week (30 min each day, 5 days per week, or 50 min 3 days per week). Keep blood sugar logs with fasting goal of 90-130 mg/dl, post prandial (after you eat) less than 180.  For Hypoglycemia: BS <60 and Hyperglycemia BS >400; contact the clinic ASAP. Annual eye exams and foot exams are recommended.  Hyperlipidemia due to type 2 diabetes mellitus (HCC) -     rosuvastatin (CRESTOR) 10 MG tablet; Take 1 tablet (10 mg total) by mouth daily. INSTRUCTIONS: Work on a low fat, heart healthy diet and participate in regular aerobic exercise program by working out at least 150 minutes per week; 5 days a week-30 minutes per day. Avoid red meat, fried foods. junk foods, sodas, sugary drinks, unhealthy snacking, alcohol and smoking.  Drink at least 48oz of water per day and monitor your carbohydrate intake daily.   Tension headache -     amitriptyline (ELAVIL) 25 MG  tablet; Take 1 tablet (25 mg total) by mouth at bedtime for 30 days.  Oral contraceptive intolerance -     Levonorgestrel-Ethinyl Estradiol (AMETHIA) 0.15-0.03 &0.01 MG tablet; Take 1 tablet by mouth daily. Switched from ortho tricyclen to [place] due to metrorrhagia.   Follow Up Instructions Return in about 2 months (around 07/13/2019).     I discussed the assessment and treatment plan with the patient. The patient was provided an opportunity to ask questions and all were answered. The patient agreed with the plan and demonstrated an understanding of the instructions.   The patient was advised to call back or seek an in-person evaluation if the symptoms worsen or if the condition fails to improve as anticipated.  I provided 24 minutes of non-face-to-face time during this encounter including median intraservice time, reviewing previous notes, labs, imaging, medications and explaining diagnosis and management.  [name], FNP-BC

## 2019-05-21 ENCOUNTER — Encounter: Payer: Self-pay | Admitting: Nurse Practitioner

## 2019-05-23 ENCOUNTER — Telehealth: Payer: Self-pay | Admitting: Neurology

## 2019-05-23 ENCOUNTER — Ambulatory Visit: Payer: Self-pay

## 2019-05-23 NOTE — Telephone Encounter (Signed)
Due to current COVID 19 pandemic, our office is severely reducing in office visits until further notice, in order to minimize the risk to our patients and healthcare providers.   Called patient to offer virtual visit for 6/11 appt. Patient accepted and verbalized understanding of the doxy process. I have sent patient an e-mail with link and instructions, as well as my name and office number/hours. Patient understands that she will receive a call from RN prior to appt.  Pt understands that although there may be some limitations with this type of visit, we will take all precautions to reduce any security or privacy concerns.  Pt understands that this will be treated like an in office visit and we will file with pt's insurance, and there may be a patient responsible charge related to this service.

## 2019-05-25 NOTE — Telephone Encounter (Signed)
LVm for pt to call back so we could complete the pre charting for 05/26/19 visit.  Pt was advised of [facility] hrs and #.

## 2019-05-26 ENCOUNTER — Telehealth: Payer: Self-pay | Admitting: Nurse Practitioner

## 2019-05-26 ENCOUNTER — Other Ambulatory Visit: Payer: Self-pay

## 2019-05-26 ENCOUNTER — Ambulatory Visit: Payer: Self-pay | Admitting: Neurology

## 2019-05-26 ENCOUNTER — Encounter: Payer: Self-pay | Admitting: Neurology

## 2019-05-26 ENCOUNTER — Telehealth: Payer: Self-pay | Admitting: Neurology

## 2019-05-26 DIAGNOSIS — G4489 Other headache syndrome: Secondary | ICD-10-CM

## 2019-05-26 HISTORY — DX: Other headache syndrome: G44.89

## 2019-05-26 NOTE — Telephone Encounter (Signed)
This patient did not show for a new patient appointment today. 

## 2019-05-26 NOTE — Telephone Encounter (Signed)
Pt was call ed to be remind that he need to bring the application and the paperwork that he need to apply for [employer] and OC, the dateline is today

## 2019-05-26 NOTE — Progress Notes (Signed)
The patient never accessed [facility] for the video visit, I have called the telephone number, left a message, no one answers.  Therefore, this patient is no-show for a new patient visit today.

## 2019-05-30 ENCOUNTER — Telehealth: Payer: Self-pay | Admitting: Nurse Practitioner

## 2019-05-30 ENCOUNTER — Ambulatory Visit: Payer: Self-pay | Attending: Nurse Practitioner

## 2019-05-30 ENCOUNTER — Other Ambulatory Visit: Payer: Self-pay

## 2019-05-30 NOTE — Telephone Encounter (Signed)
I called Pt since had an TELE financial appt, to informed her that the appt need to be reschedule since has no application or paper submitted yet

## 2019-07-13 ENCOUNTER — Ambulatory Visit: Payer: Self-pay | Admitting: Nurse Practitioner

## 2019-07-15 ENCOUNTER — Ambulatory Visit: Payer: Self-pay | Attending: Nurse Practitioner | Admitting: Nurse Practitioner

## 2019-07-15 ENCOUNTER — Other Ambulatory Visit: Payer: Self-pay

## 2019-08-05 ENCOUNTER — Other Ambulatory Visit: Payer: Self-pay

## 2019-08-05 ENCOUNTER — Ambulatory Visit: Payer: Self-pay | Attending: Family Medicine

## 2019-11-23 ENCOUNTER — Inpatient Hospital Stay (HOSPITAL_COMMUNITY)
Admission: EM | Admit: 2019-11-23 | Discharge: 2019-11-26 | DRG: 690 | Disposition: A | Discharge status: Home / Self Care | Payer: Self-pay | Attending: Internal Medicine | Admitting: Internal Medicine

## 2019-11-23 ENCOUNTER — Other Ambulatory Visit: Payer: Self-pay

## 2019-11-23 ENCOUNTER — Encounter (HOSPITAL_COMMUNITY): Payer: Self-pay | Admitting: Emergency Medicine

## 2019-11-23 DIAGNOSIS — R591 Generalized enlarged lymph nodes: Secondary | ICD-10-CM

## 2019-11-23 DIAGNOSIS — R112 Nausea with vomiting, unspecified: Secondary | ICD-10-CM | POA: Diagnosis present

## 2019-11-23 DIAGNOSIS — N2889 Other specified disorders of kidney and ureter: Secondary | ICD-10-CM

## 2019-11-23 DIAGNOSIS — E785 Hyperlipidemia, unspecified: Secondary | ICD-10-CM | POA: Diagnosis present

## 2019-11-23 DIAGNOSIS — N200 Calculus of kidney: Secondary | ICD-10-CM

## 2019-11-23 DIAGNOSIS — Z7984 Long term (current) use of oral hypoglycemic drugs: Secondary | ICD-10-CM

## 2019-11-23 DIAGNOSIS — N12 Tubulo-interstitial nephritis, not specified as acute or chronic: Secondary | ICD-10-CM | POA: Diagnosis present

## 2019-11-23 DIAGNOSIS — E119 Type 2 diabetes mellitus without complications: Secondary | ICD-10-CM

## 2019-11-23 DIAGNOSIS — E872 Acidosis: Secondary | ICD-10-CM | POA: Diagnosis present

## 2019-11-23 DIAGNOSIS — F39 Unspecified mood [affective] disorder: Secondary | ICD-10-CM | POA: Diagnosis present

## 2019-11-23 DIAGNOSIS — G43909 Migraine, unspecified, not intractable, without status migrainosus: Secondary | ICD-10-CM | POA: Diagnosis present

## 2019-11-23 DIAGNOSIS — N179 Acute kidney failure, unspecified: Secondary | ICD-10-CM | POA: Diagnosis present

## 2019-11-23 DIAGNOSIS — Z9119 Patient's noncompliance with other medical treatment and regimen: Secondary | ICD-10-CM

## 2019-11-23 DIAGNOSIS — Z20828 Contact with and (suspected) exposure to other viral communicable diseases: Secondary | ICD-10-CM | POA: Diagnosis present

## 2019-11-23 DIAGNOSIS — I10 Essential (primary) hypertension: Secondary | ICD-10-CM | POA: Diagnosis present

## 2019-11-23 DIAGNOSIS — N10 Acute tubulo-interstitial nephritis: Principal | ICD-10-CM | POA: Diagnosis present

## 2019-11-23 HISTORY — DX: Tubulo-interstitial nephritis, not specified as acute or chronic: N12

## 2019-11-23 LAB — CBC WITH DIFFERENTIAL/PLATELET
Abs Immature Granulocytes: 0.01 10*3/uL (ref 0.00–0.07)
Basophils Absolute: 0 10*3/uL (ref 0.0–0.1)
Basophils Relative: 1 %
Eosinophils Absolute: 0 10*3/uL (ref 0.0–0.5)
Eosinophils Relative: 0 %
HCT: 40.1 % (ref 36.0–46.0)
Hemoglobin: 12.6 g/dL (ref 12.0–15.0)
Immature Granulocytes: 0 %
Lymphocytes Relative: 29 %
Lymphs Abs: 2.2 10*3/uL (ref 0.7–4.0)
MCH: 25 pg — ABNORMAL LOW (ref 26.0–34.0)
MCHC: 31.4 g/dL (ref 30.0–36.0)
MCV: 79.7 fL — ABNORMAL LOW (ref 80.0–100.0)
Monocytes Absolute: 0.3 10*3/uL (ref 0.1–1.0)
Monocytes Relative: 4 %
Neutro Abs: 5.2 10*3/uL (ref 1.7–7.7)
Neutrophils Relative %: 66 %
Platelets: 412 10*3/uL — ABNORMAL HIGH (ref 150–400)
RBC: 5.03 MIL/uL (ref 3.87–5.11)
RDW: 15.8 % — ABNORMAL HIGH (ref 11.5–15.5)
WBC: 7.8 10*3/uL (ref 4.0–10.5)
nRBC: 0 % (ref 0.0–0.2)

## 2019-11-23 LAB — URINALYSIS, ROUTINE W REFLEX MICROSCOPIC
Bilirubin Urine: NEGATIVE
Glucose, UA: NEGATIVE mg/dL
Ketones, ur: 5 mg/dL — AB
Nitrite: NEGATIVE
Protein, ur: 100 mg/dL — AB
RBC / HPF: >50 RBC/hpf — ABNORMAL HIGH (ref 0–5)
Specific Gravity, Urine: 1.015 (ref 1.005–1.030)
WBC, UA: >50 WBC/hpf — ABNORMAL HIGH (ref 0–5)
pH: 6 (ref 5.0–8.0)

## 2019-11-23 LAB — COMPREHENSIVE METABOLIC PANEL
ALT: 12 U/L (ref 0–44)
AST: 15 U/L (ref 15–41)
Albumin: 3.6 g/dL (ref 3.5–5.0)
Alkaline Phosphatase: 59 U/L (ref 38–126)
Anion gap: 11 (ref 5–15)
BUN: 8 mg/dL (ref 6–20)
CO2: 19 mmol/L — ABNORMAL LOW (ref 22–32)
Calcium: 9.3 mg/dL (ref 8.9–10.3)
Chloride: 108 mmol/L (ref 98–111)
Creatinine, Ser: 0.93 mg/dL (ref 0.44–1.00)
GFR calc Af Amer: >60 mL/min (ref >60)
GFR calc non Af Amer: >60 mL/min (ref >60)
Glucose, Bld: 142 mg/dL — ABNORMAL HIGH (ref 70–99)
Potassium: 3.5 mmol/L (ref 3.5–5.1)
Sodium: 138 mmol/L (ref 135–145)
Total Bilirubin: 0.4 mg/dL (ref 0.3–1.2)
Total Protein: 7.9 g/dL (ref 6.5–8.1)

## 2019-11-23 LAB — I-STAT BETA HCG BLOOD, ED (MC, WL, AP ONLY): I-stat hCG, quantitative: <5 m[IU]/mL (ref <5)

## 2019-11-23 NOTE — ED Triage Notes (Addendum)
Patient reports right flank pain onset last night with emesis , no hematuria or dysuria , no fever or chills . Patient added headache with lightheadedness this week .

## 2019-11-24 ENCOUNTER — Emergency Department (HOSPITAL_COMMUNITY): Payer: Self-pay

## 2019-11-24 ENCOUNTER — Ambulatory Visit: Payer: Self-pay

## 2019-11-24 ENCOUNTER — Encounter (HOSPITAL_COMMUNITY): Payer: Self-pay | Admitting: Radiology

## 2019-11-24 ENCOUNTER — Inpatient Hospital Stay (HOSPITAL_COMMUNITY): Payer: Self-pay

## 2019-11-24 DIAGNOSIS — R591 Generalized enlarged lymph nodes: Secondary | ICD-10-CM

## 2019-11-24 DIAGNOSIS — N12 Tubulo-interstitial nephritis, not specified as acute or chronic: Secondary | ICD-10-CM | POA: Diagnosis present

## 2019-11-24 DIAGNOSIS — E119 Type 2 diabetes mellitus without complications: Secondary | ICD-10-CM

## 2019-11-24 LAB — CBG MONITORING, ED
Glucose-Capillary: 89 mg/dL (ref 70–99)
Glucose-Capillary: 91 mg/dL (ref 70–99)
Glucose-Capillary: 83 mg/dL (ref 70–99)

## 2019-11-24 LAB — GLUCOSE, CAPILLARY
Glucose-Capillary: 133 mg/dL — ABNORMAL HIGH (ref 70–99)
Glucose-Capillary: 133 mg/dL — ABNORMAL HIGH (ref 70–99)

## 2019-11-24 LAB — RESPIRATORY PANEL BY RT PCR (FLU A&B, COVID)
Influenza A by PCR: NEGATIVE
Influenza B by PCR: NEGATIVE
SARS Coronavirus 2 by RT PCR: NEGATIVE

## 2019-11-24 MED ORDER — ONDANSETRON HCL 4 MG PO TABS: 4.0000 mg | ORAL_TABLET | Freq: Four times a day (QID) | ORAL | Status: DC | PRN

## 2019-11-24 MED ORDER — SODIUM CHLORIDE 0.9 % IV SOLN: 1.0000 g | Freq: Once | INTRAVENOUS | Status: DC

## 2019-11-24 MED ORDER — KETOROLAC TROMETHAMINE 60 MG/2ML IM SOLN: 60.0000 mg | Freq: Once | INTRAMUSCULAR | Status: DC

## 2019-11-24 MED ORDER — ONDANSETRON HCL 4 MG/2ML IJ SOLN
4.0000 mg | Freq: Once | INTRAMUSCULAR | Status: AC
  Administered 2019-11-24: 4 mg via INTRAVENOUS
  Filled 2019-11-24: qty 2

## 2019-11-24 MED ORDER — SODIUM CHLORIDE 0.9 % IV SOLN
2.0000 g | Freq: Once | INTRAVENOUS | Status: AC
  Administered 2019-11-24: 2 g via INTRAVENOUS
  Filled 2019-11-24: qty 20

## 2019-11-24 MED ORDER — INSULIN ASPART 100 UNIT/ML ~~LOC~~ SOLN
0.0000 [IU] | Freq: Three times a day (TID) | SUBCUTANEOUS | Status: DC
  Administered 2019-11-24: 1 [IU] via SUBCUTANEOUS

## 2019-11-24 MED ORDER — ACETAMINOPHEN 325 MG PO TABS
650.0000 mg | ORAL_TABLET | Freq: Four times a day (QID) | ORAL | Status: DC | PRN
  Administered 2019-11-24 – 2019-11-25 (×2): 650 mg via ORAL
  Filled 2019-11-24 (×2): qty 2

## 2019-11-24 MED ORDER — SODIUM CHLORIDE 0.9 % IV SOLN
INTRAVENOUS | Status: AC
  Administered 2019-11-24 (×2): via INTRAVENOUS

## 2019-11-24 MED ORDER — CEFTRIAXONE SODIUM 1 G IJ SOLR: 1.0000 g | Freq: Once | INTRAMUSCULAR | Status: DC

## 2019-11-24 MED ORDER — SODIUM CHLORIDE 0.9 % IV SOLN
2.0000 g | INTRAVENOUS | Status: DC
  Administered 2019-11-25 – 2019-11-26 (×2): 2 g via INTRAVENOUS
  Filled 2019-11-24 (×2): qty 2
  Filled 2019-11-24: qty 20

## 2019-11-24 MED ORDER — ONDANSETRON HCL 4 MG/2ML IJ SOLN
4.0000 mg | Freq: Four times a day (QID) | INTRAMUSCULAR | Status: DC | PRN
  Administered 2019-11-24 – 2019-11-25 (×2): 4 mg via INTRAVENOUS
  Filled 2019-11-24 (×2): qty 2

## 2019-11-24 MED ORDER — ACETAMINOPHEN 650 MG RE SUPP: 650.0000 mg | Freq: Four times a day (QID) | RECTAL | Status: DC | PRN

## 2019-11-24 MED ORDER — KETOROLAC TROMETHAMINE 15 MG/ML IJ SOLN: 15.0000 mg | Freq: Once | INTRAMUSCULAR | Status: DC

## 2019-11-24 MED ORDER — FENTANYL CITRATE (PF) 100 MCG/2ML IJ SOLN
100.0000 ug | Freq: Once | INTRAMUSCULAR | Status: AC
  Administered 2019-11-24: 100 ug via INTRAVENOUS
  Filled 2019-11-24: qty 2

## 2019-11-24 MED ORDER — PHENAZOPYRIDINE HCL 100 MG PO TABS: 200.0000 mg | ORAL_TABLET | Freq: Once | ORAL | Status: DC

## 2019-11-24 NOTE — ED Provider Notes (Signed)
[facility] EMERGENCY DEPARTMENT Provider Note   CSN: [id number] Arrival date & time: 11/23/19  2208     History Chief Complaint  Patient presents with   Flank Pain   Headache    [name] is a 38 y.o. female.  The history is provided by the patient.  Flank Pain This is a new problem. The current episode started 6 to 12 hours ago. The problem occurs constantly. The problem has not changed since onset.Associated symptoms include headaches. Pertinent negatives include no chest pain and no shortness of breath. Nothing aggravates the symptoms. Nothing relieves the symptoms. She has tried nothing for the symptoms. The treatment provided no relief.  Patient with DM and headaches presents with right flank pain and vomiting and headache.  No f/c/r.  No diarrhea.  No covid contacts.       Past Medical History:  Diagnosis Date   Carpal tunnel syndrome on both sides 01/2016   Headache    "seasonal"   Headache syndrome 05/26/2019   Non-insulin dependent type 2 diabetes mellitus ([place])    states only uses Insulin as needed    Patient Active Problem List   Diagnosis Date Noted   Headache syndrome 05/26/2019   DM type 2 (diabetes mellitus, type 2) ([place]) 08/02/2015   Hydronephrosis 08/10/2014   Hypokalemia 08/10/2014   Nausea and vomiting 08/10/2014   Pyelonephritis, acute 08/10/2014   Hyperglycemia 08/10/2014   DUB (dysfunctional uterine bleeding) 07/14/2014   Uterine fibroid 07/14/2014    Past Surgical History:  Procedure Laterality Date   BILATERAL CARPAL TUNNEL RELEASE Bilateral 02/06/2016   Procedure: BILATERAL CARPAL TUNNEL RELEASE;  Surgeon: Leandrew Koyanagi, MD;  Location: [facility];  Service: Orthopedics;  Laterality: Bilateral;   NO PAST SURGERIES       OB History    Gravida  5   Para  4   Term  2   Preterm  2   AB  0   Living  3     SAB      TAB      Ectopic      Multiple      Live Births   2           Family History  Problem Relation Age of Onset   Diabetes Neg Hx    Hypertension Neg Hx    Hyperlipidemia Neg Hx     Social History   Tobacco Use   Smoking status: Never Smoker   Smokeless tobacco: Never Used  Substance Use Topics   Alcohol use: No   Drug use: No    Home Medications Prior to Admission medications   Medication Sig Start Date End Date Taking? Authorizing Provider  Levonorgestrel-Ethinyl Estradiol (AMETHIA) 0.15-0.03 &0.01 MG tablet Take 1 tablet by mouth daily. 05/13/19  Yes [name], NP  amitriptyline (ELAVIL) 25 MG tablet Take 1 tablet (25 mg total) by mouth at bedtime for 30 days. Patient not taking: Reported on 11/24/2019 05/13/19 11/23/28  [name], NP  Blood Glucose Monitoring Suppl (TRUE METRIX METER) w/Device KIT Use as instructed. Check blood glucose levels by fingerstick once per day. 08/23/18   [name], NP  glucose blood (TRUE METRIX BLOOD GLUCOSE TEST) test strip Use as instructed. Check blood glucose levels by fingerstick once per day. 05/13/19   [name], NP  lisinopril (ZESTRIL) 2.5 MG tablet Take 1 tablet (2.5 mg total) by mouth daily. Patient not taking: Reported  on 11/24/2019 05/13/19   [name], NP  metFORMIN (GLUCOPHAGE XR) 500 MG 24 hr tablet Take 2 tablets (1,000 mg total) by mouth 2 (two) times daily with a meal for 30 days. Patient not taking: Reported on 11/24/2019 05/13/19 11/23/28  [name], NP  rosuvastatin (CRESTOR) 10 MG tablet Take 1 tablet (10 mg total) by mouth daily. Patient not taking: Reported on 11/24/2019 05/13/19 11/23/28  [name], NP  TRUEplus Lancets 28G MISC Use as instructed. Check blood glucose levels by fingerstick once per day. 05/13/19   [name], NP    Allergies    Patient has no known allergies.  Review of Systems   Review of Systems  Constitutional: Negative for fever.  HENT: Negative for congestion.   Eyes: Negative for visual  disturbance.  Respiratory: Negative for shortness of breath.   Cardiovascular: Negative for chest pain.  Gastrointestinal: Positive for nausea and vomiting.  Genitourinary: Positive for flank pain. Negative for dysuria.  Musculoskeletal: Negative for arthralgias.  Neurological: Positive for headaches. Negative for dizziness.  Psychiatric/Behavioral: Negative for agitation.  All other systems reviewed and are negative.   Physical Exam Updated Vital Signs BP 136/78    Pulse 65    Temp 99.1 F (37.3 C) (Oral)    Resp 18    LMP 11/17/2019    SpO2 99%   Physical Exam Vitals and nursing note reviewed.  Constitutional:      General: She is not in acute distress.    Appearance: Normal appearance.  HENT:     Head: Normocephalic and atraumatic.     Nose: Nose normal.  Eyes:     Conjunctiva/sclera: Conjunctivae normal.     Pupils: Pupils are equal, round, and reactive to light.  Cardiovascular:     Rate and Rhythm: Normal rate and regular rhythm.     Pulses: Normal pulses.     Heart sounds: Normal heart sounds.  Pulmonary:     Effort: Pulmonary effort is normal.     Breath sounds: Normal breath sounds.  Abdominal:     General: Abdomen is flat. Bowel sounds are normal.     Tenderness: There is no abdominal tenderness. There is no guarding or rebound.  Musculoskeletal:        General: Normal range of motion.     Cervical back: Normal range of motion and neck supple.  Skin:    General: Skin is warm and dry.     Capillary Refill: Capillary refill takes less than 2 seconds.  Neurological:     General: No focal deficit present.     Mental Status: She is alert and oriented to person, place, and time.     Deep Tendon Reflexes: Reflexes normal.  Psychiatric:        Mood and Affect: Mood normal.        Behavior: Behavior normal.     ED Results / Procedures / Treatments   Labs (all labs ordered are listed, but only abnormal results are displayed) Results for orders placed or  performed during the hospital encounter of 11/23/19  CBC with Differential  Result Value Ref Range   WBC 7.8 4.0 - 10.5 K/uL   RBC 5.03 3.87 - 5.11 MIL/uL   Hemoglobin 12.6 12.0 - 15.0 g/dL   HCT 40.1 36.0 - 46.0 %   MCV 79.7 (L) 80.0 - 100.0 fL   MCH 25.0 (L) 26.0 - 34.0 pg   MCHC 31.4 30.0 - 36.0 g/dL   RDW  15.8 (H) 11.5 - 15.5 %   Platelets 412 (H) 150 - 400 K/uL   nRBC 0.0 0.0 - 0.2 %   Neutrophils Relative % 66 %   Neutro Abs 5.2 1.7 - 7.7 K/uL   Lymphocytes Relative 29 %   Lymphs Abs 2.2 0.7 - 4.0 K/uL   Monocytes Relative 4 %   Monocytes Absolute 0.3 0.1 - 1.0 K/uL   Eosinophils Relative 0 %   Eosinophils Absolute 0.0 0.0 - 0.5 K/uL   Basophils Relative 1 %   Basophils Absolute 0.0 0.0 - 0.1 K/uL   Immature Granulocytes 0 %   Abs Immature Granulocytes 0.01 0.00 - 0.07 K/uL  Comprehensive metabolic panel  Result Value Ref Range   Sodium 138 135 - 145 mmol/L   Potassium 3.5 3.5 - 5.1 mmol/L   Chloride 108 98 - 111 mmol/L   CO2 19 (L) 22 - 32 mmol/L   Glucose, Bld 142 (H) 70 - 99 mg/dL   BUN 8 6 - 20 mg/dL   Creatinine, Ser 0.93 0.44 - 1.00 mg/dL   Calcium 9.3 8.9 - 10.3 mg/dL   Total Protein 7.9 6.5 - 8.1 g/dL   Albumin 3.6 3.5 - 5.0 g/dL   AST 15 15 - 41 U/L   ALT 12 0 - 44 U/L   Alkaline Phosphatase 59 38 - 126 U/L   Total Bilirubin 0.4 0.3 - 1.2 mg/dL   GFR calc non Af Amer >60 >60 mL/min   GFR calc Af Amer >60 >60 mL/min   Anion gap 11 5 - 15  Urinalysis, Routine w reflex microscopic  Result Value Ref Range   Color, Urine YELLOW YELLOW   APPearance CLOUDY (A) CLEAR   Specific Gravity, Urine 1.015 1.005 - 1.030   pH 6.0 5.0 - 8.0   Glucose, UA NEGATIVE NEGATIVE mg/dL   Hgb urine dipstick MODERATE (A) NEGATIVE   Bilirubin Urine NEGATIVE NEGATIVE   Ketones, ur 5 (A) NEGATIVE mg/dL   Protein, ur 100 (A) NEGATIVE mg/dL   Nitrite NEGATIVE NEGATIVE   Leukocytes,Ua LARGE (A) NEGATIVE   RBC / HPF >50 (H) 0 - 5 RBC/hpf   WBC, UA >50 (H) 0 - 5 WBC/hpf    Bacteria, UA FEW (A) NONE SEEN   Squamous Epithelial / LPF 0-5 0 - 5   WBC Clumps PRESENT    Mucus PRESENT   I-Stat Beta hCG blood, ED ([facility], [facility], [facility] only)  Result Value Ref Range   I-stat hCG, quantitative <5.0 <5 mIU/mL   Comment 3           CT Renal Stone Study  Result Date: 11/24/2019 CLINICAL DATA:  Right flank pain. History of kidney stones. EXAM: CT ABDOMEN AND PELVIS WITHOUT CONTRAST TECHNIQUE: Multidetector CT imaging of the abdomen and pelvis was performed following the standard protocol without IV contrast. COMPARISON:  07/21/2014 FINDINGS: Lower chest: Mild elevation of right hemidiaphragm, chronic. Lung bases are clear. Hepatobiliary: No focal liver abnormality is seen. No gallstones, gallbladder wall thickening, or biliary dilatation. Pancreas: No ductal dilatation or inflammation. Spleen: Normal in size without focal abnormality. Adrenals/Urinary Tract: Normal adrenal glands. Large staghorn calculus filling the right renal pelvis extending into the upper, mid, and lower pole calices. Exact measurements of this stone is difficult given irregular shape, however measures greater than 5 cm cranial caudal. There is mild associated calyceal dilatation. Thinning of the right renal parenchyma. High-density/blood within the proximal right ureter versus ureteral thickening, series 3, image 39. There are no  discrete ureteral calculi. Mild right perinephric edema. Multiple small nonobstructing stones in the left kidney. No left hydronephrosis. The left ureter is decompressed. Urinary bladder is partially distended. No bladder stone or wall thickening. Stomach/Bowel: Stomach is unremarkable. No small bowel obstruction or inflammation. Appendix not discretely visualized, no evidence of appendicitis. Small volume of colonic stool. No colonic inflammation. Vascular/Lymphatic: Mild aortic atherosclerosis. No aneurysm. Prominent right retroperitoneal lymph node at the level of the right kidney measuring 8 mm  short axis, series 3, image 27. Prominent aortocaval node the level of the lower kidney measures 8 mm, series 3, image 40. There is a 5 mm right retroperitoneal nodule posterior to the right psoas muscle, series 3, image 48 that may represent a lymph node. Reproductive: Uterus and bilateral adnexa are unremarkable. Other: No free air, free fluid, or intra-abdominal fluid collection. Musculoskeletal: There are no acute or suspicious osseous abnormalities. Transitional lumbosacral anatomy. IMPRESSION: 1. Large staghorn calculus in the right renal pelvis extending into the upper, mid, and lower pole calices. Mild associated calyceal dilatation and minimal perinephric stranding. 2. Soft tissue density in the right proximal ureter, indeterminate. There are multiple prominent right retroperitoneal nodes, raising suspicion for neoplasm. Recommend urology consultation for direct visualization. Blood clot is also considered but felt less likely 3. Multiple nonobstructing stones in the left kidney. Aortic Atherosclerosis (ICD10-I70.0). Electronically Signed   By: Keith Rake M.D.   On: 11/24/2019 04:05    None  Radiology CT Renal Stone Study  Result Date: 11/24/2019 CLINICAL DATA:  Right flank pain. History of kidney stones. EXAM: CT ABDOMEN AND PELVIS WITHOUT CONTRAST TECHNIQUE: Multidetector CT imaging of the abdomen and pelvis was performed following the standard protocol without IV contrast. COMPARISON:  07/21/2014 FINDINGS: Lower chest: Mild elevation of right hemidiaphragm, chronic. Lung bases are clear. Hepatobiliary: No focal liver abnormality is seen. No gallstones, gallbladder wall thickening, or biliary dilatation. Pancreas: No ductal dilatation or inflammation. Spleen: Normal in size without focal abnormality. Adrenals/Urinary Tract: Normal adrenal glands. Large staghorn calculus filling the right renal pelvis extending into the upper, mid, and lower pole calices. Exact measurements of this stone is  difficult given irregular shape, however measures greater than 5 cm cranial caudal. There is mild associated calyceal dilatation. Thinning of the right renal parenchyma. High-density/blood within the proximal right ureter versus ureteral thickening, series 3, image 39. There are no discrete ureteral calculi. Mild right perinephric edema. Multiple small nonobstructing stones in the left kidney. No left hydronephrosis. The left ureter is decompressed. Urinary bladder is partially distended. No bladder stone or wall thickening. Stomach/Bowel: Stomach is unremarkable. No small bowel obstruction or inflammation. Appendix not discretely visualized, no evidence of appendicitis. Small volume of colonic stool. No colonic inflammation. Vascular/Lymphatic: Mild aortic atherosclerosis. No aneurysm. Prominent right retroperitoneal lymph node at the level of the right kidney measuring 8 mm short axis, series 3, image 27. Prominent aortocaval node the level of the lower kidney measures 8 mm, series 3, image 40. There is a 5 mm right retroperitoneal nodule posterior to the right psoas muscle, series 3, image 48 that may represent a lymph node. Reproductive: Uterus and bilateral adnexa are unremarkable. Other: No free air, free fluid, or intra-abdominal fluid collection. Musculoskeletal: There are no acute or suspicious osseous abnormalities. Transitional lumbosacral anatomy. IMPRESSION: 1. Large staghorn calculus in the right renal pelvis extending into the upper, mid, and lower pole calices. Mild associated calyceal dilatation and minimal perinephric stranding. 2. Soft tissue density in the right proximal ureter, indeterminate.  There are multiple prominent right retroperitoneal nodes, raising suspicion for neoplasm. Recommend urology consultation for direct visualization. Blood clot is also considered but felt less likely 3. Multiple nonobstructing stones in the left kidney. Aortic Atherosclerosis (ICD10-I70.0). Electronically  Signed   By: Keith Rake M.D.   On: 11/24/2019 04:05    Procedures Procedures (including critical care time)  Medications Ordered in ED Medications  cefTRIAXone (ROCEPHIN) 1 g in sodium chloride 0.9 % 100 mL IVPB (has no administration in time range)  ketorolac (TORADOL) 15 MG/ML injection 15 mg (has no administration in time range)  fentaNYL (SUBLIMAZE) injection 100 mcg (100 mcg Intravenous Given 11/24/19 0433)  ondansetron (ZOFRAN) injection 4 mg (4 mg Intravenous Given 11/24/19 0434)    ED Course  I have reviewed the triage vital signs and the nursing notes.  Pertinent labs & imaging results that were available during my care of the patient were reviewed by me and considered in my medical decision making (see chart for details).   444 case d/w Dr. Lorra Hals of urology.  Treat pyelo, patient will need to be seen as an outpatient by urology for removal of the staghorn.  The staghorn has been there for years and is not emergent at this time.     517 case d/w Dr. Osker Mason, IR for percutaneous tissue biopsy if positive refer to oncology Final Clinical Impression(s) / ED Diagnoses Final diagnoses:  Pyelonephritis  Staghorn calculus  Lymphadenopathy    Admit to medicine    Palumbo, April, MD 11/24/19 3810

## 2019-11-24 NOTE — Progress Notes (Signed)
[facility]  PROGRESS NOTE  [name] [id number] DOB: [birth year] DOA: 11/23/2019 PCP: [name], NP Admit date - 11/23/2019   Admitting Physician Rise Patience, MD  Outpatient Primary MD for the patient is [name], NP  LOS - 0 Brief Narrative   [name] is a 38 y.o. 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  Hospital course complicated by right ureter soft tissue density with retroperitoneal lymph nodes on CT abd   Subjective   Used Video Translator for Spanish  Ms.  [name] today has persistent abdominal pain with nausea without vomiting, No headache, No chest pain, no SOB.   A & P   Acute pyelonephritis complicated by large staghorn calculus in right renal pelvis.  Hemodynamically stable, no sepsis physiology but having persistent nausea, decreased oral intake related to pain.  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 Matilde Sprang)  Right ureter soft tissue density with Retroperitoneal lymph nodes.  High concern for neoplasm or blood clot. On call your oncologist advised to get IR consult for biopsy -Consult IR for percutaneous tissue biopsy, if positive will need referral to oncology  Headache with photophobia.  Has History of migraine -CT head non acute --tylenol PRN, migraine cocktail  AKI with Non anion gap metabolic acidosis. Likely related to pyelonephritis and calculus with decrease PO intake  Baseline  creatinine 0.4-0.5. currently 0.9.  -Monitor urine output, avoid nephrotoxins, monitor BMP -Continue IV fluids, until diet intake improves  Type 2 diabetes.  With history of poor adherence, -Follow A1c, sliding scale insulin -Hold home metformin  Hypertension, at goal. --holding home lisinopril given AKI  COVID screen. Test still pending. Has no respiratory symptoms  Hyperlipidemia -Hold home Crestor until able to tolerate oral  Mood disorder -On home amitriptyline  Family Communication  :  none  Code Status :  FULL  Disposition Plan  :  Continue IVF, IR consulted, pain control, antiemetics, monitor oral intake  Consults  :  IR  Procedures  :  None currently  DVT Prophylaxis  :  SCDs   Lab Results  Component Value Date   PLT 412 (H) 11/23/2019    Diet :  Diet Order            Diet Carb Modified Fluid consistency: Thin; Room service appropriate? Yes  Diet effective now               Inpatient Medications Scheduled Meds: . insulin aspart  0-9 Units Subcutaneous TID WC   Continuous Infusions: . sodium chloride 75 mL/hr at 11/24/19 0647  . [START ON 11/25/2019] cefTRIAXone (ROCEPHIN)  IV     PRN Meds:.acetaminophen **OR** acetaminophen, ondansetron **OR** ondansetron (ZOFRAN) IV  Antibiotics  :   Anti-infectives (From admission, onward)   Start     Dose/Rate Route Frequency Ordered Stop   11/25/19 0400  cefTRIAXone (ROCEPHIN) 2 g in sodium chloride 0.9 % 100 mL IVPB     2 g 200 mL/hr over 30 Minutes Intravenous Every 24 hours 11/24/19 0617  11/24/19 0630  cefTRIAXone (ROCEPHIN) 2 g in sodium chloride 0.9 % 100 mL IVPB     2 g 200 mL/hr over 30 Minutes Intravenous  Once 11/24/19 0623 11/24/19 0717   11/24/19 0430  cefTRIAXone (ROCEPHIN) 1 g in sodium chloride 0.9 % 100 mL IVPB  Status:  Discontinued     1 g 200 mL/hr over 30 Minutes Intravenous  Once 11/24/19 0420 11/24/19 0623   11/24/19 0345  cefTRIAXone (ROCEPHIN) injection 1 g  Status:   Discontinued     1 g Intramuscular  Once 11/24/19 0340 11/24/19 0420       Objective   Vitals:   11/24/19 0203 11/24/19 0449  BP: 130/73 136/78  Pulse: 84 65  Resp: 17 18  Temp: 98.5 F (36.9 C) 99.1 F (37.3 C)  TempSrc: Oral Oral  SpO2: 100% 99%    SpO2: 99 %  Wt Readings from Last 3 Encounters:  11/29/18 64.2 kg  11/19/18 64.4 kg  09/15/18 65.2 kg    No intake or output data in the 24 hours ending 11/24/19 0730  Physical Exam:  Awake Alert, Oriented X 3, Normal affect No new F.N deficits,  [place].AT, No JVD Symmetrical Chest wall movement, Good air movement bilaterally, CTAB RRR,No Gallops,Rubs or new Murmurs,  +ve B.Sounds, Abd Soft, tenderness greatest in right lower side, + R flank CVA tenderness, no rebound tenderness or guarding.  No Cyanosis, Clubbing or edema, No new Rash or bruise    I have personally reviewed the following:   Data Reviewed:  CBC Recent Labs  Lab 11/23/19 2242  WBC 7.8  HGB 12.6  HCT 40.1  PLT 412*  MCV 79.7*  MCH 25.0*  MCHC 31.4  RDW 15.8*  LYMPHSABS 2.2  MONOABS 0.3  EOSABS 0.0  BASOSABS 0.0    Chemistries  Recent Labs  Lab 11/23/19 2242  NA 138  K 3.5  CL 108  CO2 19*  GLUCOSE 142*  BUN 8  CREATININE 0.93  CALCIUM 9.3  AST 15  ALT 12  ALKPHOS 59  BILITOT 0.4   ------------------------------------------------------------------------------------------------------------------ No results for input(s): CHOL, HDL, LDLCALC, TRIG, CHOLHDL, LDLDIRECT in the last 72 hours.  Lab Results  Component Value Date   HGBA1C 5.8 (A) 11/19/2018   ------------------------------------------------------------------------------------------------------------------ No results for input(s): TSH, T4TOTAL, T3FREE, THYROIDAB in the last 72 hours.  Invalid input(s): FREET3 ------------------------------------------------------------------------------------------------------------------ No results for input(s): VITAMINB12,  FOLATE, FERRITIN, TIBC, IRON, RETICCTPCT in the last 72 hours.  Coagulation profile No results for input(s): INR, PROTIME in the last 168 hours.  No results for input(s): DDIMER in the last 72 hours.  Cardiac Enzymes No results for input(s): CKMB, TROPONINI, MYOGLOBIN in the last 168 hours.  Invalid input(s): CK ------------------------------------------------------------------------------------------------------------------ No results found for: BNP  Micro Results Recent Results (from the past 240 hour(s))  Respiratory Panel by RT PCR (Flu A&B, Covid) - Nasopharyngeal Swab     Status: None   Collection Time: 11/24/19  4:40 AM   Specimen: Nasopharyngeal Swab  Result Value Ref Range Status   SARS Coronavirus 2 by RT PCR NEGATIVE NEGATIVE Final    Comment: (NOTE) SARS-CoV-2 target nucleic acids are NOT DETECTED. The SARS-CoV-2 RNA is generally detectable in upper respiratoy specimens during the acute phase of infection. The lowest concentration of SARS-CoV-2 viral copies this assay can detect is 131 copies/mL. A negative result does not preclude SARS-Cov-2 infection and should not be used as the sole basis for treatment or other patient management decisions. A negative result  may occur with  improper specimen collection/handling, submission of specimen other than nasopharyngeal swab, presence of viral mutation(s) within the areas targeted by this assay, and inadequate number of viral copies (<131 copies/mL). A negative result must be combined with clinical observations, patient history, and epidemiological information. The expected result is Negative. Fact Sheet for Patients:  PinkCheek.be Fact Sheet for Healthcare Providers:  GravelBags.it This test is not yet ap proved or cleared by the [place] FDA and  has been authorized for detection and/or diagnosis of SARS-CoV-2 by FDA under an Emergency Use Authorization  (EUA). This EUA will remain  in effect (meaning this test can be used) for the duration of the COVID-19 declaration under Section 564(b)(1) of the Act, 21 U.S.C. section 360bbb-3(b)(1), unless the authorization is terminated or revoked sooner.    Influenza A by PCR NEGATIVE NEGATIVE Final   Influenza B by PCR NEGATIVE NEGATIVE Final    Comment: (NOTE) The Xpert Xpress SARS-CoV-2/FLU/RSV assay is intended as an aid in  the diagnosis of influenza from Nasopharyngeal swab specimens and  should not be used as a sole basis for treatment. Nasal washings and  aspirates are unacceptable for Xpert Xpress SARS-CoV-2/FLU/RSV  testing. Fact Sheet for Patients: PinkCheek.be Fact Sheet for Healthcare Providers: GravelBags.it This test is not yet approved or cleared by the [place] FDA and  has been authorized for detection and/or diagnosis of SARS-CoV-2 by  FDA under an Emergency Use Authorization (EUA). This EUA will remain  in effect (meaning this test can be used) for the duration of the  Covid-19 declaration under Section 564(b)(1) of the Act, 21  U.S.C. section 360bbb-3(b)(1), unless the authorization is  terminated or revoked. Performed at [facility], [place] [address] [place], [place] [address]     Radiology Reports CT Renal Stone Study  Result Date: 11/24/2019 CLINICAL DATA:  Right flank pain. History of kidney stones. EXAM: CT ABDOMEN AND PELVIS WITHOUT CONTRAST TECHNIQUE: Multidetector CT imaging of the abdomen and pelvis was performed following the standard protocol without IV contrast. COMPARISON:  07/21/2014 FINDINGS: Lower chest: Mild elevation of right hemidiaphragm, chronic. Lung bases are clear. Hepatobiliary: No focal liver abnormality is seen. No gallstones, gallbladder wall thickening, or biliary dilatation. Pancreas: No ductal dilatation or inflammation. Spleen: Normal in size without focal abnormality.  Adrenals/Urinary Tract: Normal adrenal glands. Large staghorn calculus filling the right renal pelvis extending into the upper, mid, and lower pole calices. Exact measurements of this stone is difficult given irregular shape, however measures greater than 5 cm cranial caudal. There is mild associated calyceal dilatation. Thinning of the right renal parenchyma. High-density/blood within the proximal right ureter versus ureteral thickening, series 3, image 39. There are no discrete ureteral calculi. Mild right perinephric edema. Multiple small nonobstructing stones in the left kidney. No left hydronephrosis. The left ureter is decompressed. Urinary bladder is partially distended. No bladder stone or wall thickening. Stomach/Bowel: Stomach is unremarkable. No small bowel obstruction or inflammation. Appendix not discretely visualized, no evidence of appendicitis. Small volume of colonic stool. No colonic inflammation. Vascular/Lymphatic: Mild aortic atherosclerosis. No aneurysm. Prominent right retroperitoneal lymph node at the level of the right kidney measuring 8 mm short axis, series 3, image 27. Prominent aortocaval node the level of the lower kidney measures 8 mm, series 3, image 40. There is a 5 mm right retroperitoneal nodule posterior to the right psoas muscle, series 3, image 48 that may represent a lymph node. Reproductive: Uterus and bilateral adnexa are unremarkable. Other:  No free air, free fluid, or intra-abdominal fluid collection. Musculoskeletal: There are no acute or suspicious osseous abnormalities. Transitional lumbosacral anatomy. IMPRESSION: 1. Large staghorn calculus in the right renal pelvis extending into the upper, mid, and lower pole calices. Mild associated calyceal dilatation and minimal perinephric stranding. 2. Soft tissue density in the right proximal ureter, indeterminate. There are multiple prominent right retroperitoneal nodes, raising suspicion for neoplasm. Recommend urology  consultation for direct visualization. Blood clot is also considered but felt less likely 3. Multiple nonobstructing stones in the left kidney. Aortic Atherosclerosis (ICD10-I70.0). Electronically Signed   By: Keith Rake M.D.   On: 11/24/2019 04:05     Time Spent in minutes  30     Desiree Hane M.D on 11/24/2019 at 7:30 AM  To page go to [email or web address] - password [facility]

## 2019-11-24 NOTE — Plan of Care (Signed)
  Problem: Education: Goal: Knowledge of General Education information will improve Description Including pain rating scale, medication(s)/side effects and non-pharmacologic comfort measures Outcome: Progressing   

## 2019-11-24 NOTE — ED Notes (Signed)
Pt given sprite 

## 2019-11-24 NOTE — H&P (Signed)
History and Physical    [name] CVK:[id number] DOB: 15-Jan-1981 DOA: 11/23/2019  PCP: [name], NP  Patient coming from: Home.  Chief Complaint: Abdominal pain.  Patent attorney used.  HPI: [name] is a 38 y.o. female with history of diabetes mellitus has been experiencing right flank pain with nausea vomiting last 2 days.  Also has been having global headache for last 3 days.  Headache is present constantly and has some photophobia.  Denies any neck pain.  Denies any fever chills chest pain shortness of breath.  Vomiting is no blood in it.  Pain in the right flank area is constant nonradiating.  ED Course: In the ER patient was afebrile.  Labs were remarkable for UA showing hematuria and possible UTI.  CT renal study shows right-sided staghorn calculus with calyceal dilation and also proximal ureter has some soft tissue shadow of indeterminate cause and also retroperitoneal nodes.  ER physician discussed with on-call urologist Dr. Nicki Reaper McDiamird who at this time advised to treat for the pyelonephritis and follow-up with urologist outpatient for the calculus.  Further retrocrural nodes ER visit discussed with Dr. Alen Blew on-call oncologist who advised to get interventional radiology consult for guided biopsy.  Patient was started on fluids empiric antibiotics for pyelonephritis.  Covid pending.  Review of Systems: As per HPI, rest all negative.   Past Medical History:  Diagnosis Date   Carpal tunnel syndrome on both sides 01/2016   Headache    "seasonal"   Headache syndrome 05/26/2019   Non-insulin dependent type 2 diabetes mellitus ([place])    states only uses Insulin as needed    Past Surgical History:  Procedure Laterality Date   BILATERAL CARPAL TUNNEL RELEASE Bilateral 02/06/2016   Procedure: BILATERAL CARPAL TUNNEL RELEASE;  Surgeon: Leandrew Koyanagi, MD;  Location: [facility];  Service: Orthopedics;  Laterality:  Bilateral;   NO PAST SURGERIES       reports that she has never smoked. She has never used smokeless tobacco. She reports that she does not drink alcohol or use drugs.  No Known Allergies  Family History  Problem Relation Age of Onset   Diabetes Neg Hx    Hypertension Neg Hx    Hyperlipidemia Neg Hx     Prior to Admission medications   Medication Sig Start Date End Date Taking? Authorizing Provider  Levonorgestrel-Ethinyl Estradiol (AMETHIA) 0.15-0.03 &0.01 MG tablet Take 1 tablet by mouth daily. 05/13/19  Yes [name], NP  amitriptyline (ELAVIL) 25 MG tablet Take 1 tablet (25 mg total) by mouth at bedtime for 30 days. Patient not taking: Reported on 11/24/2019 05/13/19 11/23/28  [name], NP  Blood Glucose Monitoring Suppl (TRUE METRIX METER) w/Device KIT Use as instructed. Check blood glucose levels by fingerstick once per day. 08/23/18   [name], NP  glucose blood (TRUE METRIX BLOOD GLUCOSE TEST) test strip Use as instructed. Check blood glucose levels by fingerstick once per day. 05/13/19   [name], NP  lisinopril (ZESTRIL) 2.5 MG tablet Take 1 tablet (2.5 mg total) by mouth daily. Patient not taking: Reported on 11/24/2019 05/13/19   [name], NP  metFORMIN (GLUCOPHAGE XR) 500 MG 24 hr tablet Take 2 tablets (1,000 mg total) by mouth 2 (two) times daily with a meal for 30 days. Patient not taking: Reported on 11/24/2019 05/13/19 11/23/28  [name], NP  rosuvastatin (CRESTOR) 10 MG tablet Take 1 tablet (10 mg  total) by mouth daily. Patient not taking: Reported on 11/24/2019 05/13/19 11/23/28  [name], NP  TRUEplus Lancets 28G MISC Use as instructed. Check blood glucose levels by fingerstick once per day. 05/13/19   [name], NP    Physical Exam: Constitutional: Moderately built and nourished. Vitals:   11/24/19 0203 11/24/19 0449  BP: 130/73 136/78  Pulse: 84 65  Resp: 17 18  Temp: 98.5 F (36.9 C) 99.1 F (37.3  C)  TempSrc: Oral Oral  SpO2: 100% 99%   Eyes: Anicteric no pallor. ENMT: No discharge from the ears eyes nose or mouth. Neck: No mass felt.  No neck rigidity. Respiratory: No rhonchi or crepitations. Cardiovascular: S1-S2 heard. Abdomen: Soft mild tenderness in the right flank no guarding or rigidity. Musculoskeletal: No edema. Skin: No rash. Neurologic: Alert awake oriented to time place and person.  Moves all extremities. Psychiatric: Appears normal.   Labs on Admission: I have personally reviewed following labs and imaging studies  CBC: Recent Labs  Lab 11/23/19 2242  WBC 7.8  NEUTROABS 5.2  HGB 12.6  HCT 40.1  MCV 79.7*  PLT 004*   Basic Metabolic Panel: Recent Labs  Lab 11/23/19 2242  NA 138  K 3.5  CL 108  CO2 19*  GLUCOSE 142*  BUN 8  CREATININE 0.93  CALCIUM 9.3   GFR: CrCl cannot be calculated (Unknown ideal weight.). Liver Function Tests: Recent Labs  Lab 11/23/19 2242  AST 15  ALT 12  ALKPHOS 59  BILITOT 0.4  PROT 7.9  ALBUMIN 3.6   No results for input(s): LIPASE, AMYLASE in the last 168 hours. No results for input(s): AMMONIA in the last 168 hours. Coagulation Profile: No results for input(s): INR, PROTIME in the last 168 hours. Cardiac Enzymes: No results for input(s): CKTOTAL, CKMB, CKMBINDEX, TROPONINI in the last 168 hours. BNP (last 3 results) No results for input(s): PROBNP in the last 8760 hours. HbA1C: No results for input(s): HGBA1C in the last 72 hours. CBG: No results for input(s): GLUCAP in the last 168 hours. Lipid Profile: No results for input(s): CHOL, HDL, LDLCALC, TRIG, CHOLHDL, LDLDIRECT in the last 72 hours. Thyroid Function Tests: No results for input(s): TSH, T4TOTAL, FREET4, T3FREE, THYROIDAB in the last 72 hours. Anemia Panel: No results for input(s): VITAMINB12, FOLATE, FERRITIN, TIBC, IRON, RETICCTPCT in the last 72 hours. Urine analysis:    Component Value Date/Time   COLORURINE YELLOW 11/23/2019 2239    APPEARANCEUR CLOUDY (A) 11/23/2019 2239   LABSPEC 1.015 11/23/2019 2239   PHURINE 6.0 11/23/2019 2239   GLUCOSEU NEGATIVE 11/23/2019 2239   HGBUR MODERATE (A) 11/23/2019 2239   BILIRUBINUR NEGATIVE 11/23/2019 2239   BILIRUBINUR negative 11/19/2018 1053   BILIRUBINUR neg 03/18/2017 1616   KETONESUR 5 (A) 11/23/2019 2239   PROTEINUR 100 (A) 11/23/2019 2239   UROBILINOGEN 0.2 11/19/2018 1053   UROBILINOGEN 0.2 08/07/2015 2042   NITRITE NEGATIVE 11/23/2019 2239   LEUKOCYTESUR LARGE (A) 11/23/2019 2239   Sepsis Labs: @LABRCNTIP (procalcitonin:4,lacticidven:4) ) Recent Results (from the past 240 hour(s))  Respiratory Panel by RT PCR (Flu A&B, Covid) - Nasopharyngeal Swab     Status: None   Collection Time: 11/24/19  4:40 AM   Specimen: Nasopharyngeal Swab  Result Value Ref Range Status   SARS Coronavirus 2 by RT PCR NEGATIVE NEGATIVE Final    Comment: (NOTE) SARS-CoV-2 target nucleic acids are NOT DETECTED. The SARS-CoV-2 RNA is generally detectable in upper respiratoy specimens during the acute phase of infection. The lowest  concentration of SARS-CoV-2 viral copies this assay can detect is 131 copies/mL. A negative result does not preclude SARS-Cov-2 infection and should not be used as the sole basis for treatment or other patient management decisions. A negative result may occur with  improper specimen collection/handling, submission of specimen other than nasopharyngeal swab, presence of viral mutation(s) within the areas targeted by this assay, and inadequate number of viral copies (<131 copies/mL). A negative result must be combined with clinical observations, patient history, and epidemiological information. The expected result is Negative. Fact Sheet for Patients:  PinkCheek.be Fact Sheet for Healthcare Providers:  GravelBags.it This test is not yet ap proved or cleared by the [place] FDA and  has been  authorized for detection and/or diagnosis of SARS-CoV-2 by FDA under an Emergency Use Authorization (EUA). This EUA will remain  in effect (meaning this test can be used) for the duration of the COVID-19 declaration under Section 564(b)(1) of the Act, 21 U.S.C. section 360bbb-3(b)(1), unless the authorization is terminated or revoked sooner.    Influenza A by PCR NEGATIVE NEGATIVE Final   Influenza B by PCR NEGATIVE NEGATIVE Final    Comment: (NOTE) The Xpert Xpress SARS-CoV-2/FLU/RSV assay is intended as an aid in  the diagnosis of influenza from Nasopharyngeal swab specimens and  should not be used as a sole basis for treatment. Nasal washings and  aspirates are unacceptable for Xpert Xpress SARS-CoV-2/FLU/RSV  testing. Fact Sheet for Patients: PinkCheek.be Fact Sheet for Healthcare Providers: GravelBags.it This test is not yet approved or cleared by the [place] FDA and  has been authorized for detection and/or diagnosis of SARS-CoV-2 by  FDA under an Emergency Use Authorization (EUA). This EUA will remain  in effect (meaning this test can be used) for the duration of the  Covid-19 declaration under Section 564(b)(1) of the Act, 21  U.S.C. section 360bbb-3(b)(1), unless the authorization is  terminated or revoked. Performed at [facility], [place] [address] [place], [place] [address]      Radiological Exams on Admission: CT Renal Stone Study  Result Date: 11/24/2019 CLINICAL DATA:  Right flank pain. History of kidney stones. EXAM: CT ABDOMEN AND PELVIS WITHOUT CONTRAST TECHNIQUE: Multidetector CT imaging of the abdomen and pelvis was performed following the standard protocol without IV contrast. COMPARISON:  07/21/2014 FINDINGS: Lower chest: Mild elevation of right hemidiaphragm, chronic. Lung bases are clear. Hepatobiliary: No focal liver abnormality is seen. No gallstones, gallbladder wall thickening, or  biliary dilatation. Pancreas: No ductal dilatation or inflammation. Spleen: Normal in size without focal abnormality. Adrenals/Urinary Tract: Normal adrenal glands. Large staghorn calculus filling the right renal pelvis extending into the upper, mid, and lower pole calices. Exact measurements of this stone is difficult given irregular shape, however measures greater than 5 cm cranial caudal. There is mild associated calyceal dilatation. Thinning of the right renal parenchyma. High-density/blood within the proximal right ureter versus ureteral thickening, series 3, image 39. There are no discrete ureteral calculi. Mild right perinephric edema. Multiple small nonobstructing stones in the left kidney. No left hydronephrosis. The left ureter is decompressed. Urinary bladder is partially distended. No bladder stone or wall thickening. Stomach/Bowel: Stomach is unremarkable. No small bowel obstruction or inflammation. Appendix not discretely visualized, no evidence of appendicitis. Small volume of colonic stool. No colonic inflammation. Vascular/Lymphatic: Mild aortic atherosclerosis. No aneurysm. Prominent right retroperitoneal lymph node at the level of the right kidney measuring 8 mm short axis, series 3, image 27. Prominent aortocaval node the level of  the lower kidney measures 8 mm, series 3, image 40. There is a 5 mm right retroperitoneal nodule posterior to the right psoas muscle, series 3, image 48 that may represent a lymph node. Reproductive: Uterus and bilateral adnexa are unremarkable. Other: No free air, free fluid, or intra-abdominal fluid collection. Musculoskeletal: There are no acute or suspicious osseous abnormalities. Transitional lumbosacral anatomy. IMPRESSION: 1. Large staghorn calculus in the right renal pelvis extending into the upper, mid, and lower pole calices. Mild associated calyceal dilatation and minimal perinephric stranding. 2. Soft tissue density in the right proximal ureter,  indeterminate. There are multiple prominent right retroperitoneal nodes, raising suspicion for neoplasm. Recommend urology consultation for direct visualization. Blood clot is also considered but felt less likely 3. Multiple nonobstructing stones in the left kidney. Aortic Atherosclerosis (ICD10-I70.0). Electronically Signed   By: Keith Rake M.D.   On: 11/24/2019 04:05     Assessment/Plan Principal Problem:   Pyelonephritis Active Problems:   Nausea and vomiting   DM type 2 (diabetes mellitus, type 2) ([place])    1. Pyelonephritis with staghorn calculus follow cultures.  On empiric antibiotics.  Will need to follow-up with urologist outpatient.  IV fluids.  Diet as tolerated. 2. Soft tissues are in the proximal ureter with retroperitoneal lymph nodes -on-call oncologist advised to get interventional radiology consult for biopsy. 3. Nausea vomiting likely from pyelonephritis. 4. Headache -patient appears nonfocal.  CT head pending.  Suspect migraine. 5. Diabetes mellitus type 2 has history of noncompliance.  Will check hemoglobin A1c and keep patient on sliding scale coverage.  COVID-19 test is pending.  Since patient has intractable nausea vomiting with pyelonephritis with requirement for further assessment of the retroperitoneal lymph nodes patient will need inpatient status.   DVT prophylaxis: SCDs in anticipation of procedure. Code Status: Full code. Family Communication: Discussed with patient. Disposition Plan: Home. Consults called: Interventional radiology. Admission status: Inpatient.   Rise Patience MD [facility] Pager [phone].  If 7PM-7AM, please contact night-coverage [email or web address] Password [email or web address]  11/24/2019, 6:08 AM

## 2019-11-25 LAB — CBC
HCT: 35.7 % — ABNORMAL LOW (ref 36.0–46.0)
Hemoglobin: 11.2 g/dL — ABNORMAL LOW (ref 12.0–15.0)
MCH: 25.6 pg — ABNORMAL LOW (ref 26.0–34.0)
MCHC: 31.4 g/dL (ref 30.0–36.0)
MCV: 81.5 fL (ref 80.0–100.0)
Platelets: 342 10*3/uL (ref 150–400)
RBC: 4.38 MIL/uL (ref 3.87–5.11)
RDW: 15.9 % — ABNORMAL HIGH (ref 11.5–15.5)
WBC: 7.7 10*3/uL (ref 4.0–10.5)
nRBC: 0 % (ref 0.0–0.2)

## 2019-11-25 LAB — BASIC METABOLIC PANEL
Anion gap: 11 (ref 5–15)
BUN: 5 mg/dL — ABNORMAL LOW (ref 6–20)
CO2: 20 mmol/L — ABNORMAL LOW (ref 22–32)
Calcium: 8.5 mg/dL — ABNORMAL LOW (ref 8.9–10.3)
Chloride: 108 mmol/L (ref 98–111)
Creatinine, Ser: 0.81 mg/dL (ref 0.44–1.00)
GFR calc Af Amer: >60 mL/min (ref >60)
GFR calc non Af Amer: >60 mL/min (ref >60)
Glucose, Bld: 98 mg/dL (ref 70–99)
Potassium: 3.4 mmol/L — ABNORMAL LOW (ref 3.5–5.1)
Sodium: 139 mmol/L (ref 135–145)

## 2019-11-25 LAB — GLUCOSE, CAPILLARY
Glucose-Capillary: 88 mg/dL (ref 70–99)
Glucose-Capillary: 85 mg/dL (ref 70–99)

## 2019-11-25 LAB — HIV ANTIBODY (ROUTINE TESTING W REFLEX): HIV Screen 4th Generation wRfx: NONREACTIVE

## 2019-11-25 MED ORDER — IBUPROFEN 600 MG PO TABS
600.0000 mg | ORAL_TABLET | Freq: Four times a day (QID) | ORAL | Status: DC | PRN
  Administered 2019-11-25 – 2019-11-26 (×2): 600 mg via ORAL
  Filled 2019-11-25 (×2): qty 1

## 2019-11-25 MED ORDER — POTASSIUM CHLORIDE 20 MEQ PO PACK
40.0000 meq | PACK | Freq: Once | ORAL | Status: AC
  Administered 2019-11-25: 40 meq via ORAL
  Filled 2019-11-25: qty 2

## 2019-11-25 MED ORDER — KETOROLAC TROMETHAMINE 15 MG/ML IJ SOLN
15.0000 mg | Freq: Three times a day (TID) | INTRAMUSCULAR | Status: DC | PRN
  Administered 2019-11-25: 15 mg via INTRAVENOUS
  Filled 2019-11-25: qty 1

## 2019-11-25 NOTE — Progress Notes (Signed)
Interventional Radiology Note   38 yo female with pyelonephritis and right staghorn calculus.   CT shows questionable inflammatory, possible neoplastic tissue at the UPJ, near staghorn.   Patient referred for biopsy.   Discussed with Dr. Teryl Lucy.  I do not see a target for a percutaneous biopsy.  While I agree that the tissue is nonspecific, this is most likely inflammatory in the setting of the staghorn and pyelo.    I do agree with outpatient work up with Urology, possible contrast CT, and if there is further concern please refer back to [facility]IR for evaluation of possible biopsy options.     Signed,  Dulcy Fanny. Earleen Newport, DO

## 2019-11-25 NOTE — Progress Notes (Signed)
[facility]  PROGRESS NOTE  [name] [id number] DOB: 02/09/1981 DOA: 11/23/2019 PCP: [name], NP Admit date - 11/23/2019   Admitting Physician Rise Patience, MD  Outpatient Primary MD for the patient is [name], NP  LOS - 1 Brief Narrative   [name] is a 38 y.o. 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  No signs of sepsis on admission. UA showed moderate hgb, large leukocytes with few bacteria.   Hospital course complicated by right ureter soft tissue density with retroperitoneal lymph nodes on CT abd   Subjective   Used Video Translator for Spanish  Ms.  [name] today reports some dizziness with standing.  Abdominal pain much better controlled.  Has some neck pain and mild headache.  Wants to try to advance her diet..   A & P   Acute pyelonephritis complicated by large staghorn calculus in right renal pelvis.  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 Matilde Sprang)  Right ureter soft tissue density with Retroperitoneal lymph nodes.  Could be inflammation or evidence of neoplastic disease but more likely inflammation the setting of active infection.  CT done without contrast due to AKI -Ensure close outpatient follow-up with urology -IR recommends contrast CT for further  evaluation once infection treated as outpatient  Headache with photophobia, improved.  Has History of migraine -CT head non acute --tylenol PRN, warm compress for support  AKI with Non anion gap metabolic acidosis, slightly improved. Likely related to pyelonephritis and calculus with decrease PO intake.  Orthostatics within normal limits  Baseline creatinine 0.4-0.5. currently 0.8, good urine output, advancing deit  -Monitor urine output, avoid nephrotoxins, monitor daily BMP -DC IV fluids given with oral intake  Type 2 diabetes, well controlled, A1c 5.8 -sliding scale insulin as needed while monitoring CBG -Hold home metformin  Hypertension, at goal. --holding home lisinopril given mild AKI  COVID screen.Negative on admission  Hyperlipidemia -Resume home Crestor   Mood disorder -Reports not taking any meds  Family Communication  :  none  Code Status :  FULL  Disposition Plan  :  pain control,  monitor oral intake and kidney function, likely DC in 24 hours  Consults  :  IR  Procedures  :  None currently  DVT Prophylaxis  :  SCDs   Lab Results  Component Value Date   PLT 342 11/25/2019    Diet :  Diet Order            Diet regular Room service appropriate? Yes; Fluid consistency: Thin  Diet effective now               Inpatient Medications Scheduled Meds: . insulin aspart  0-9 Units Subcutaneous TID WC   Continuous Infusions: . cefTRIAXone (ROCEPHIN)  IV 2 g (11/25/19 0456)   PRN Meds:.acetaminophen **OR** acetaminophen, ondansetron **OR** ondansetron (ZOFRAN) IV  Antibiotics  :   Anti-infectives (From admission, onward)   Start  Dose/Rate Route Frequency Ordered Stop   11/25/19 0400  cefTRIAXone (ROCEPHIN) 2 g in sodium chloride 0.9 % 100 mL IVPB     2 g 200 mL/hr over 30 Minutes Intravenous Every 24 hours 11/24/19 0617     11/24/19 0630  cefTRIAXone (ROCEPHIN) 2 g in sodium chloride 0.9 % 100 mL IVPB     2 g 200 mL/hr over 30 Minutes  Intravenous  Once 11/24/19 0623 11/24/19 0840   11/24/19 0430  cefTRIAXone (ROCEPHIN) 1 g in sodium chloride 0.9 % 100 mL IVPB  Status:  Discontinued     1 g 200 mL/hr over 30 Minutes Intravenous  Once 11/24/19 0420 11/24/19 0623   11/24/19 0345  cefTRIAXone (ROCEPHIN) injection 1 g  Status:  Discontinued     1 g Intramuscular  Once 11/24/19 0340 11/24/19 0420       Objective   Vitals:   11/25/19 0357 11/25/19 1049 11/25/19 1051 11/25/19 1053  BP: 108/70 116/69 (!) 118/95 124/84  Pulse: (!) 53 60 61 74  Resp: 17 18 18 18   Temp: 98.5 F (36.9 C) 98.9 F (37.2 C)    TempSrc: Oral Oral    SpO2: 100% 100% 100% 100%    SpO2: 100 %  Wt Readings from Last 3 Encounters:  11/29/18 64.2 kg  11/19/18 64.4 kg  09/15/18 65.2 kg     Intake/Output Summary (Last 24 hours) at 11/25/2019 1442 Last data filed at 11/25/2019 0539 Gross per 24 hour  Intake 612.5 ml  Output 500 ml  Net 112.5 ml    Physical Exam:  Awake Alert, Oriented X 3, Normal affect No new F.N deficits,  [place].AT, No JVD Reproducible posterior neck pain with palpation, normal range of motion of neck Symmetrical Chest wall movement, Good air movement bilaterally, CTAB RRR,No Gallops,Rubs or new Murmurs,  +ve B.Sounds, Abd Soft, nontender, no CVA tenderness, No Cyanosis, Clubbing or edema, No new Rash or bruise    I have personally reviewed the following:   Data Reviewed:  CBC Recent Labs  Lab 11/23/19 2242 11/25/19 0240  WBC 7.8 7.7  HGB 12.6 11.2*  HCT 40.1 35.7*  PLT 412* 342  MCV 79.7* 81.5  MCH 25.0* 25.6*  MCHC 31.4 31.4  RDW 15.8* 15.9*  LYMPHSABS 2.2  --   MONOABS 0.3  --   EOSABS 0.0  --   BASOSABS 0.0  --     Chemistries  Recent Labs  Lab 11/23/19 2242 11/25/19 0240  NA 138 139  K 3.5 3.4*  CL 108 108  CO2 19* 20*  GLUCOSE 142* 98  BUN 8 5*  CREATININE 0.93 0.81  CALCIUM 9.3 8.5*  AST 15  --   ALT 12  --   ALKPHOS 59  --   BILITOT 0.4  --     ------------------------------------------------------------------------------------------------------------------ No results for input(s): CHOL, HDL, LDLCALC, TRIG, CHOLHDL, LDLDIRECT in the last 72 hours.  Lab Results  Component Value Date   HGBA1C 5.8 (A) 11/19/2018   ------------------------------------------------------------------------------------------------------------------ No results for input(s): TSH, T4TOTAL, T3FREE, THYROIDAB in the last 72 hours.  Invalid input(s): FREET3 ------------------------------------------------------------------------------------------------------------------ No results for input(s): VITAMINB12, FOLATE, FERRITIN, TIBC, IRON, RETICCTPCT in the last 72 hours.  Coagulation profile No results for input(s): INR, PROTIME in the last 168 hours.  No results for input(s): DDIMER in the last 72 hours.  Cardiac Enzymes No results for input(s): CKMB, TROPONINI, MYOGLOBIN in the last 168 hours.  Invalid input(s): CK ------------------------------------------------------------------------------------------------------------------ No results found for: BNP  Micro  Results Recent Results (from the past 240 hour(s))  Respiratory Panel by RT PCR (Flu A&B, Covid) - Nasopharyngeal Swab     Status: None   Collection Time: 11/24/19  4:40 AM   Specimen: Nasopharyngeal Swab  Result Value Ref Range Status   SARS Coronavirus 2 by RT PCR NEGATIVE NEGATIVE Final    Comment: (NOTE) SARS-CoV-2 target nucleic acids are NOT DETECTED. The SARS-CoV-2 RNA is generally detectable in upper respiratoy specimens during the acute phase of infection. The lowest concentration of SARS-CoV-2 viral copies this assay can detect is 131 copies/mL. A negative result does not preclude SARS-Cov-2 infection and should not be used as the sole basis for treatment or other patient management decisions. A negative result may occur with  improper specimen collection/handling, submission  of specimen other than nasopharyngeal swab, presence of viral mutation(s) within the areas targeted by this assay, and inadequate number of viral copies (<131 copies/mL). A negative result must be combined with clinical observations, patient history, and epidemiological information. The expected result is Negative. Fact Sheet for Patients:  PinkCheek.be Fact Sheet for Healthcare Providers:  GravelBags.it This test is not yet ap proved or cleared by the [place] FDA and  has been authorized for detection and/or diagnosis of SARS-CoV-2 by FDA under an Emergency Use Authorization (EUA). This EUA will remain  in effect (meaning this test can be used) for the duration of the COVID-19 declaration under Section 564(b)(1) of the Act, 21 U.S.C. section 360bbb-3(b)(1), unless the authorization is terminated or revoked sooner.    Influenza A by PCR NEGATIVE NEGATIVE Final   Influenza B by PCR NEGATIVE NEGATIVE Final    Comment: (NOTE) The Xpert Xpress SARS-CoV-2/FLU/RSV assay is intended as an aid in  the diagnosis of influenza from Nasopharyngeal swab specimens and  should not be used as a sole basis for treatment. Nasal washings and  aspirates are unacceptable for Xpert Xpress SARS-CoV-2/FLU/RSV  testing. Fact Sheet for Patients: PinkCheek.be Fact Sheet for Healthcare Providers: GravelBags.it This test is not yet approved or cleared by the [place] FDA and  has been authorized for detection and/or diagnosis of SARS-CoV-2 by  FDA under an Emergency Use Authorization (EUA). This EUA will remain  in effect (meaning this test can be used) for the duration of the  Covid-19 declaration under Section 564(b)(1) of the Act, 21  U.S.C. section 360bbb-3(b)(1), unless the authorization is  terminated or revoked. Performed at [facility], [place] [address] [place],  [place] [address]     Radiology Reports CT HEAD WO CONTRAST  Result Date: 11/24/2019 CLINICAL DATA:  Headache, intracranial hemorrhage suspected. Additional history provided: Left-sided headache. EXAM: CT HEAD WITHOUT CONTRAST TECHNIQUE: Contiguous axial images were obtained from the base of the skull through the vertex without intravenous contrast. COMPARISON:  No pertinent prior studies available for comparison. FINDINGS: Brain: No evidence of acute intracranial hemorrhage. No demarcated cortical infarction. No evidence of intracranial mass. No midline shift or extra-axial fluid collection. Well demarcated hypodensity within the inferior left basal ganglia likely reflecting a prominent perivascular space. Cerebral volume is normal for age. Vascular: No hyperdense vessel. Skull: Normal. Negative for fracture or focal lesion. Sinuses/Orbits: Visualized orbits demonstrate no acute abnormality. Scattered trace paranasal sinus mucosal thickening. No significant mastoid effusion. IMPRESSION: No evidence of acute intracranial abnormality. Electronically Signed   By: Kellie Simmering DO   On: 11/24/2019 08:23   CT Renal Stone Study  Result Date: 11/24/2019 CLINICAL DATA:  Right flank pain. History of  kidney stones. EXAM: CT ABDOMEN AND PELVIS WITHOUT CONTRAST TECHNIQUE: Multidetector CT imaging of the abdomen and pelvis was performed following the standard protocol without IV contrast. COMPARISON:  07/21/2014 FINDINGS: Lower chest: Mild elevation of right hemidiaphragm, chronic. Lung bases are clear. Hepatobiliary: No focal liver abnormality is seen. No gallstones, gallbladder wall thickening, or biliary dilatation. Pancreas: No ductal dilatation or inflammation. Spleen: Normal in size without focal abnormality. Adrenals/Urinary Tract: Normal adrenal glands. Large staghorn calculus filling the right renal pelvis extending into the upper, mid, and lower pole calices. Exact measurements of this stone is difficult given  irregular shape, however measures greater than 5 cm cranial caudal. There is mild associated calyceal dilatation. Thinning of the right renal parenchyma. High-density/blood within the proximal right ureter versus ureteral thickening, series 3, image 39. There are no discrete ureteral calculi. Mild right perinephric edema. Multiple small nonobstructing stones in the left kidney. No left hydronephrosis. The left ureter is decompressed. Urinary bladder is partially distended. No bladder stone or wall thickening. Stomach/Bowel: Stomach is unremarkable. No small bowel obstruction or inflammation. Appendix not discretely visualized, no evidence of appendicitis. Small volume of colonic stool. No colonic inflammation. Vascular/Lymphatic: Mild aortic atherosclerosis. No aneurysm. Prominent right retroperitoneal lymph node at the level of the right kidney measuring 8 mm short axis, series 3, image 27. Prominent aortocaval node the level of the lower kidney measures 8 mm, series 3, image 40. There is a 5 mm right retroperitoneal nodule posterior to the right psoas muscle, series 3, image 48 that may represent a lymph node. Reproductive: Uterus and bilateral adnexa are unremarkable. Other: No free air, free fluid, or intra-abdominal fluid collection. Musculoskeletal: There are no acute or suspicious osseous abnormalities. Transitional lumbosacral anatomy. IMPRESSION: 1. Large staghorn calculus in the right renal pelvis extending into the upper, mid, and lower pole calices. Mild associated calyceal dilatation and minimal perinephric stranding. 2. Soft tissue density in the right proximal ureter, indeterminate. There are multiple prominent right retroperitoneal nodes, raising suspicion for neoplasm. Recommend urology consultation for direct visualization. Blood clot is also considered but felt less likely 3. Multiple nonobstructing stones in the left kidney. Aortic Atherosclerosis (ICD10-I70.0). Electronically Signed   By:  Keith Rake M.D.   On: 11/24/2019 04:05     Time Spent in minutes  30     Desiree Hane M.D on 11/25/2019 at 2:42 PM  To page go to [email or web address] - password [email or web address]

## 2019-11-26 DIAGNOSIS — N2889 Other specified disorders of kidney and ureter: Secondary | ICD-10-CM

## 2019-11-26 DIAGNOSIS — R112 Nausea with vomiting, unspecified: Secondary | ICD-10-CM

## 2019-11-26 DIAGNOSIS — N200 Calculus of kidney: Secondary | ICD-10-CM

## 2019-11-26 MED ORDER — IBUPROFEN 600 MG PO TABS: 600.0000 mg | ORAL_TABLET | Freq: Four times a day (QID) | ORAL | 0 refills | Status: DC | PRN

## 2019-11-26 MED ORDER — CEFDINIR 300 MG PO CAPS: 300.0000 mg | ORAL_CAPSULE | Freq: Two times a day (BID) | ORAL | 0 refills | Status: AC

## 2019-11-26 NOTE — Discharge Instructions (Signed)
Take your antibiotic cefdenir twice a day for 7 days    [phone] [place] [address]     Next Steps: Go on 12/22/2019    Instructions: Go to [facility] on 1.7.2021 at 1:15 pm. Arrive 30 minutes early for follow up with urology physician

## 2019-11-26 NOTE — Discharge Summary (Signed)
[name] LGX:[id number] DOB: [birth year] DOA: 11/23/2019  PCP: [name], NP  Admit date: 11/23/2019 Discharge date: 11/26/2019  Admitted From: home Disposition:  home  Recommendations for Outpatient Follow-up:  1. Follow up with PCP in 1-2 weeks 2. Please obtain BMP/CBC in one week 3. New medications: Cefdenir x 7 days, Ibuprofen PRN 4. Follow up arranged with [facility], 12/22/19 5. Please follow up on the following pending results:  Home Health:NO (YES/NO) Equipment/Devices:none   Discharge Condition:Stable  CODE STATUS:FULL  Diet recommendation: Heart Healthy Brief/Interim Summary: History of present illness:  [name] is a 38 y.o. 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  No signs of sepsis on admission. UA showed moderate hgb, large leukocytes with few bacteria.   Hospital course complicated by right ureter soft tissue density with retroperitoneal lymph nodes on CT abd    Remaining hospital course addressed in problem based format below:   Hospital Course:   Acute pyelonephritis complicated by large staghorn calculus in right renal pelvis.   No sepsis throughout hospital stay, slowly advanced diet while on IV ceftriaxone before transitioning to cefdenir to continue for additional 7 days on discharge.  Nausea/emesis improvedStaghorn has been there for years and not emergent and urology advised outpatient follow up in discussion with EDP. Blood cultures unremarkable, no urine cultures obtained prior to IV antibiotics initiation on admission.   -Cefdenir x 7 days -Ibuprofen for pain control -Ensure outpatient urology follow-up cardiologist  recommendations and sees Dr. Matilde Sprang) --F/u with Urology arranged, 1/7/ at [facility], instructions provided to patient via I pad video translator on day of discharge  Right ureter soft tissue density with Retroperitoneal lymph nodes.  Could be inflammation or evidence of neoplastic disease but more likely inflammation in the setting of active infection.  CT done without contrast due to AKI.  -Ensure close outpatient follow-up with urology -IR recommends outpatient contrast CT for further evaluation once infection treated and resolved with close urology follow up  Headache with photophobia, improved.  Has History of migraine -CT head non acute --tylenol/ibuprfoen PRN, cold compress for support  AKI with Non anion gap metabolic acidosis,  improved. Likely related to pyelonephritis and calculus with decrease PO intake.  Orthostatics within normal limits,  Baseline creatinine 0.4-0.5. currently 0.8, good urine output, eating again so IVF discontinued -BMP on hospital f/u with PCP  Type 2 diabetes, well controlled, A1c 5.8 -sliding scale insulin as needed while monitoring CBG -Held home metformin during hospital stay  Hypertension, at goal. --not taking listed lisinopril at home, discuss with PCP  COVID screen.Negative on admission  Hyperlipidemia -not taking crestor at home, discuss with PCP  Mood disorder -Reports not taking any meds   Consultations:  IR, Urology(EDP)  Procedures/Studies: None Subjective: Headache much improved, eating ok. Ready to go home Discharge Exam: Vitals:   11/26/19 0540 11/26/19 1313  BP: 106/65 124/83  Pulse: 60 75  Resp:  14  Temp: 98.2 F (36.8 C) 98.6 F (37 C)  SpO2: 100% 100%   Vitals:   11/25/19 1053 11/25/19 2205 11/26/19 0540 11/26/19 1313  BP: 124/84 118/65 106/65 124/83  Pulse: 74 68 60 75  Resp: 18   14  Temp:  97.8  F (36.6 C) 98.2 F (36.8 C) 98.6 F (37 C)  TempSrc:   Oral Oral  SpO2: 100% 100% 100% 100%     General: Lying in bed, no apparent distress Eyes: EOMI, anicteric ENT: Oral Mucosa clear and moist Cardiovascular: regular rate and rhythm, no murmurs, rubs or gallops, no edema, Respiratory: Normal respiratory effort, lungs clear to auscultation bilaterally Abdomen: soft, non-distended, non-tender, normal bowel sounds Skin: No Rash MSK: normal ROM of neck  Neurologic: Grossly no focal neuro deficit.Mental status AAOx3, speech normal, Psychiatric:Appropriate affect, and mood  Discharge Diagnoses:  Principal Problem:   Pyelonephritis Active Problems:   Nausea and vomiting   DM type 2 (diabetes mellitus, type 2) ([place])    Discharge Instructions  Discharge Instructions    Diet - low sodium heart healthy   Complete by: As directed    Increase activity slowly   Complete by: As directed      Allergies as of 11/26/2019   No Known Allergies     Medication List    STOP taking these medications   amitriptyline 25 MG tablet Commonly known as: ELAVIL   lisinopril 2.5 MG tablet Commonly known as: ZESTRIL   metFORMIN 500 MG 24 hr tablet Commonly known as: Glucophage XR   rosuvastatin 10 MG tablet Commonly known as: CRESTOR     TAKE these medications   cefdinir 300 MG capsule Commonly known as: OMNICEF Take 1 capsule (300 mg total) by mouth 2 (two) times daily for 7 days.   glucose blood test strip Commonly known as: True Metrix Blood Glucose Test Use as instructed. Check blood glucose levels by fingerstick once per day.   ibuprofen 600 MG tablet Commonly known as: ADVIL Take 1 tablet (600 mg total) by mouth every 6 (six) hours as needed for headache.   Levonorgestrel-Ethinyl Estradiol 0.15-0.03 &0.01 MG tablet Commonly known as: AMETHIA Take 1 tablet by mouth daily.   True Metrix Meter w/Device Kit Use as instructed. Check blood glucose levels by fingerstick once per day.   TRUEplus Lancets 28G Misc Use as instructed. Check blood glucose levels by  fingerstick once per day.      Follow-up Information    [facility]. Go on 12/22/2019.   Why: Go to [facility] on 1.7.2021 at 1:15 pm. Arrive 30 minutes early for follow up with urology physician. Contact information: [place] [phone]         No Known Allergies      The results of significant diagnostics from this hospitalization (including imaging, microbiology, ancillary and laboratory) are listed below for reference.     Microbiology: Recent Results (from the past 240 hour(s))  Respiratory Panel by RT PCR (Flu A&B, Covid) - Nasopharyngeal Swab     Status: None   Collection Time: 11/24/19  4:40 AM   Specimen: Nasopharyngeal Swab  Result Value Ref Range Status   SARS Coronavirus 2 by RT PCR NEGATIVE NEGATIVE Final    Comment: (NOTE) SARS-CoV-2 target nucleic acids are NOT DETECTED. The SARS-CoV-2 RNA is generally detectable in upper respiratoy specimens during the acute phase of infection. The lowest concentration of SARS-CoV-2 viral copies this assay can detect is 131 copies/mL. A negative result does not preclude SARS-Cov-2 infection and should not be used as the sole basis for treatment or other patient management decisions. A negative result may occur with  improper specimen collection/handling, submission of specimen other than nasopharyngeal swab, presence of viral mutation(s) within the  areas targeted by this assay, and inadequate number of viral copies (<131 copies/mL). A negative result must be combined with clinical observations, patient history, and epidemiological information. The expected result is Negative. Fact Sheet for Patients:  PinkCheek.be Fact Sheet for Healthcare Providers:  GravelBags.it This test is not yet ap proved or cleared by the [place] FDA and  has been authorized for detection and/or diagnosis of  SARS-CoV-2 by FDA under an Emergency Use Authorization (EUA). This EUA will remain  in effect (meaning this test can be used) for the duration of the COVID-19 declaration under Section 564(b)(1) of the Act, 21 U.S.C. section 360bbb-3(b)(1), unless the authorization is terminated or revoked sooner.    Influenza A by PCR NEGATIVE NEGATIVE Final   Influenza B by PCR NEGATIVE NEGATIVE Final    Comment: (NOTE) The Xpert Xpress SARS-CoV-2/FLU/RSV assay is intended as an aid in  the diagnosis of influenza from Nasopharyngeal swab specimens and  should not be used as a sole basis for treatment. Nasal washings and  aspirates are unacceptable for Xpert Xpress SARS-CoV-2/FLU/RSV  testing. Fact Sheet for Patients: PinkCheek.be Fact Sheet for Healthcare Providers: GravelBags.it This test is not yet approved or cleared by the [place] FDA and  has been authorized for detection and/or diagnosis of SARS-CoV-2 by  FDA under an Emergency Use Authorization (EUA). This EUA will remain  in effect (meaning this test can be used) for the duration of the  Covid-19 declaration under Section 564(b)(1) of the Act, 21  U.S.C. section 360bbb-3(b)(1), unless the authorization is  terminated or revoked. Performed at [facility], [place] [address], [place], [place] [address]      Labs: BNP (last 3 results) No results for input(s): BNP in the last 8760 hours. Basic Metabolic Panel: Recent Labs  Lab 11/23/19 2242 11/25/19 0240  NA 138 139  K 3.5 3.4*  CL 108 108  CO2 19* 20*  GLUCOSE 142* 98  BUN 8 5*  CREATININE 0.93 0.81  CALCIUM 9.3 8.5*   Liver Function Tests: Recent Labs  Lab 11/23/19 2242  AST 15  ALT 12  ALKPHOS 59  BILITOT 0.4  PROT 7.9  ALBUMIN 3.6   No results for input(s): LIPASE, AMYLASE in the last 168 hours. No results for input(s): AMMONIA in the last 168 hours. CBC: Recent Labs  Lab 11/23/19 2242  11/25/19 0240  WBC 7.8 7.7  NEUTROABS 5.2  --   HGB 12.6 11.2*  HCT 40.1 35.7*  MCV 79.7* 81.5  PLT 412* 342   Cardiac Enzymes: No results for input(s): CKTOTAL, CKMB, CKMBINDEX, TROPONINI in the last 168 hours. BNP: Invalid input(s): POCBNP CBG: Recent Labs  Lab 11/24/19 1157 11/24/19 1641 11/24/19 2017 11/25/19 0759 11/25/19 1153  GLUCAP 83 133* 133* 88 85   D-Dimer No results for input(s): DDIMER in the last 72 hours. Hgb A1c No results for input(s): HGBA1C in the last 72 hours. Lipid Profile No results for input(s): CHOL, HDL, LDLCALC, TRIG, CHOLHDL, LDLDIRECT in the last 72 hours. Thyroid function studies No results for input(s): TSH, T4TOTAL, T3FREE, THYROIDAB in the last 72 hours.  Invalid input(s): FREET3 Anemia work up No results for input(s): VITAMINB12, FOLATE, FERRITIN, TIBC, IRON, RETICCTPCT in the last 72 hours. Urinalysis    Component Value Date/Time   COLORURINE YELLOW 11/23/2019 2239   APPEARANCEUR CLOUDY (A) 11/23/2019 2239   LABSPEC 1.015 11/23/2019 2239   PHURINE 6.0 11/23/2019 2239   GLUCOSEU NEGATIVE 11/23/2019 2239   HGBUR MODERATE (A)  11/23/2019 2239   [place] 11/23/2019 2239   BILIRUBINUR negative 11/19/2018 1053   BILIRUBINUR neg 03/18/2017 1616   KETONESUR 5 (A) 11/23/2019 2239   PROTEINUR 100 (A) 11/23/2019 2239   UROBILINOGEN 0.2 11/19/2018 1053   UROBILINOGEN 0.2 08/07/2015 2042   NITRITE NEGATIVE 11/23/2019 2239   LEUKOCYTESUR LARGE (A) 11/23/2019 2239   Sepsis Labs Invalid input(s): PROCALCITONIN,  WBC,  LACTICIDVEN Microbiology Recent Results (from the past 240 hour(s))  Respiratory Panel by RT PCR (Flu A&B, Covid) - Nasopharyngeal Swab     Status: None   Collection Time: 11/24/19  4:40 AM   Specimen: Nasopharyngeal Swab  Result Value Ref Range Status   SARS Coronavirus 2 by RT PCR NEGATIVE NEGATIVE Final    Comment: (NOTE) SARS-CoV-2 target nucleic acids are NOT DETECTED. The SARS-CoV-2 RNA is generally  detectable in upper respiratoy specimens during the acute phase of infection. The lowest concentration of SARS-CoV-2 viral copies this assay can detect is 131 copies/mL. A negative result does not preclude SARS-Cov-2 infection and should not be used as the sole basis for treatment or other patient management decisions. A negative result may occur with  improper specimen collection/handling, submission of specimen other than nasopharyngeal swab, presence of viral mutation(s) within the areas targeted by this assay, and inadequate number of viral copies (<131 copies/mL). A negative result must be combined with clinical observations, patient history, and epidemiological information. The expected result is Negative. Fact Sheet for Patients:  PinkCheek.be Fact Sheet for Healthcare Providers:  GravelBags.it This test is not yet ap proved or cleared by the [place] FDA and  has been authorized for detection and/or diagnosis of SARS-CoV-2 by FDA under an Emergency Use Authorization (EUA). This EUA will remain  in effect (meaning this test can be used) for the duration of the COVID-19 declaration under Section 564(b)(1) of the Act, 21 U.S.C. section 360bbb-3(b)(1), unless the authorization is terminated or revoked sooner.    Influenza A by PCR NEGATIVE NEGATIVE Final   Influenza B by PCR NEGATIVE NEGATIVE Final    Comment: (NOTE) The Xpert Xpress SARS-CoV-2/FLU/RSV assay is intended as an aid in  the diagnosis of influenza from Nasopharyngeal swab specimens and  should not be used as a sole basis for treatment. Nasal washings and  aspirates are unacceptable for Xpert Xpress SARS-CoV-2/FLU/RSV  testing. Fact Sheet for Patients: PinkCheek.be Fact Sheet for Healthcare Providers: GravelBags.it This test is not yet approved or cleared by the [place] FDA and  has been  authorized for detection and/or diagnosis of SARS-CoV-2 by  FDA under an Emergency Use Authorization (EUA). This EUA will remain  in effect (meaning this test can be used) for the duration of the  Covid-19 declaration under Section 564(b)(1) of the Act, 21  U.S.C. section 360bbb-3(b)(1), unless the authorization is  terminated or revoked. Performed at [facility], [place] [address], [place], [place] [address]      Time coordinating discharge: Over 30 minutes  SIGNED:   Desiree Hane, MD  [facility] 11/26/2019, 3:16 PM Pager   If 7PM-7AM, please contact night-coverage [email or web address] Password [email or web address]

## 2019-11-26 NOTE — Plan of Care (Signed)
  Problem: Education: Goal: Knowledge of General Education information will improve Description Including pain rating scale, medication(s)/side effects and non-pharmacologic comfort measures Outcome: Progressing   

## 2019-11-26 NOTE — Progress Notes (Signed)
[name] to be D/C'd  per MD order. Discussed with the patient and all questions fully answered.  VSS, Skin clean, dry and intact without evidence of skin break down, no evidence of skin tears noted.  IV catheter discontinued intact. Site without signs and symptoms of complications. Dressing and pressure applied.  An After Visit Summary was printed and given to the patient. Patient received prescription.  D/c education completed with patient/family including follow up instructions, medication list, d/c activities limitations if indicated, with other d/c instructions as indicated by MD - patient able to verbalize understanding, all questions fully answered.   Patient instructed to return to ED, call 911, or call MD for any changes in condition.   Patient to be escorted via [place], and D/C home via private auto.

## 2019-11-26 NOTE — Plan of Care (Signed)
  Problem: Education: Goal: Knowledge of General Education information will improve Description: Including pain rating scale, medication(s)/side effects and non-pharmacologic comfort measures Outcome: Adequate for Discharge   

## 2020-07-13 ENCOUNTER — Other Ambulatory Visit: Payer: Self-pay

## 2020-07-13 ENCOUNTER — Ambulatory Visit: Payer: Self-pay | Attending: Nurse Practitioner

## 2020-07-25 ENCOUNTER — Other Ambulatory Visit: Payer: Self-pay | Admitting: Nurse Practitioner

## 2020-07-25 DIAGNOSIS — Z789 Other specified health status: Secondary | ICD-10-CM

## 2020-07-25 NOTE — Telephone Encounter (Signed)
Requested medication (s) are due for refill today:  Yes  Requested medication (s) are on the active medication list:   Yes  Future visit scheduled:   Yes on 08/22/2020   Last ordered: 05/13/2019 1 package, RF 4  Returned because the option of refilling by the package has been changed to the number of pills.   Wasn't sure how to approve it.     Requested Prescriptions  Pending Prescriptions Disp Refills   Levonorgestrel-Ethinyl Estradiol (AMETHIA) 0.15-0.03 &0.01 MG tablet      Sig: Take 1 tablet by mouth daily.      OB/GYN:  Contraceptives Passed - 07/25/2020 12:26 PM      Passed - Last BP in normal range    BP Readings from Last 1 Encounters:  11/26/19 124/83          Passed - Valid encounter within last 12 months    Recent Outpatient Visits           1 year ago Type 2 diabetes mellitus without complication, unspecified whether long term insulin use ([place])   [place], Vernia Buff, NP   1 year ago Tension headache   [place], [place] W, NP   1 year ago Type 2 diabetes mellitus without complication, unspecified whether long term insulin use ([place])   [place], MD   1 year ago Encounter for Papanicolaou smear for cervical cancer screening   [place], [place] W, NP   1 year ago Type 2 diabetes mellitus without complication, unspecified whether long term insulin use [facility])   [place], Vernia Buff, NP       Future Appointments             In 4 weeks [name], NP [place]

## 2020-07-25 NOTE — Telephone Encounter (Signed)
Patient requesting Levonorgestrel-Ethinyl Estradiol (AMETHIA) 0.15-0.03 &0.01 MG tablet, informed patient please allow 48 to 72 hour turn around time   [place] [employer] Phone:  [phone]  Fax:  [phone]

## 2020-07-26 MED ORDER — LEVONORGEST-ETH ESTRAD 91-DAY 0.15-0.03 &0.01 MG PO TABS: 1.0000 | ORAL_TABLET | Freq: Every day | ORAL | 0 refills | Status: DC

## 2020-07-27 MED FILL — LEVONO-E ESTRAD 0.15-0.03-0: 0.15-0.03 & | 28 days supply | Qty: 28 | Fill #0

## 2020-08-14 ENCOUNTER — Other Ambulatory Visit: Payer: Self-pay

## 2020-08-14 ENCOUNTER — Telehealth: Payer: HRSA Program | Admitting: Physician Assistant

## 2020-08-14 DIAGNOSIS — Z20822 Contact with and (suspected) exposure to covid-19: Secondary | ICD-10-CM

## 2020-08-14 DIAGNOSIS — U071 COVID-19: Secondary | ICD-10-CM

## 2020-08-14 LAB — POC COVID19 BINAXNOW: SARS Coronavirus 2 Ag: POSITIVE — AB

## 2020-08-14 NOTE — Patient Instructions (Signed)
COVID-3 COVID-19 El COVID-19 es una infeccin respiratoria causada por un virus llamado coronavirus tipo 2 causante del sndrome respiratorio agudo grave (SARS-CoV-2). La enfermedad tambin se conoce como enfermedad por coronavirus o nuevo coronavirus. En algunas personas, el virus puede no ocasionar sntomas. En otras, puede producir una infeccin grave. La infeccin puede empeorar rpidamente y causar complicaciones, como:  Neumona o infeccin en los pulmones.  Sndrome de dificultad respiratoria aguda o SDRA. Es una afeccin que se caracteriza por la acumulacin de lquido en los pulmones, que impide que los pulmones se llenen de aire y pasen oxgeno a Herbalist.  Insuficiencia respiratoria aguda. Es una afeccin que se caracteriza porque no pasa suficiente oxgeno de los pulmones al cuerpo o porque el dixido de carbono no pasa de los pulmones hacia afuera del cuerpo.  Sepsis o choque sptico. Se trata de una reaccin grave del cuerpo ante una infeccin.  Problemas de coagulacin.  Infecciones secundarias debido a bacterias u hongos.  Falla de rganos. Ocurre cuando los rganos del cuerpo dejan de funcionar. El virus que causa el COVID-19 es contagioso. Esto significa que puede transmitirse de [place] persona a otra a travs de las gotitas de saliva de la tos y de los estornudos (secreciones respiratorias). Cules son las causas? Esta enfermedad es causada por un virus. Usted puede contagiarse con este virus:  Al inspirar las gotitas de una persona infectada. Las [employer] pueden diseminarse cuando una persona respira, habla, canta, tose o estornuda.  Al tocar algo, como una mesa o el picaportes de [place], que estuvo expuesto al virus (contaminado) y luego tocarse la boca, nariz o los ojos. Qu incrementa el riesgo? Riesgo de infeccin Es ms probable que se infecte con este virus si:  Se encuentra a Physiological scientist a 6 pies (2 metros) de Arts administrator con COVID-19.  Cuida o  vive con una persona infectada con COVID-19.  Pasa tiempo en espacios interiores repletos de gente o vive en viviendas compartidas. Riesgo de enfermedad grave Es ms probable que se enferme gravemente por el virus si:  Tiene 50aos o ms. Cuanto mayor sea su edad, mayor ser el riesgo de tener una enfermedad grave.  Vive en un hogar de ancianos o centro de atencin a Barrister's clerk.  Tiene cncer.  Tiene una enfermedad prolongada (crnica), como las siguientes: ? Enfermedad pulmonar crnica, que incluye la enfermedad pulmonar obstructiva crnica o asma. ? Una enfermedad crnica que disminuye la capacidad del cuerpo para combatir las infecciones (immunocomprometido). ? Enfermedad cardaca, que incluye insuficiencia cardaca, una afeccin que se caracteriza porque las arterias que llegan al corazn se Investment banker, corporate u obstruyen (arteriopata coronaria) o una enfermedad que provoca que el msculo cardaco se engrose, se debilite o endurezca (miocardiopata). ? Diabetes. ? Enfermedad renal crnica. ? Anemia drepanoctica, una enfermedad que se caracteriza porque los glbulos rojos tienen una forma anormal de hoz. ? Enfermedad heptica.  Es obeso. Cules son los signos o sntomas? Los sntomas de esta afeccin pueden ser de leves a graves. Los sntomas pueden aparecer en el trmino de 2 a [address] despus de haber estado expuesto al virus. Incluyen los siguientes:  Fiebre o escalofros.  Tos.  Dificultad para respirar.  Dolores de [place], dolores en el cuerpo o dolores musculares.  Secrecin o congestin nasal.  Dolor de garganta.  Nueva prdida del sentido del gusto o del olfato. Algunas personas tambin pueden [employer], como nuseas, vmitos o diarrea. Es posible que otras personas no tengan sntomas de COVID-19.  Cmo se diagnostica? Esta afeccin se puede diagnosticar en funcin de lo siguiente:  Sus signos y sntomas, especialmente si: ? Vive en una zona  donde hay un brote de COVID-19. ? Viaj recientemente a una zona donde el virus es frecuente. ? Cuida o vive con Ardelia Mems persona a quien se le diagnostic COVID-19. ? [address] expuesto a una persona a la que se le diagnostic COVID-19.  Un examen fsico.  Anlisis de laboratorio que pueden incluir: ? Tomar una muestra de lquido de la parte posterior de la nariz y la garganta (lquido nasofarngeo), la nariz o la garganta, con un hisopo. ? Una muestra de mucosidad de los pulmones (esputo). ? Anlisis de [place].  Los estudios de diagnstico por imgenes pueden incluir radiografas, exploracin por tomografa computarizada (TC) o ecografa. Cmo se trata? En este momento, no hay ningn medicamento para tratar el COVID-19. Los medicamentos para tratar otras enfermedades se usan a modo de ensayo para comprobar si son eficaces contra el COVID-19. El mdico le informar sobre las maneras de tratar los sntomas. En la [employer], la infeccin es leve y puede controlarse en el hogar con reposo, lquidos y medicamentos de Congress. El tratamiento para una infeccin grave suele realizarse en la unidad de cuidados intensivos (UCI) de un hospital. Puede incluir uno o ms de los siguientes. Estos tratamientos se administran hasta que los sntomas mejoran.  Recibir lquidos y [employer] a travs de una va intravenosa.  Oxgeno complementario. Para administrar oxgeno extra, se [place] un tubo en la Doran [place], una mascarilla o una campana de oxgeno.  Colocarlo para que se recueste boca abajo (decbito prono). Esto facilita el ingreso de oxgeno a los pulmones.  Uso continuo de [place] de presin positiva de las vas areas (CPAP) o de presin positiva de las vas areas de dos niveles (BPAP). Este tratamiento utiliza una presin de aire leve para Theatre manager las vas respiratorias abiertas. Un tubo conectado a un motor administra oxgeno al cuerpo.  Respirador. Este tratamiento mueve el aire  dentro y fuera de los pulmones mediante el uso de un tubo que se coloca en la trquea.  Traqueostoma. En este procedimiento se hace un orificio en el cuello para insertar un tubo de respiracin.  Oxigenacin por membrana extracorprea (OMEC). En este procedimiento, los pulmones tienen la posibilidad de recuperarse al asumir las funciones del corazn y los pulmones. Suministra oxgeno al cuerpo y elimina el dixido de carbono. Siga estas instrucciones en su casa: Estilo de vida  Si est enfermo, qudese en su casa, excepto para obtener atencin mdica. El mdico le indicar cunto tiempo debe quedarse en casa. Llame al mdico antes de buscar atencin mdica.  Haga reposo en su casa como se lo haya indicado el mdico.  No consuma ningn producto que contenga nicotina o tabaco, como cigarrillos, cigarrillos electrnicos y tabaco de Higher education careers adviser. Si necesita ayuda para dejar de fumar, consulte al mdico.  Retome sus actividades normales segn lo indicado por el mdico. Pregntele al mdico qu actividades son seguras para usted. Instrucciones generales  Use los medicamentos de venta libre y los recetados solamente como se lo haya indicado el mdico.  Beba suficiente lquido como para Theatre manager la orina de color amarillo plido.  Concurra a todas las visitas de seguimiento como se lo haya indicado el mdico. Esto es importante. Cmo se evita?  No hay ninguna vacuna que ayude a prevenir la infeccin por COVID-19. Sin embargo, hay medidas que puede tomar para protegerse y Museum/gallery curator a  otras personas de [place] virus. Para protegerse:   No viaje a zonas donde el COVID-19 sea un riesgo. Las zonas donde se informa la presencia del COVID-19 cambian con frecuencia. Para identificar las zonas de alto riesgo y las restricciones de viaje, consulte el sitio web de viajes de Garment/textile technologist for [employer] and Prevention Librarian, academic) (Centros para el Control y la Prevencin de Arboriculturist):  FatFares.com.br  Si vive o debe viajar a una zona donde el COVID-19 es un riesgo, tome precauciones para evitar infecciones. ? Aljese de [place]. ? Lvese las manos frecuentemente con agua y [place]. Use desinfectante para manos con alcohol si no dispone de [place] y [place]. ? Evite tocarse la boca, la cara, los ojos o la [place]. ? Evite salir de su casa, siga las indicaciones de su estado y [place] autoridades sanitarias locales. ? Si debe salir de su casa, use un barbijo de tela o una mascarilla facial. Asegrese de que le cubra la nariz y la boca. ? Evite los espacios interiores repletos de gente. Mantenga una distancia de al menos 6 pies (2 metros) de Producer, television/film/video. ? Desinfecte los objetos y las superficies que se tocan con frecuencia todos [place]. Pueden incluir:  Encimeras y [place].  Picaportes e interruptores de luz.  Lavabos, fregaderos y grifos.  Aparatos electrnicos tales como telfonos, controles remotos, teclados, computadoras y tabletas. Cmo proteger a los dems: Si tiene sntomas de COVID-19, tome medidas para evitar que el virus se propague a Producer, television/film/video.  Si cree que tiene una infeccin por COVID-19, comunquese de inmediato con su mdico. Informe al equipo de atencin mdica que cree que puede tener una infeccin por el COVID-19.  Qudese en su casa. Salga de su casa solo para buscar atencin mdica. No utilice el transporte pblico.  No viaje mientras est enfermo.  Lvese las manos frecuentemente con agua y [place]. Usar desinfectante para manos con alcohol si no dispone de [place] y [place].  Mantngase alejado de quienes vivan con usted. Permita que los miembros de la familia sanos cuiden a los nios y las [place], si es posible. Si tiene que cuidar a los nios o las mascotas, lvese las manos con frecuencia y use un barbijo. Si es posible, permanezca en su habitacin, separado de los dems. Utilice un  bao diferente.  Asegrese de que todas las personas que viven en su casa se laven bien las manos y con frecuencia.  Tosa o estornude en un pauelo de papel o sobre su manga o codo. No tosa o estornude al aire ni se cubra la boca o la nariz con la [place].  Use un barbijo de tela o una mascarilla facial. Asegrese de que le cubra la nariz y la boca. Dnde buscar ms informacin  Centers for Disease Control and Prevention (Centros para el Control y la Prevencin de Arboriculturist): PurpleGadgets.be  World Health Organization (Organizacin Mundial de la Salud): https://www.castaneda.info/ Comunquese con un mdico si:  Vive o ha viajado a una zona donde el COVID-19 es un riesgo y tiene sntomas de infeccin.  Ha tenido contacto con alguien que tiene COVID-19 y usted tiene sntomas de infeccin. Solicite ayuda inmediatamente si:  Tiene dificultad para respirar.  Siente dolor u opresin en el pecho.  Experimenta confusin.  [place] uas de los dedos y los labios de color [place].  Tiene dificultad para despertarse.  Los sntomas empeoran. Estos sntomas pueden representar un problema grave que constituye Engineer, maintenance (IT). No espere  a ver si los sntomas desaparecen. Solicite atencin mdica de inmediato. Comunquese con el servicio de emergencias de su localidad (911 en los [place]). No conduzca por sus propios medios [employer] hospital. Informe al personal mdico de emergencias si cree que tiene COVID-19. Resumen  El COVID-19 es una infeccin respiratoria causada por un virus. Tambin se conoce como enfermedad por coronavirus o nuevo coronavirus. Puede causar infecciones graves, como neumona, sndrome de dificultad respiratoria aguda, insuficiencia respiratoria aguda o sepsis.  El virus que causa el COVID-19 es contagioso. Esto significa que puede transmitirse de [place] persona a otra a travs de las gotitas que se despiden al respirar, Electrical engineer,  cantar, toser y Brewing technologist.  Es ms probable que desarrolle una enfermedad grave si tiene 70 aos o ms, tiene el sistema inmunitario dbil, vive en un hogar de ancianos o tiene una enfermedad crnica.  No hay ningn medicamento para tratar el COVID-19. El mdico le informar sobre las maneras de tratar los sntomas.  Tome medidas para protegerse y Museum/gallery curator a los Location manager las infecciones. Lvese las manos con frecuencia y desinfecte los objetos y las superficies que se tocan con frecuencia todos [place]. Mantngase alejado de las personas que estn enfermas y use un barbijo si est enfermo. Esta informacin no tiene Marine scientist el consejo del mdico. Asegrese de hacerle al mdico cualquier pregunta que tenga. Document Revised: 10/06/2019 Document Reviewed: 01/29/2019 Elsevier Patient Education  2020 [place] infecciones en el hogar Infection Prevention in the Home Si tiene una infeccin, puede haberse expuesto a una infeccin o est cuidando a alguien que tiene una infeccin, es importante saber cmo evitar que la infeccin se propague. Siga las instrucciones del mdico y haga uso de estas pautas para evitar la propagacin de la infeccin. Cmo se propagan las infecciones Para que una infeccin se propague, debe existir lo siguiente:  Un germen. Esto puede ser un virus, una bacteria, un hongo o un parsito.  Un lugar donde viva el germen. Esto puede ser lo siguiente: ? En una persona, animal, planta o alimento. ? En el suelo o en el agua. ? En superficies como la manija de [place].  Ardelia Mems persona o animal que pueda desarrollar una enfermedad si el germen ingresa en su cuerpo (anfitrin). El anfitrin no tiene resistencia al germen.  Una manera de que el germen ingrese al anfitrin. Esto puede ocurrir de las siguientes maneras: ? Por contacto directo con una persona o Lexicographer. Esto puede suceder al darse la mano o abrazarse. Algunos grmenes  tambin pueden desplazarse a travs del aire y propagarse a Producer, television/film/video. Esto puede ocurrir cuando una persona infectada tose o estornuda sobre o cerca de Producer, television/film/video. ? Contacto indirecto. Esto sucede cuando el germen ingresa en el anfitrin a travs del contacto con un objeto infectado. Por ejemplo:  Ingerir o beber alimentos o agua que tengan el germen (estn contaminados).  Al tocar una superficie contaminada con las manos y [employer] mano a la cara, la boca, la nariz o los ojos. Materiales necesarios:  Jabn.  Desinfectante para manos a base de alcohol.  Productos de Gaffer.  Desinfectantes, como leja.  Toallas de papel o paos de limpieza o esponjas reutilizables.  Guantes de trabajo desechables o reutilizables. Cmo evitar que la infeccin se propague Hay varias cosas que puede hacer para prevenir la propagacin de la infeccin. Tome estas medidas generales Todas las personas deben tomar las siguientes medidas para  evitar la propagacin de la infeccin:  Lvese las manos regularmente con agua y jabn durante al menos 20segundos. Use desinfectante para manos con alcohol si no dispone de [place] y [place].  Evitar tocarse la cara, la boca, la nariz y los ojos.  Tosa o estornude en un pauelo de papel o en su manga o codo en lugar de hacerlo en la mano o en el aire. ? Si tose o estornuda en un pauelo de papel, deschelo inmediatamente y [employer].  Mantenga el bao limpio  Ponga jabn.  Navesink y las toallitas de mano con frecuencia.  Cambiar los cepillos de dientes a menudo y guardarlos por separado en un lugar limpio y Radiographer, therapeutic.  Limpie y desinfecte todas las superficies; entre ellas, el inodoro, el piso, la baera, la ducha y el lavabo.  No comparta elementos personales, como afeitadoras, cepillos de dientes, desodorantes, peines, cepillos, toallas y toallitas de mano. Mantenga la higiene en la cocina   Lvese las manos antes y  despus de preparar los alimentos y antes de comer.  Limpiar el interior del refrigerador todas las semanas.  Mantener el refrigerador en 76F (4C) o menos y el congelador a una temperatura de 70F (-18C) o menos.  [place] superficies de trabajo limpias. Desinfctelas peridicamente.  Lavar la vajilla con agua caliente y [place]. Secar la vajilla al aire o usar un lavavajillas.  No comparta platos ni utensilios para comer. Manipule los alimentos de [employer].  Almacene los alimentos cuidadosamente.  Refrigere las sobras de inmediato en recipientes con tapa.  Tire alimentos rancios o en mal estado.  Descongele los alimentos en el refrigerador o el microondas, no a Engineer, water.  Sirva los alimentos a la Development worker, community. No coma carne cruda. Asegrese de [employer] Materials carne a la temperatura adecuada. Cocine los [employer] estn firmes.  [place] frutas y las verduras debajo del agua corriente.  Use tablas de corte, platos y utensilios distintos para alimentos crudos y alimentos cocidos.  Use una cuchara limpia cada vez que prueba la comida mientras cocina. Lave la ropa de la manera correcta  Use guantes si la ropa est visiblemente sucia.  No sacuda la ropa sucia. Hacerlo puede esparcir los grmenes por el aire.  Lave la ropa con agua caliente.  Si no puede lavar la ropa de inmediato, colquela en una bolsa plstica y lvela lo antes posible. Tenga cuidado con los [place] y Copy.  Lvese las manos antes y despus de Engineer, manufacturing systems.  Si tiene [employer], asegrese de [place] limpia. No permita que personas con un sistema inmunitario dbil toquen excremento de pjaros, el agua de la pecera ni bandejas sanitarias. ? Si tiene [place] para mascotas o una caja de arena, asegrese de [employer].  Si est enfermo, mantngase alejado de los [place] y pdale a otra persona que los cuide si es posible. Cmo limpiar y desinfectar  objetos y superficies Precauciones  Algunos desinfectantes funcionan con ciertos grmenes y no con otros. Lea las instrucciones del fabricante o lea los recursos en lnea para determinar si el producto que est usando funcionar con el germen que usted trata de Radiographer, therapeutic.  Si elige emplear leja, sela de manera segura. Nunca los Eastman Kodak con otros productos de limpieza, en especial, si contienen amonaco. Esta mezcla puede producir un gas peligroso que puede ser mortal.  Mantenga un movimiento adecuado de aire fresco en su casa (ventilacin).  Vierta el agua que [place] para trapear  en el lavabo de servicio o en el inodoro. No vierta esa agua en el fregadero de la cocina. Objetos y [place] superficies estn visiblemente sucias, lmpielas primero con agua y jabn antes de desinfectarlas.  Desinfecte las superficies que se tocan con frecuencia [employer]. Pueden incluir: ? Encimeras. ? Mes[place]. ? Picaportes. ? Lavabos, fregaderos y grifos. ? Dispositivos electrnicos, como:  Telfonos.  Controles remotos.  Teclados.  Computadoras y tablets. Suministros de limpieza Algunos suministros de limpieza pueden cultivar grmenes. Cudelos bien para evitar que los grmenes se propaguen. Para hacer esto:  Remoje cepillos de inodoro, trapeadores y esponjas en leja y agua durante 96minutos despus de cada uso, o segn las instrucciones del fabricante.  Lave los paos de limpieza reutilizables y desinfecte las esponjas despus de cada uso.  Deseche los guantes desechables despus de un uso.  Reemplace los guantes reutilizables si estn rotos o rasgados o si se empiezan a pelar. Medidas adicionales si est enfermo Si vive con otras personas:   Evite el contacto cercano con las personas que lo rodean. Permanezca a una distancia de al menos 3 pies (1 m) de las [employer], si es posible.  Use un bao aparte, si es posible.  De ser posible, duerma en un dormitorio aparte o en  una cama aparte para evitar infectar a otros miembros de la familia. ? Agricultural engineer ropa blanca de los dormitorios todas las semanas o cuando est sucia.  Haga que todos los integrantes del hogar se laven las manos con agua y jabn con frecuencia. Use desinfectante para manos con alcohol si no dispone de [place] y [place]. En general:  Qudese en su casa, excepto para obtener atencin mdica. Llame con anticipacin antes de visitar al mdico.  Pdales a otras personas que le hagan la compra del supermercado y los suministros para Engineer, mining, y que surtan las recetas de sus medicamentos.  Evite las zonas pblicas. Trate de no viajar en transporte pblico.  Si puede, use una mascarilla si debe salir de la casa o si est en contacto cercano con alguien que no est enfermo.  No reciba visitas hasta que se haya recuperado completamente o hasta que no tenga signos ni sntomas de infeccin.  Evite preparar alimentos o cuidar a Producer, television/film/video. Si debe preparar alimentos o cuidar a Producer, television/film/video, use Geographical information systems officer y [employer] antes y despus de Loss adjuster, chartered. Dnde buscar ms informacin  Centers for Disease Control and Prevention (Centros para el Control y la Prevencin de [place]): PromotionalReview.nl.  Organizacin [place] (OMS): PrintStats.tn  Association for Professionals in Infection Control and Epidemiology (Asociacin de profesionales del control de infecciones y epidemiologa): professionals.site.StadiumBlog.se Resumen  Es importante saber cmo evitar que la infeccin se propague.  Asegrese de que en su hogar todos se laven las manos con agua y jabn con frecuencia.  Desinfecte las superficies que se tocan con frecuencia [employer].  Si est enfermo, qudese en su casa, excepto para obtener atencin mdica. Esta informacin no tiene Marine scientist el  consejo del mdico. Asegrese de hacerle al mdico cualquier pregunta que tenga. Document Released: 11/13/2008 Document Revised: 03/14/2019 Document Reviewed: 03/14/2019 Elsevier Patient Education  [place].

## 2020-08-14 NOTE — Progress Notes (Signed)
Patient complains of exposure to a positive patient. Patient complains of sore throat, congestion for the past 2 days. Patient denies pain N/V diarrhea. Patient

## 2020-08-14 NOTE — Progress Notes (Signed)
Established Patient Office Visit  Subjective:  Patient ID: [name], female    DOB: [birth year]  Age: 39 y.o. MRN: [id number]  CC:  Chief Complaint  Patient presents with  . Covid Exposure    Virtual Visit via Telephone Note  I connected with [name] on 08/14/20 at  3:30 PM EDT by telephone and verified that I am speaking with the correct person using two identifiers.  Location: Patient: [name] Parking Lot Provider: [facility]    I discussed the limitations, risks, security and privacy concerns of performing an evaluation and management service by telephone and the availability of in person appointments. I also discussed with the patient that there may be a patient responsible charge related to this service. The patient expressed understanding and agreed to proceed.   History of Present Illness: States that she started having cold sxs   approximately 2 days ago.  States that she did have close contact with a person who tested positive for Covid. States that she has tried ibuprofen and nyquil with relief Has been able to eat and drink Has children at home, no one is sick at home No covid vaccine    Due to language barrier, an interpreter was present during the history-taking and subsequent discussion (and for part of the physical exam) with this patient.    Observations/Objective: Medical history and current medications reviewed, no physical exam completed   Past Medical History:  Diagnosis Date  . Carpal tunnel syndrome on both sides 01/2016  . Headache    "seasonal"  . Headache syndrome 05/26/2019  . Non-insulin dependent type 2 diabetes mellitus ([place])    states only uses Insulin as needed  . Pyelonephritis 11/23/2019    Past Surgical History:  Procedure Laterality Date  . BILATERAL CARPAL TUNNEL RELEASE Bilateral 02/06/2016   Procedure: BILATERAL CARPAL TUNNEL RELEASE;  Surgeon: Leandrew Koyanagi, MD;   Location: [place];  Service: Orthopedics;  Laterality: Bilateral;  . NO PAST SURGERIES      Family History  Problem Relation Age of Onset  . Diabetes Neg Hx   . Hypertension Neg Hx   . Hyperlipidemia Neg Hx     Social History   Socioeconomic History  . Marital status: Married    Spouse name: Not on file  . Number of children: Not on file  . Years of education: Not on file  . Highest education level: Not on file  Occupational History  . Not on file  Tobacco Use  . Smoking status: Never Smoker  . Smokeless tobacco: Never Used  Vaping Use  . Vaping Use: Never assessed  Substance and Sexual Activity  . Alcohol use: No  . Drug use: No  . Sexual activity: Yes  Other Topics Concern  . Not on file  Social History Narrative  . Not on file   Social Determinants of Health   Financial Resource Strain:   . Difficulty of Paying Living Expenses: Not on file  Food Insecurity:   . Worried About Charity fundraiser in the Last Year: Not on file  . Ran Out of Food in the Last Year: Not on file  Transportation Needs:   . Lack of Transportation (Medical): Not on file  . Lack of Transportation (Non-Medical): Not on file  Physical Activity:   . Days of Exercise per Week: Not on file  . Minutes of Exercise per Session: Not on file  Stress:   .  Feeling of Stress : Not on file  Social Connections:   . Frequency of Communication with Friends and Family: Not on file  . Frequency of Social Gatherings with Friends and Family: Not on file  . Attends Religious Services: Not on file  . Active Member of Clubs or Organizations: Not on file  . Attends Archivist Meetings: Not on file  . Marital Status: Not on file  Intimate Partner Violence:   . Fear of Current or Ex-Partner: Not on file  . Emotionally Abused: Not on file  . Physically Abused: Not on file  . Sexually Abused: Not on file    Outpatient Medications Prior to Visit  Medication Sig Dispense Refill   . Blood Glucose Monitoring Suppl (TRUE METRIX METER) w/Device KIT Use as instructed. Check blood glucose levels by fingerstick once per day. 1 kit 0  . glucose blood (TRUE METRIX BLOOD GLUCOSE TEST) test strip Use as instructed. Check blood glucose levels by fingerstick once per day. 100 each 12  . ibuprofen (ADVIL) 600 MG tablet Take 1 tablet (600 mg total) by mouth every 6 (six) hours as needed for headache. 30 tablet 0  . Levonorgestrel-Ethinyl Estradiol (AMETHIA) 0.15-0.03 &0.01 MG tablet Take 1 tablet by mouth daily. 28 tablet 0  . TRUEplus Lancets 28G MISC Use as instructed. Check blood glucose levels by fingerstick once per day. 100 each 3   No facility-administered medications prior to visit.    No Known Allergies  ROS Review of Systems  Constitutional: Negative for chills, fatigue and fever.  HENT: Positive for congestion, rhinorrhea and sneezing. Negative for sinus pressure and sinus pain.   Eyes: Negative.   Respiratory: Positive for cough. Negative for shortness of breath and wheezing.   Cardiovascular: Negative.   Gastrointestinal: Negative for abdominal pain, nausea and vomiting.  Endocrine: Negative.   Genitourinary: Negative.   Musculoskeletal: Negative.   Skin: Negative.   Allergic/Immunologic: Negative.   Neurological: Negative.   Hematological: Negative.   Psychiatric/Behavioral: Negative.       Objective:     There were no vitals taken for this visit. Wt Readings from Last 3 Encounters:  11/29/18 141 lb 9.6 oz (64.2 kg)  11/19/18 142 lb (64.4 kg)  09/15/18 143 lb 12.8 oz (65.2 kg)     Health Maintenance Due  Topic Date Due  . PNEUMOCOCCAL POLYSACCHARIDE VACCINE AGE 38-64 HIGH RISK  Never done  . OPHTHALMOLOGY EXAM  Never done  . COVID-19 Vaccine (1) Never done  . TETANUS/TDAP  Never done  . FOOT EXAM  03/18/2018  . HEMOGLOBIN A1C  05/21/2019  . URINE MICROALBUMIN  08/24/2019  . INFLUENZA VACCINE  07/15/2020    There are no preventive care  reminders to display for this patient.  No results found for: TSH Lab Results  Component Value Date   WBC 7.7 11/25/2019   HGB 11.2 (L) 11/25/2019   HCT 35.7 (L) 11/25/2019   MCV 81.5 11/25/2019   PLT 342 11/25/2019   Lab Results  Component Value Date   NA 139 11/25/2019   K 3.4 (L) 11/25/2019   CO2 20 (L) 11/25/2019   GLUCOSE 98 11/25/2019   BUN 5 (L) 11/25/2019   CREATININE 0.81 11/25/2019   BILITOT 0.4 11/23/2019   ALKPHOS 59 11/23/2019   AST 15 11/23/2019   ALT 12 11/23/2019   PROT 7.9 11/23/2019   ALBUMIN 3.6 11/23/2019   CALCIUM 8.5 (L) 11/25/2019   ANIONGAP 11 11/25/2019   Lab Results  Component Value  Date   CHOL 182 08/23/2018   Lab Results  Component Value Date   HDL 51 08/23/2018   Lab Results  Component Value Date   LDLCALC 105 (H) 08/23/2018   Lab Results  Component Value Date   TRIG 130 08/23/2018   Lab Results  Component Value Date   CHOLHDL 3.6 08/23/2018   Lab Results  Component Value Date   HGBA1C 5.8 (A) 11/19/2018      Assessment & Plan:   Problem List Items Addressed This Visit      Other   COVID-19 virus detected    Other Visit Diagnoses    Close exposure to COVID-19 virus    -  Primary   Relevant Orders   POC COVID-19 (Completed)   Novel Coronavirus, NAA ([facility])      Assessment and Plan: 1. Close exposure to COVID-19 virus Rapid test positive. Patient evaluated, tested and sent home with instructions for home care and Quarantine. Instructed to seek further care if symptoms worsen.  Is set up with follow up for a virtual visit with [facility] in one week. .  - POC COVID-19 - Novel Coronavirus, NAA ([facility])  2. COVID-19 virus detected    Follow Up Instructions:    I discussed the assessment and treatment plan with the patient. The patient was provided an opportunity to ask questions and all were answered. The patient agreed with the plan and demonstrated an understanding of the  instructions.   The patient was advised to call back or seek an in-person evaluation if the symptoms worsen or if the condition fails to improve as anticipated.  I provided 21 minutes of non-face-to-face time during this encounter.   Monifa Blanchette S Mayers, PA-C    No orders of the defined types were placed in this encounter.   Follow-up: Return in about 1 week (around 08/21/2020).    Loraine Grip Mayers, PA-C

## 2020-08-16 ENCOUNTER — Telehealth: Payer: Self-pay | Admitting: *Deleted

## 2020-08-16 LAB — NOVEL CORONAVIRUS, NAA: SARS-CoV-2, NAA: DETECTED — AB

## 2020-08-16 NOTE — Telephone Encounter (Signed)
Medical Assistant used [employer] to contact patient.  Interpreter Name: Myrla Halsted #: [id number] Patient is aware her PCR being positive and needing to adhere to quarantine instructions.

## 2020-08-16 NOTE — Telephone Encounter (Signed)
-----   Message from Kennieth Rad, [place] sent at 08/16/2020  8:43 AM EDT ----- Please call and let them know that her PCR Covid testing was positive, please encourage her to keep her virtual follow-up for next week.  They do need to continue their quarantine at this time

## 2020-08-19 ENCOUNTER — Ambulatory Visit
Admission: EM | Admit: 2020-08-19 | Discharge: 2020-08-19 | Disposition: A | Discharge status: Nursing Home (Includes ICF) | Payer: HRSA Program

## 2020-08-19 ENCOUNTER — Other Ambulatory Visit: Payer: Self-pay | Admitting: Physician Assistant

## 2020-08-19 ENCOUNTER — Emergency Department (HOSPITAL_COMMUNITY)
Admission: EM | Admit: 2020-08-19 | Discharge: 2020-08-19 | Disposition: A | Discharge status: Home / Self Care | Payer: HRSA Program | Attending: Emergency Medicine | Admitting: Emergency Medicine

## 2020-08-19 ENCOUNTER — Emergency Department (HOSPITAL_COMMUNITY): Payer: Self-pay

## 2020-08-19 ENCOUNTER — Encounter (HOSPITAL_COMMUNITY): Payer: Self-pay

## 2020-08-19 ENCOUNTER — Encounter: Payer: Self-pay | Admitting: *Deleted

## 2020-08-19 ENCOUNTER — Other Ambulatory Visit: Payer: Self-pay

## 2020-08-19 ENCOUNTER — Emergency Department (HOSPITAL_COMMUNITY)
Admission: EM | Admit: 2020-08-19 | Discharge: 2020-08-19 | Disposition: A | Discharge status: Home / Self Care | Payer: Self-pay | Attending: Emergency Medicine | Admitting: Emergency Medicine

## 2020-08-19 DIAGNOSIS — Z79899 Other long term (current) drug therapy: Secondary | ICD-10-CM | POA: Diagnosis not present

## 2020-08-19 DIAGNOSIS — U071 COVID-19: Secondary | ICD-10-CM | POA: Insufficient documentation

## 2020-08-19 DIAGNOSIS — R0602 Shortness of breath: Secondary | ICD-10-CM | POA: Insufficient documentation

## 2020-08-19 DIAGNOSIS — J1282 Pneumonia due to coronavirus disease 2019: Secondary | ICD-10-CM | POA: Diagnosis not present

## 2020-08-19 DIAGNOSIS — Z5321 Procedure and treatment not carried out due to patient leaving prior to being seen by health care provider: Secondary | ICD-10-CM | POA: Insufficient documentation

## 2020-08-19 DIAGNOSIS — E119 Type 2 diabetes mellitus without complications: Secondary | ICD-10-CM

## 2020-08-19 DIAGNOSIS — E1165 Type 2 diabetes mellitus with hyperglycemia: Secondary | ICD-10-CM | POA: Diagnosis not present

## 2020-08-19 DIAGNOSIS — R05 Cough: Secondary | ICD-10-CM | POA: Diagnosis present

## 2020-08-19 DIAGNOSIS — Z6827 Body mass index (BMI) 27.0-27.9, adult: Secondary | ICD-10-CM

## 2020-08-19 DIAGNOSIS — J9601 Acute respiratory failure with hypoxia: Secondary | ICD-10-CM

## 2020-08-19 HISTORY — DX: COVID-19: U07.1

## 2020-08-19 HISTORY — DX: Pneumonia due to coronavirus disease 2019: J12.82

## 2020-08-19 MED ORDER — AEROCHAMBER PLUS FLO-VU LARGE MISC: 1.0000 | Freq: Once | Status: DC

## 2020-08-19 MED ORDER — BENZONATATE 100 MG PO CAPS: 100.0000 mg | ORAL_CAPSULE | Freq: Three times a day (TID) | ORAL | 0 refills | Status: DC

## 2020-08-19 MED ORDER — ACETAMINOPHEN 325 MG PO TABS
650.0000 mg | ORAL_TABLET | Freq: Once | ORAL | Status: AC | PRN
  Administered 2020-08-19: 650 mg via ORAL
  Filled 2020-08-19: qty 2

## 2020-08-19 MED ORDER — AEROCHAMBER PLUS FLO-VU LARGE MISC
Status: AC
  Filled 2020-08-19: qty 1

## 2020-08-19 MED ORDER — ALBUTEROL SULFATE HFA 108 (90 BASE) MCG/ACT IN AERS
2.0000 | INHALATION_SPRAY | Freq: Once | RESPIRATORY_TRACT | Status: AC
  Administered 2020-08-19: 2 via RESPIRATORY_TRACT
  Filled 2020-08-19: qty 6.7

## 2020-08-19 NOTE — ED Notes (Signed)
Pt d/c By MD, Pt is provided w/ d/c instructions and follow up care, Pt is out of the ED AMBULATORY WITH FAMILY

## 2020-08-19 NOTE — ED Provider Notes (Addendum)
40 year old female who is Covid positive x1 week comes in for worsening shortness of breath.  Continues with cough and fever.  She was at the emergency department earlier today, T-max 103, was given antipyretic.  Patient tachypneic at 30-40 breaths/min. O2 sat 89-91% on room air. HR 106, BP 107/63, temp 98.3 Patient was placed on 2 L oxygen, with O2 saturation ranging 94 to 96%.  However, continues to be tachypneic at 30 to 40 breaths/min, and unable to speak in full sentences.  Hear: tachycardic, regular rhythm Lungs: unable to speak in full sentences, increase work of breathing without acc muscle use. Shallow breaths. LCTAB  Chest x-ray done at the ED 1 view with multifocal pneumonia.  Due to Covid pneumonia, new oxygen demand, patient offered EMS transport to the ED for further evaluation.  Patient currently declines EMS transport. Family member will transport patient to the ED. Discharged in stable condition.    Ok Edwards, PA-C 08/19/20 1319  During discharge, patient states would like EMS transport instead. EMS contacted for transport. Discharged in stable condition.   Ok Edwards, PA-C 08/19/20 1320

## 2020-08-19 NOTE — Discharge Instructions (Signed)
You do in fact have what appears to be Covid infection in your lungs.  This is why you are having shortness of breath, headaches, weakness and muscle aches.  You may use the Tessalon, 1 tablet every 8 hours as needed for coughing.  I have given you a albuterol inhaler which may help with shortness of breath and coughing, take 2 puffs every 4 hours as needed.  I would also like for you to have the infusion of antibodies.  You will be contacted tomorrow by the clinic, they will tell you where to go and what time, this will help you to get through this faster.  In the meantime if you should develop severe or worsening symptoms including chest pain coughing shortness of breath weakness or any other severe worsening symptoms return to the emergency department immediately.  Tylenol or ibuprofen as needed for fever.

## 2020-08-19 NOTE — ED Notes (Signed)
Upon returning to room to discharge pt for private vehicle transport to ED, pt states she wishes to be transported to ED via EMS.  Stark Klein, PA notified.

## 2020-08-19 NOTE — ED Triage Notes (Signed)
Pt left [facility] to come to [facility] due to long wait in ED.  C/O increased SOB, worsening over past week.

## 2020-08-19 NOTE — ED Triage Notes (Signed)
Patient arrived from [facility] for ongoing covid. Patient left ED earlier prior to being seen. Pneumonia, fever and sats 89-91. Alert and oriented, states that she feels better after tylenol

## 2020-08-19 NOTE — ED Notes (Signed)
CALLED x 2 NO answer

## 2020-08-19 NOTE — Progress Notes (Signed)
I connected by phone with [name] on 08/19/2020 at 8:24 PM to discuss the potential use of a new treatment for mild to moderate COVID-19 viral infection in non-hospitalized patients.  This patient is a 39 y.o. female that meets the FDA criteria for Emergency Use Authorization of COVID monoclonal antibody casirivimab/imdevimab.  Has a (+) direct SARS-CoV-2 viral test result  Has mild or moderate COVID-19   Is NOT hospitalized due to COVID-19  Is within 10 days of symptom onset  Has at least one of the high risk factor(s) for progression to severe COVID-19 and/or hospitalization as defined in EUA.  Specific high risk criteria : BMI > 25 and Diabetes   I have spoken and communicated the following to the patient or parent/caregiver regarding COVID monoclonal antibody treatment:  1. FDA has authorized the emergency use for the treatment of mild to moderate COVID-19 in adults and pediatric patients with positive results of direct SARS-CoV-2 viral testing who are 19 years of age and older weighing at least 40 kg, and who are at high risk for progressing to severe COVID-19 and/or hospitalization.  2. The significant known and potential risks and benefits of COVID monoclonal antibody, and the extent to which such potential risks and benefits are unknown.  3. Information on available alternative treatments and the risks and benefits of those alternatives, including clinical trials.  4. Patients treated with COVID monoclonal antibody should continue to self-isolate and use infection control measures (e.g., wear mask, isolate, social distance, avoid sharing personal items, clean and disinfect "high touch" surfaces, and frequent handwashing) according to CDC guidelines.   5. The patient or parent/caregiver has the option to accept or refuse COVID monoclonal antibody treatment.  After reviewing this information with the patient, The patient agreed to proceed with receiving  casirivimab\imdevimab infusion and will be provided a copy of the Fact sheet prior to receiving the infusion.  Sx onset 8/29. Set up for infusion on 9/6 @ 12:30pm. Directions given to [facility]. Pt is aware that insurance will be charged an infusion fee. Pt is not vaccinated.   Angelena Form 08/19/2020 8:24 PM

## 2020-08-19 NOTE — ED Notes (Signed)
Pt placed on 2L O2 [place] per RN, Gilberto Better.

## 2020-08-19 NOTE — Discharge Instructions (Addendum)
39 year old female who is Covid positive x1 week comes in for worsening shortness of breath.  Continues with cough and fever.  She was at the emergency department earlier today, T-max 103, was given antipyretic.  Patient tachypneic at 30-40 breaths/min. O2 sat 89-91% on room air. HR 106, BP 107/63, temp 98.3 Patient was placed on 2 L oxygen, with O2 saturation ranging 94 to 96%.  However, continues to be tachypneic at 30 to 40 breaths/min, and unable to speak in full sentences.  Hear: tachycardic, regular rhythm Lungs: unable to speak in full sentences, increase work of breathing without acc muscle use. Shallow breaths. LCTAB  Chest x-ray done at the ED 1 view with multifocal pneumonia.  Due to Covid pneumonia, new oxygen demand, patient offered EMS transport to the ED for further evaluation.  Patient currently declines EMS transport. Family member will transport patient to the ED. Discharged in stable condition.

## 2020-08-19 NOTE — ED Notes (Signed)
Report given to EMS

## 2020-08-19 NOTE — ED Notes (Signed)
Attempt x1 to establish peripheral IV without success.

## 2020-08-19 NOTE — ED Notes (Signed)
[facility] notified of need for pt transport.

## 2020-08-19 NOTE — ED Notes (Signed)
SaO2 89-92%RA. Pt placed on O2 @ 2L via N/C.  States had been placed on O2 in ED as well.

## 2020-08-19 NOTE — ED Notes (Signed)
Patient is being discharged from the Urgent Care and sent to the Emergency Department via [facility] . Per Stark Klein, PA, patient is in need of higher level of care due to Covid pneumonia with hypoxia. Patient is aware and verbalizes understanding of plan of care.  Vitals:   08/19/20 1245 08/19/20 1252  BP:    Pulse:    Resp: (!) 30   Temp:    SpO2: 90% 94%

## 2020-08-19 NOTE — ED Triage Notes (Signed)
DX Thursday with Covid and increased SOB with chest pressure from cough. Pt said fevers and cough too

## 2020-08-19 NOTE — ED Provider Notes (Signed)
[facility] EMERGENCY DEPARTMENT Provider Note   CSN: [id number] Arrival date & time: 08/19/20  1428     History No chief complaint on file.   [name] is a 39 y.o. female.  HPI   Patient is a 39 year old female, has a history of diabetes, she was recently diagnosed with Covid 19 pneumonia, has been symptomatic for approximately 7 days and presents today with a complaint of ongoing mild cough, ongoing shortness of breath, headache and feeling some discomfort in her chest.  Though this has been persistent and seem to be get a little bit worse over the last couple of days.  She has had a headache with this, minimal sore throat, no nausea or vomiting, one episode of diarrhea.  She still has sense of taste and smell.  She has sick contacts at home, no abdominal pain, no swelling of the legs, no rashes.  She was seen at the urgent care prior to arrival, had some documented low oxygen's that she was sent to the hospital after having an x-ray which showed a possible Covid pneumonia.  Chart review completed, Covid sample positive on September 2  Past Medical History:  Diagnosis Date  . Carpal tunnel syndrome on both sides 01/2016  . Headache    "seasonal"  . Headache syndrome 05/26/2019  . Non-insulin dependent type 2 diabetes mellitus ([place])    states only uses Insulin as needed  . Pneumonia due to COVID-19 virus   . Pyelonephritis 11/23/2019    Patient Active Problem List   Diagnosis Date Noted  . COVID-19 virus detected 08/14/2020  . Pyelonephritis 11/24/2019  . Lymphadenopathy   . Headache syndrome 05/26/2019  . DM type 2 (diabetes mellitus, type 2) ([place]) 08/02/2015  . Hydronephrosis 08/10/2014  . Hypokalemia 08/10/2014  . Nausea and vomiting 08/10/2014  . Pyelonephritis, acute 08/10/2014  . Hyperglycemia 08/10/2014  . DUB (dysfunctional uterine bleeding) 07/14/2014  . Uterine fibroid 07/14/2014    Past Surgical History:  Procedure  Laterality Date  . BILATERAL CARPAL TUNNEL RELEASE Bilateral 02/06/2016   Procedure: BILATERAL CARPAL TUNNEL RELEASE;  Surgeon: Leandrew Koyanagi, MD;  Location: [facility];  Service: Orthopedics;  Laterality: Bilateral;  . NO PAST SURGERIES       OB History    Gravida  5   Para  4   Term  2   Preterm  2   AB  0   Living  3     SAB      TAB      Ectopic      Multiple      Live Births  2           Family History  Problem Relation Age of Onset  . Healthy Mother   . Healthy Father   . Diabetes Neg Hx   . Hypertension Neg Hx   . Hyperlipidemia Neg Hx     Social History   Tobacco Use  . Smoking status: Never Smoker  . Smokeless tobacco: Never Used  Vaping Use  . Vaping Use: Never used  Substance Use Topics  . Alcohol use: No  . Drug use: No    Home Medications Prior to Admission medications   Medication Sig Start Date End Date Taking? Authorizing Provider  benzonatate (TESSALON) 100 MG capsule Take 1 capsule (100 mg total) by mouth every 8 (eight) hours. 08/19/20   Noemi Chapel, MD  Blood Glucose Monitoring Suppl (TRUE METRIX METER) w/Device KIT Use  as instructed. Check blood glucose levels by fingerstick once per day. 08/23/18   [name], NP  glucose blood (TRUE METRIX BLOOD GLUCOSE TEST) test strip Use as instructed. Check blood glucose levels by fingerstick once per day. 05/13/19   [name], NP  ibuprofen (ADVIL) 600 MG tablet Take 1 tablet (600 mg total) by mouth every 6 (six) hours as needed for headache. 11/26/19   Desiree Hane, MD  Levonorgestrel-Ethinyl Estradiol (AMETHIA) 0.15-0.03 &0.01 MG tablet Take 1 tablet by mouth daily. 07/26/20   Charlott Rakes, MD  TRUEplus Lancets 28G MISC Use as instructed. Check blood glucose levels by fingerstick once per day. 05/13/19   [name], NP    Allergies    Patient has no known allergies.  Review of Systems   Review of Systems  All other systems reviewed and are  negative.   Physical Exam Updated Vital Signs BP 119/71 (BP Location: Left Arm)   Pulse 98   Temp 98.4 F (36.9 C) (Oral)   Resp 19   SpO2 96%   Physical Exam Vitals and nursing note reviewed.  Constitutional:      General: She is not in acute distress.    Appearance: She is well-developed.  HENT:     Head: Normocephalic and atraumatic.     Mouth/Throat:     Pharynx: No oropharyngeal exudate.  Eyes:     General: No scleral icterus.       Right eye: No discharge.        Left eye: No discharge.     Conjunctiva/sclera: Conjunctivae normal.     Pupils: Pupils are equal, round, and reactive to light.  Neck:     Thyroid: No thyromegaly.     Vascular: No JVD.  Cardiovascular:     Rate and Rhythm: Normal rate and regular rhythm.     Heart sounds: Normal heart sounds. No murmur heard.  No friction rub. No gallop.      Comments: Rate of 95 Pulmonary:     Effort: Pulmonary effort is normal. No respiratory distress.     Breath sounds: Rales present. No wheezing.     Comments: Scattered rales, clear with deep breathing, speaks in full sentences, occasional coughing Abdominal:     General: Bowel sounds are normal. There is no distension.     Palpations: Abdomen is soft. There is no mass.     Tenderness: There is no abdominal tenderness.  Musculoskeletal:        General: No tenderness. Normal range of motion.     Cervical back: Normal range of motion and neck supple.  Lymphadenopathy:     Cervical: No cervical adenopathy.  Skin:    General: Skin is warm and dry.     Findings: No erythema or rash.  Neurological:     Mental Status: She is alert.     Coordination: Coordination normal.  Psychiatric:        Behavior: Behavior normal.     ED Results / Procedures / Treatments   Labs (all labs ordered are listed, but only abnormal results are displayed) Labs Reviewed - No data to display  EKG None  Radiology DG Chest Portable 1 View  Result Date: 08/19/2020 CLINICAL  DATA:  Chest pain EXAM: PORTABLE CHEST 1 VIEW COMPARISON:  None. FINDINGS: The heart size and mediastinal contours are within normal limits. Hazy patchy airspace opacity seen at the periphery of the left lower lung and right mid lung. There is also streaky  airspace opacity at the right lung base. Overall shallow degree of aeration. No pleural effusion. No acute osseous abnormality. IMPRESSION: Bilateral hazy airspace opacities in the lower lungs likely consistent with multifocal pneumonia Electronically Signed   By: Prudencio Pair M.D.   On: 08/19/2020 02:59    Procedures Procedures (including critical care time)  Medications Ordered in ED Medications  AeroChamber Plus Flo-Vu Large MISC 1 each (has no administration in time range)  AeroChamber Plus Flo-Vu Large MISC (has no administration in time range)  albuterol (VENTOLIN HFA) 108 (90 Base) MCG/ACT inhaler 2 puff (2 puffs Inhalation Given 08/19/20 1550)    ED Course  I have reviewed the triage vital signs and the nursing notes.  Pertinent labs & imaging results that were available during my care of the patient were reviewed by me and considered in my medical decision making (see chart for details).    MDM Rules/Calculators/A&P                          Well appearing, covid PNA - may qualify for Covid mAB, Pt has no hypoxia - 95% on ambulation in person for me. Unvaccinated.  The patient does not fact have Covid pneumonia on the x-ray, she does qualify for monoclonal antibodies based on her BMI, I discussed this with the infusion clinic staff, they will contact the patient tomorrow to arrange for follow-up in infusion.  At this time the patient is not hypoxic febrile tachycardic or ill-appearing, she has been given albuterol inhaler, prescription for Tessalon, stable for discharge   Final Clinical Impression(s) / ED Diagnoses Final diagnoses:  Pneumonia due to COVID-19 virus    Rx / DC Orders ED Discharge Orders         Ordered     benzonatate (TESSALON) 100 MG capsule  Every 8 hours,   Status:  Discontinued        08/19/20 1647    benzonatate (TESSALON) 100 MG capsule  Every 8 hours        08/19/20 1648           Noemi Chapel, MD 08/19/20 1650

## 2020-08-20 ENCOUNTER — Ambulatory Visit (HOSPITAL_COMMUNITY): Payer: Self-pay

## 2020-08-21 ENCOUNTER — Telehealth: Payer: HRSA Program | Admitting: Physician Assistant

## 2020-08-21 DIAGNOSIS — R197 Diarrhea, unspecified: Secondary | ICD-10-CM

## 2020-08-21 DIAGNOSIS — U071 COVID-19: Secondary | ICD-10-CM

## 2020-08-21 MED FILL — BENZONATATE 100 MG CAPS: 100 | 7 days supply | Qty: 21 | Fill #0

## 2020-08-21 NOTE — Progress Notes (Signed)
Established Patient Office Visit  Subjective:  Patient ID: [name], female    DOB: [birth year]  Age: 39 y.o. MRN: [id number]  CC:  Chief Complaint  Patient presents with  . Follow-up    COVID +    Virtual Visit via Telephone Note  I connected with [name] on 08/22/20 at  2:20 PM EDT by telephone and verified that I am speaking with the correct person using two identifiers.  Location: Patient: Home Provider: [facility]    I discussed the limitations, risks, security and privacy concerns of performing an evaluation and management service by telephone and the availability of in person appointments. I also discussed with the patient that there may be a patient responsible charge related to this service. The patient expressed understanding and agreed to proceed.   History of Present Illness: States that she was seen in the ED on 08/19/20 after beginning to feel worse after begin diagnosed with covid 19 on 08/14/20.  States that she had an xray and was told that she had pneumonia.  States that she was unable to go to her infusion appt yesterday for monoclonal antibody treatment due to excessive diarrhea.    States that she has started to feel a little better, slept well last night.  States that she is using Nyquil with some relief.  States that she continues to have a dry cough.  States that she has diarrhea after most meals, but is using a "natural tea" with some relief.   Due to language barrier, an interpreter was present during the history-taking and subsequent discussion (and for part of the physical exam) with this patient.  Observations/Objective: Medical history and current medications reviewed, no physical exam completed    Past Medical History:  Diagnosis Date  . Carpal tunnel syndrome on both sides 01/2016  . Headache    "seasonal"  . Headache syndrome 05/26/2019  . Non-insulin dependent type 2 diabetes mellitus  ([place])    states only uses Insulin as needed  . Pneumonia due to COVID-19 virus   . Pyelonephritis 11/23/2019    Past Surgical History:  Procedure Laterality Date  . BILATERAL CARPAL TUNNEL RELEASE Bilateral 02/06/2016   Procedure: BILATERAL CARPAL TUNNEL RELEASE;  Surgeon: Leandrew Koyanagi, MD;  Location: [place];  Service: Orthopedics;  Laterality: Bilateral;  . NO PAST SURGERIES      Family History  Problem Relation Age of Onset  . Healthy Mother   . Healthy Father   . Diabetes Neg Hx   . Hypertension Neg Hx   . Hyperlipidemia Neg Hx     Social History   Socioeconomic History  . Marital status: Married    Spouse name: Not on file  . Number of children: Not on file  . Years of education: Not on file  . Highest education level: Not on file  Occupational History  . Not on file  Tobacco Use  . Smoking status: Never Smoker  . Smokeless tobacco: Never Used  Vaping Use  . Vaping Use: Never used  Substance and Sexual Activity  . Alcohol use: No  . Drug use: No  . Sexual activity: Not on file  Other Topics Concern  . Not on file  Social History Narrative  . Not on file   Social Determinants of Health   Financial Resource Strain:   . Difficulty of Paying Living Expenses: Not on file  Food Insecurity:   . Worried About  Running Out of Food in the Last Year: Not on file  . Ran Out of Food in the Last Year: Not on file  Transportation Needs:   . Lack of Transportation (Medical): Not on file  . Lack of Transportation (Non-Medical): Not on file  Physical Activity:   . Days of Exercise per Week: Not on file  . Minutes of Exercise per Session: Not on file  Stress:   . Feeling of Stress : Not on file  Social Connections:   . Frequency of Communication with Friends and Family: Not on file  . Frequency of Social Gatherings with Friends and Family: Not on file  . Attends Religious Services: Not on file  . Active Member of Clubs or Organizations: Not on file   . Attends Archivist Meetings: Not on file  . Marital Status: Not on file  Intimate Partner Violence:   . Fear of Current or Ex-Partner: Not on file  . Emotionally Abused: Not on file  . Physically Abused: Not on file  . Sexually Abused: Not on file    Outpatient Medications Prior to Visit  Medication Sig Dispense Refill  . benzonatate (TESSALON) 100 MG capsule Take 1 capsule (100 mg total) by mouth every 8 (eight) hours. 21 capsule 0  . Blood Glucose Monitoring Suppl (TRUE METRIX METER) w/Device KIT Use as instructed. Check blood glucose levels by fingerstick once per day. 1 kit 0  . glucose blood (TRUE METRIX BLOOD GLUCOSE TEST) test strip Use as instructed. Check blood glucose levels by fingerstick once per day. 100 each 12  . Levonorgestrel-Ethinyl Estradiol (AMETHIA) 0.15-0.03 &0.01 MG tablet Take 1 tablet by mouth daily. 28 tablet 0  . TRUEplus Lancets 28G MISC Use as instructed. Check blood glucose levels by fingerstick once per day. 100 each 3  . ibuprofen (ADVIL) 600 MG tablet Take 1 tablet (600 mg total) by mouth every 6 (six) hours as needed for headache. 30 tablet 0   No facility-administered medications prior to visit.    No Known Allergies  ROS Review of Systems  Constitutional: Positive for fatigue. Negative for chills and fever.  HENT: Negative for congestion, sinus pain and sore throat.   Eyes: Negative.   Respiratory: Positive for cough. Negative for shortness of breath and wheezing.   Cardiovascular: Negative.   Gastrointestinal: Positive for diarrhea. Negative for nausea and vomiting.  Endocrine: Negative.   Genitourinary: Negative.   Musculoskeletal: Positive for myalgias.  Skin: Negative.   Allergic/Immunologic: Negative.   Neurological: Positive for headaches.  Hematological: Negative.   Psychiatric/Behavioral: Negative.       Objective:     There were no vitals taken for this visit. Wt Readings from Last 3 Encounters:  11/29/18 141 lb  9.6 oz (64.2 kg)  11/19/18 142 lb (64.4 kg)  09/15/18 143 lb 12.8 oz (65.2 kg)     Health Maintenance Due  Topic Date Due  . PNEUMOCOCCAL POLYSACCHARIDE VACCINE AGE 23-64 HIGH RISK  Never done  . OPHTHALMOLOGY EXAM  Never done  . COVID-19 Vaccine (1) Never done  . TETANUS/TDAP  Never done  . FOOT EXAM  03/18/2018  . HEMOGLOBIN A1C  05/21/2019  . URINE MICROALBUMIN  08/24/2019  . INFLUENZA VACCINE  Never done    There are no preventive care reminders to display for this patient.  No results found for: TSH Lab Results  Component Value Date   WBC 7.7 11/25/2019   HGB 11.2 (L) 11/25/2019   HCT 35.7 (L) 11/25/2019  MCV 81.5 11/25/2019   PLT 342 11/25/2019   Lab Results  Component Value Date   NA 139 11/25/2019   K 3.4 (L) 11/25/2019   CO2 20 (L) 11/25/2019   GLUCOSE 98 11/25/2019   BUN 5 (L) 11/25/2019   CREATININE 0.81 11/25/2019   BILITOT 0.4 11/23/2019   ALKPHOS 59 11/23/2019   AST 15 11/23/2019   ALT 12 11/23/2019   PROT 7.9 11/23/2019   ALBUMIN 3.6 11/23/2019   CALCIUM 8.5 (L) 11/25/2019   ANIONGAP 11 11/25/2019   Lab Results  Component Value Date   CHOL 182 08/23/2018   Lab Results  Component Value Date   HDL 51 08/23/2018   Lab Results  Component Value Date   LDLCALC 105 (H) 08/23/2018   Lab Results  Component Value Date   TRIG 130 08/23/2018   Lab Results  Component Value Date   CHOLHDL 3.6 08/23/2018   Lab Results  Component Value Date   HGBA1C 5.8 (A) 11/19/2018      Assessment & Plan:   Problem List Items Addressed This Visit      Other   COVID-19 virus detected - Primary    Other Visit Diagnoses    Diarrhea, unspecified type         Assessment and Plan: 1. COVID-19 virus detected Patient is 10 days out from her first sign of symptoms, does not want to reschedule mono clonal antibody infusion.  Patient education given on monitoring oxygen levels, continue supportive care  2. Diarrhea, unspecified type Patient education  given on supportive care  Follow-up with virtual appointment in one week   Follow Up Instructions:    I discussed the assessment and treatment plan with the patient. The patient was provided an opportunity to ask questions and all were answered. The patient agreed with the plan and demonstrated an understanding of the instructions.   The patient was advised to call back or seek an in-person evaluation if the symptoms worsen or if the condition fails to improve as anticipated.  I provided 21 minutes of non-face-to-face time during this encounter.   Zuly Belkin S Mayers, PA-C    No orders of the defined types were placed in this encounter.   Follow-up: Return in about 1 week (around 08/28/2020).    Loraine Grip Mayers, PA-C

## 2020-08-21 NOTE — Progress Notes (Signed)
Patient verified DOB Patient has eaten today. Patient has taken medication today. Patient  Patient curenttly has a cough still present. Patient now has pneumonia as an after effect from [place].

## 2020-08-22 ENCOUNTER — Encounter: Payer: Self-pay | Admitting: Nurse Practitioner

## 2020-08-22 NOTE — Patient Instructions (Signed)
Diarrea en los adultos Diarrhea, Adult La diarrea consiste en deposiciones frecuentes, blandas o acuosas. La diarrea puede hacerlo sentir dbil y deshidratarlo. La deshidratacin puede causarle cansancio, sed, sequedad en la boca y disminucin en la frecuencia con la que orina. Generalmente, la diarrea dura entre 2 y 3 das. Sin embargo, puede durar ms tiempo si se trata de un signo de algo ms serio. Es importante tratar la diarrea como se lo haya indicado el mdico. Siga estas indicaciones en su casa: Comida y bebida     Siga estas recomendaciones como se lo haya indicado el mdico:  Tome una solucin de rehidratacin oral (oral rehydration solution, ORS). Es un medicamento de venta libre que ayuda a que el cuerpo recupere el equilibrio normal de nutrientes y agua. Se la encuentra en farmacias y tiendas minoristas.  Beba abundantes lquidos, como agua, trocitos de hielo, jugos de fruta rebajados con agua y bebidas deportivas bajas en caloras. Puede beber leche tambin, si lo desea.  Evite consumir lquidos que contengan mucha azcar o cafena, como bebidas energticas, bebidas deportivas y refrescos.  En la medida en que pueda, consuma alimentos blandos y fciles de digerir en pequeas cantidades. Estos alimentos incluyen bananas, compota de manzana, arroz, carnes magras, tostadas y galletas saladas.  Evite tomar alcohol.  Evite los alimentos condimentados o con alto contenido de grasa.  Medicamentos  Tome los medicamentos de venta libre y los recetados solamente como se lo haya indicado el mdico.  Si le recetaron un antibitico, tmelo como se lo haya indicado el mdico. No deje de usar el antibitico aunque comience a sentirse mejor. Indicaciones generales   Lvese las manos frecuentemente usando agua y jabn. Use desinfectante para manos si no dispone de agua y jabn. Las dems personas de la casa deben lavarse las manos tambin. Las manos deben lavarse: ? Despus de usar el  bao o cambiar un paal. ? Antes de preparar, cocinar o servir la comida. ? Mientras cuida de una persona enferma, o cuando visita a alguien en el hospital.  Beba suficiente lquido como para mantener la orina de color amarillo plido.  Descanse en su casa mientras se recupera.  Controle su afeccin para detectar cualquier cambio.  Tome un bao caliente para ayudar a disminuir el ardor o el dolor causados por los episodios frecuentes de diarrea.  Concurra a todas las visitas de control como se lo haya indicado el mdico. Esto es importante. Comunquese con un mdico si:  Tiene fiebre.  La diarrea empeora.  Aparecen nuevos sntomas.  No puede retener los lquidos.  Se siente aturdido o mareado.  Tiene dolor de cabeza.  Tiene calambres musculares. Solicite ayuda inmediatamente si:  Siente dolor en el pecho.  Se siente muy dbil o se desmaya.  Tiene heces con sangre, de color negro, o con aspecto alquitranado.  Tiene dolor intenso, clicos o distensin abdominal.  Tiene problemas para respirar o respira muy rpidamente.  Su corazn late muy rpidamente.  Siente la piel fra y hmeda.  Se siente confundido.  Tiene signos de deshidratacin, como los siguientes: ? Orina de color oscuro, muy escasa o falta de orina. ? Labios agrietados. ? Sequedad de boca. ? Ojos hundidos. ? Somnolencia. ? Debilidad. Resumen  La diarrea consiste en deposiciones frecuentes, blandas o acuosas. La diarrea puede hacerlo sentir dbil y deshidratarlo.  Beba suficiente lquido para mantener la orina de color amarillo plido.  Asegrese de lavarse las manos despus de usar el bao. Use desinfectante para manos si no   dispone de agua y jabn.  Comunquese con un mdico si la diarrea empeora o tiene nuevos sntomas.  Busque ayuda de inmediato si tiene signos de deshidratacin. Esta informacin no tiene como fin reemplazar el consejo del mdico. Asegrese de hacerle al mdico cualquier  pregunta que tenga. Document Revised: 06/07/2018 Document Reviewed: 06/07/2018 Elsevier Patient Education  2020 Elsevier Inc.  

## 2020-08-24 ENCOUNTER — Other Ambulatory Visit: Payer: Self-pay | Admitting: Sleep Medicine

## 2020-08-24 ENCOUNTER — Other Ambulatory Visit: Payer: Self-pay

## 2020-08-24 DIAGNOSIS — I471 Supraventricular tachycardia: Secondary | ICD-10-CM

## 2020-08-27 ENCOUNTER — Ambulatory Visit (HOSPITAL_COMMUNITY): Payer: Self-pay

## 2020-08-27 LAB — NOVEL CORONAVIRUS, NAA: SARS-CoV-2, NAA: NOT DETECTED

## 2020-08-28 ENCOUNTER — Telehealth: Payer: HRSA Program | Admitting: Physician Assistant

## 2020-08-28 DIAGNOSIS — U071 COVID-19: Secondary | ICD-10-CM

## 2020-08-28 MED ORDER — PSEUDOEPH-BROMPHEN-DM 30-2-10 MG/5ML PO SYRP: 5.0000 mL | ORAL_SOLUTION | Freq: Four times a day (QID) | ORAL | 0 refills | Status: AC | PRN

## 2020-08-28 MED ORDER — BENZONATATE 100 MG PO CAPS: 100.0000 mg | ORAL_CAPSULE | Freq: Three times a day (TID) | ORAL | 0 refills | Status: DC

## 2020-08-28 MED ORDER — ALBUTEROL SULFATE HFA 108 (90 BASE) MCG/ACT IN AERS: 2.0000 | INHALATION_SPRAY | Freq: Four times a day (QID) | RESPIRATORY_TRACT | 0 refills | Status: DC | PRN

## 2020-08-28 MED FILL — BROMPHENIR-PSEUDOEPHED-DM S: 30-2-10 | 6 days supply | Qty: 120 | Fill #0

## 2020-08-28 MED FILL — ALBUTEROL SULFATE HFA 108 (: 108 (90 BAS | 25 days supply | Qty: 9 | Fill #0

## 2020-08-28 NOTE — Progress Notes (Signed)
Established Patient Office Visit  Subjective:  Patient ID: [name], female    DOB: [birth year]  Age: 39 y.o. MRN: [id number]  CC:  Chief Complaint  Patient presents with  . Follow-up    COVID    Virtual Visit via Telephone Note  I connected with [name] on 08/28/20 at  2:20 PM EDT by telephone and verified that I am speaking with the correct person using two identifiers.  Location: Patient: Home Provider: [facility]    I discussed the limitations, risks, security and privacy concerns of performing an evaluation and management service by telephone and the availability of in person appointments. I also discussed with the patient that there may be a patient responsible charge related to this service. The patient expressed understanding and agreed to proceed.   History of Present Illness: Reports that she is feeling better, however she continues to have dry cough and some shortness of breath.  Reports that she ran out of the albuterol inhaler and the [employer]  Reports the inhaler was offering relief from her shortness of breath, the Tessalon Perles were offering moderate amount of relief.  Reports that she does have some difficulty sleeping due to the cough.  Reports she had a Covid test 4 days ago that was negative.  No other concerns at this time  Due to language barrier, an interpreter was present during the history-taking and subsequent discussion (and for part of the physical exam) with this patient.    Observations/Objective: Medical history and current medications reviewed, no physical exam completed     Past Medical History:  Diagnosis Date  . Carpal tunnel syndrome on both sides 01/2016  . Headache    "seasonal"  . Headache syndrome 05/26/2019  . Non-insulin dependent type 2 diabetes mellitus ([place])    states only uses Insulin as needed  . Pneumonia due to COVID-19 virus   . Pyelonephritis 11/23/2019     Past Surgical History:  Procedure Laterality Date  . BILATERAL CARPAL TUNNEL RELEASE Bilateral 02/06/2016   Procedure: BILATERAL CARPAL TUNNEL RELEASE;  Surgeon: Leandrew Koyanagi, MD;  Location: [place];  Service: Orthopedics;  Laterality: Bilateral;  . NO PAST SURGERIES      Family History  Problem Relation Age of Onset  . Healthy Mother   . Healthy Father   . Diabetes Neg Hx   . Hypertension Neg Hx   . Hyperlipidemia Neg Hx     Social History   Socioeconomic History  . Marital status: Married    Spouse name: Not on file  . Number of children: Not on file  . Years of education: Not on file  . Highest education level: Not on file  Occupational History  . Not on file  Tobacco Use  . Smoking status: Never Smoker  . Smokeless tobacco: Never Used  Vaping Use  . Vaping Use: Never used  Substance and Sexual Activity  . Alcohol use: No  . Drug use: No  . Sexual activity: Not on file  Other Topics Concern  . Not on file  Social History Narrative  . Not on file   Social Determinants of Health   Financial Resource Strain:   . Difficulty of Paying Living Expenses: Not on file  Food Insecurity:   . Worried About Charity fundraiser in the Last Year: Not on file  . Ran Out of Food in the Last Year: Not on file  Transportation  Needs:   . Lack of Transportation (Medical): Not on file  . Lack of Transportation (Non-Medical): Not on file  Physical Activity:   . Days of Exercise per Week: Not on file  . Minutes of Exercise per Session: Not on file  Stress:   . Feeling of Stress : Not on file  Social Connections:   . Frequency of Communication with Friends and Family: Not on file  . Frequency of Social Gatherings with Friends and Family: Not on file  . Attends Religious Services: Not on file  . Active Member of Clubs or Organizations: Not on file  . Attends Archivist Meetings: Not on file  . Marital Status: Not on file  Intimate Partner  Violence:   . Fear of Current or Ex-Partner: Not on file  . Emotionally Abused: Not on file  . Physically Abused: Not on file  . Sexually Abused: Not on file    Outpatient Medications Prior to Visit  Medication Sig Dispense Refill  . Levonorgestrel-Ethinyl Estradiol (AMETHIA) 0.15-0.03 &0.01 MG tablet Take 1 tablet by mouth daily. 28 tablet 0  . benzonatate (TESSALON) 100 MG capsule Take 1 capsule (100 mg total) by mouth every 8 (eight) hours. 21 capsule 0  . Blood Glucose Monitoring Suppl (TRUE METRIX METER) w/Device KIT Use as instructed. Check blood glucose levels by fingerstick once per day. 1 kit 0  . glucose blood (TRUE METRIX BLOOD GLUCOSE TEST) test strip Use as instructed. Check blood glucose levels by fingerstick once per day. 100 each 12  . TRUEplus Lancets 28G MISC Use as instructed. Check blood glucose levels by fingerstick once per day. 100 each 3   No facility-administered medications prior to visit.    No Known Allergies  ROS Review of Systems  Constitutional: Positive for fatigue. Negative for chills and fever.  HENT: Negative for congestion, ear pain, sinus pain and sore throat.   Eyes: Negative.   Respiratory: Positive for cough and shortness of breath. Negative for wheezing.   Cardiovascular: Negative for chest pain and palpitations.  Gastrointestinal: Negative for diarrhea, nausea and vomiting.  Endocrine: Negative.   Genitourinary: Negative.   Musculoskeletal: Negative for myalgias.  Skin: Negative.   Allergic/Immunologic: Negative.   Neurological: Negative.  Negative for headaches.  Hematological: Negative.   Psychiatric/Behavioral: Negative.       Objective:     There were no vitals taken for this visit. Wt Readings from Last 3 Encounters:  11/29/18 141 lb 9.6 oz (64.2 kg)  11/19/18 142 lb (64.4 kg)  09/15/18 143 lb 12.8 oz (65.2 kg)     Health Maintenance Due  Topic Date Due  . PNEUMOCOCCAL POLYSACCHARIDE VACCINE AGE 19-64 HIGH RISK  Never  done  . OPHTHALMOLOGY EXAM  Never done  . COVID-19 Vaccine (1) Never done  . TETANUS/TDAP  Never done  . FOOT EXAM  03/18/2018  . HEMOGLOBIN A1C  05/21/2019  . URINE MICROALBUMIN  08/24/2019  . INFLUENZA VACCINE  Never done    There are no preventive care reminders to display for this patient.  No results found for: TSH Lab Results  Component Value Date   WBC 7.7 11/25/2019   HGB 11.2 (L) 11/25/2019   HCT 35.7 (L) 11/25/2019   MCV 81.5 11/25/2019   PLT 342 11/25/2019   Lab Results  Component Value Date   NA 139 11/25/2019   K 3.4 (L) 11/25/2019   CO2 20 (L) 11/25/2019   GLUCOSE 98 11/25/2019   BUN 5 (L) 11/25/2019  CREATININE 0.81 11/25/2019   BILITOT 0.4 11/23/2019   ALKPHOS 59 11/23/2019   AST 15 11/23/2019   ALT 12 11/23/2019   PROT 7.9 11/23/2019   ALBUMIN 3.6 11/23/2019   CALCIUM 8.5 (L) 11/25/2019   ANIONGAP 11 11/25/2019   Lab Results  Component Value Date   CHOL 182 08/23/2018   Lab Results  Component Value Date   HDL 51 08/23/2018   Lab Results  Component Value Date   LDLCALC 105 (H) 08/23/2018   Lab Results  Component Value Date   TRIG 130 08/23/2018   Lab Results  Component Value Date   CHOLHDL 3.6 08/23/2018   Lab Results  Component Value Date   HGBA1C 5.8 (A) 11/19/2018      Assessment & Plan:   Problem List Items Addressed This Visit      Other   COVID-19 virus detected - Primary   Relevant Medications   brompheniramine-pseudoephedrine-DM 30-2-10 MG/5ML syrup   benzonatate (TESSALON) 100 MG capsule   albuterol (VENTOLIN HFA) 108 (90 Base) MCG/ACT inhaler     Assessment and Plan: 1. COVID-19 virus detected Patient education given on proper use of inhaler, encourage proper hydration, continued rest.  [facility], encouraged patient to return to mobile unit next week if cough continues.  Red flags given for prompt evaluation in the emergency  department.  - brompheniramine-pseudoephedrine-DM 30-2-10 MG/5ML syrup; Take 5 mLs by mouth 4 (four) times daily as needed for up to 10 days.  Dispense: 120 mL; Refill: 0 - benzonatate (TESSALON) 100 MG capsule; Take 1 capsule (100 mg total) by mouth every 8 (eight) hours.  Dispense: 21 capsule; Refill: 0 - albuterol (VENTOLIN HFA) 108 (90 Base) MCG/ACT inhaler; Inhale 2 puffs into the lungs every 6 (six) hours as needed for wheezing or shortness of breath.  Dispense: 8 g; Refill: 0   Follow Up Instructions:    I discussed the assessment and treatment plan with the patient. The patient was provided an opportunity to ask questions and all were answered. The patient agreed with the plan and demonstrated an understanding of the instructions.   The patient was advised to call back or seek an in-person evaluation if the symptoms worsen or if the condition fails to improve as anticipated.  I provided 12 minutes of non-face-to-face time during this encounter.   Cari S Mayers, PA-C   Meds ordered this encounter  Medications  . brompheniramine-pseudoephedrine-DM 30-2-10 MG/5ML syrup    Sig: Take 5 mLs by mouth 4 (four) times daily as needed for up to 10 days.    Dispense:  120 mL    Refill:  0    Order Specific Question:   Supervising Provider    Answer:   Asencion Noble E [[id number]]  . benzonatate (TESSALON) 100 MG capsule    Sig: Take 1 capsule (100 mg total) by mouth every 8 (eight) hours.    Dispense:  21 capsule    Refill:  0    Order Specific Question:   Supervising Provider    Answer:   Asencion Noble E [[id number]]  . albuterol (VENTOLIN HFA) 108 (90 Base) MCG/ACT inhaler    Sig: Inhale 2 puffs into the lungs every 6 (six) hours as needed for wheezing or shortness of breath.    Dispense:  8 g    Refill:  0    Order Specific Question:   Supervising Provider    Answer:  WRIGHT, PATRICK E [[id number]]    Follow-up: Return if symptoms worsen or fail to improve.    Loraine Grip Mayers, PA-C

## 2020-08-28 NOTE — Progress Notes (Signed)
Patient verified DOB Patient still has cough  Patient has eaten and drank today. Patient has not taken medication today. Patient has completed the inhaler and the tessalon pearls.  Patient denies N/V or diarrhea. Patient has been out of inhaler since Friday evening and reports the medication providing relief.

## 2020-08-28 NOTE — Patient Instructions (Signed)
Cmo usar Educational psychologist con medidor de dosis How to Use a Metered Dose Inhaler Un inhalador con medidor de dosis es un dispositivo porttil para tomar medicamentos que se deben Occupational psychologist los pulmones (inhalar). El dispositivo se puede usar para Architectural technologist una variedad de medicamentos inhalados, entre los que se incluyen:  Medicamentos de rescate o de alivio rpido, como broncodilatadores.  Medicamentos de control, como corticoesteroides. El medicamento se administra presionando el contenedor metlico para Financial controller una cantidad predeterminada de aerosol y [place]. Cada dispositivo contiene la cantidad de medicamento necesario para un nmero establecido de usos (inhalaciones). El mdico puede recomendarle que use un espaciador con el inhalador para ayudarlo a que reciba el medicamento ms eficazmente. Un espaciador es un tubo plstico con una boquilla en un extremo y Ardelia Mems abertura que lo conecta al inhalador en el otro extremo. Un espaciador retiene brevemente el medicamento en un conducto, lo que le permite inhalar ms medicamento. Cules son los riesgos? Si no [place], es posible que el medicamento no llegue a los pulmones para ayudarle a Ambulance person. Los medicamentos inhalados pueden tener efectos secundarios, por ejemplo:  Infeccin en la boca o la garganta.  Tos.  Ronquera.  Dolor de [place].  Nuseas y vmitos.  Infeccin pulmonar (neumona) en personas que tienen una afeccin pulmonar llamada EPOC. Cmo usar un inhalador con medidor de dosis sin un espaciador  1. Retire la tapa del inhalador. 2. Si es la primera vez que [place] el [place], agtelo durante 5 segundos y dispare 4 descargas al aire lejos del rostro. Esto se llama imprimacin. 3. Sacuda el inhalador durante 5segundos. 4. Coloque el inhalador de [employer] parte superior del envase quede [place]. 5. Coloque el dedo ndice en la parte superior del envase del medicamento. Sostenga la parte  inferior del inhalador con su pulgar. 6. Exhale normalmente todo el aire que pueda, alejado del [place]. 7. Coloque el inhalador [employer] dientes y apriete los labios fuertemente alrededor de la boquilla, o sostenga el inhalador a una distancia de 1 a 2pulgadas (2,5 a 5cm) de la boca abierta. Mantenga la lengua baja despejando el paso. Si no est seguro de Copywriter, advertising, consulte a su mdico. 8. Presione el contenedor hacia abajo con el dedo ndice para Product/process development scientist, luego inhale lenta y profundamente por la boca (no por la Lawyer) hasta que los pulmones estn completamente llenos. La inhalacin debe llevarle de 4 a 6segundos. 9. Mantenga el medicamento en los pulmones entre 5 y 10segundos (10segundos es lo ms indicado). Esto ayuda a que el medicamento ingrese en las vas respiratorias pequeas y en los pulmones. 10. Con los labios en forma de crculo apretado (fruncidos) exhale lentamente. 11. Repita los pasos 3 al 10 hasta que se haya administrado la cantidad de descargas que el mdico le indic. Espere al menos 3mnuto entre una descarga y otra o segn las indicaciones. 1Brown Deeren el inhalador. 124 Si utiliza un inhalador con corticoesteroides, enjuague su boca con agua, haga grgaras y escupa el agua. Notrague el agua. Cmo usar uEducational psychologistde dosis medida con espaciador  1. Retire la tapa del inhalador. 2. Si es la primera vez que uCanadael i[place] agtelo durante 5 segundos y dispare 4 descargas al aire lejos del rostro. Esto se llama imprimacin. 3. Sacuda el inhalador durante 5segundos. 4. Coloque el extremo abierto del eGeographical information systems officeren la boquilla del inhalador. 5. Coloque el inhalador de modo que la parte superior del envase  quede hacia arriba y la boquilla del espaciador delante suyo. 6. Coloque el dedo ndice en la parte superior del envase del medicamento. Sostenga la parte inferior del inhalador y Arts development officer con su pulgar. 7. Exhale normalmente todo  el aire que pueda, lejos del espaciador. 8. Coloque el espaciador [employer] dientes y apriete los labios firmemente a su alrededor. Mantenga la lengua baja despejando el paso. 9. Presione el contenedor hacia abajo con el dedo ndice para Product/process development scientist, luego inhale lenta y profundamente por la boca (no por la Lawyer) hasta que los pulmones estn completamente llenos. La inhalacin debe llevarle de 4 a 6segundos. 10. Mantenga el medicamento en los pulmones entre 5 y 10segundos (10segundos es lo ms indicado). Esto ayuda a que el medicamento ingrese en las vas respiratorias pequeas y en los pulmones. 11. Con los labios en forma de crculo apretado (fruncidos) exhale lentamente. 12. Repita los pasos 3 al 11 hasta que se haya administrado la cantidad de descargas que el mdico le indic. Espere al menos 66mnuto entre una descarga y otra o segn las indicaciones. 141 Retire el espaciador del inhalador y colquele la tapa al iTax inspector 170 Si utiliza un inhalador con corticoesteroides, enjuague su boca con agua, haga grgaras y escupa el agua. Notrague el agua. Siga estas indicaciones en su casa:  Tome el medicamento para iAdvice workersolo como se lo haya indicado el mdico. No use el inhalador ms de lo que su mdico le indique.  Concurra a todas las visitas de seguimiento como se lo haya indicado el mdico. Esto es importante.  Si el inhalador tiene uAdministrator, arts puede controlarlo para saber la cantidad de mAT&T Si el inhalador no tiene cScientist, physiological pdale a su mdico que lo ayude a dSoil scientista llenarlo y anote la fecha de reposicin en un calendario o en el envase del inhalador. Tenga en cuenta que no puede saber cundo un inhalador est vaco al sacudirlo.  Siga las indicaciones del prospecto que trae el envase para el cuidado y la limpieza del iTax inspectory del eGeographical information systems officer [place] con un mdico si:  Los sntomas solo se aChief Financial Officercon eForensic psychologist  Tiene dificultad para uWater quality scientist  Experimenta un aumento de la flema.  Tiene dolores de c[place] Solicite ayuda de inmediato si:  SFrontierde alivio despus de uWater quality scientist  Tiene mareos.  Tiene la frecuencia cardaca acelerada.  Tiene escalofros o fiebre.  Tiene transpiracin nocturna.  Hay sangre en la flema. Resumen  Un inhalador con medidor de dosis es un dispositivo porttil para tomar medicamentos que se deben aOccupational psychologistlos pulmones (inhalar).  El medicamento se administra presionando el contenedor metlico para lFinancial controlleruna cantidad predeterminada de aerosol y [place]  Cada dispositivo contiene la cantidad de medicamento necesario para un nmero establecido de usos (inhalaciones). Esta informacin no tiene cMarine scientistel consejo del mdico. Asegrese de hacerle al mdico cualquier pregunta que tenga. Document Revised: 07/28/2017 Document Reviewed: 06/22/2017 Elsevier Patient Education  2[place]

## 2020-08-29 ENCOUNTER — Ambulatory Visit: Payer: Self-pay

## 2020-10-02 ENCOUNTER — Other Ambulatory Visit: Payer: Self-pay | Admitting: Nurse Practitioner

## 2020-10-02 ENCOUNTER — Other Ambulatory Visit: Payer: Self-pay

## 2020-10-02 ENCOUNTER — Encounter: Payer: Self-pay | Admitting: Nurse Practitioner

## 2020-10-02 ENCOUNTER — Ambulatory Visit: Payer: Self-pay | Attending: Nurse Practitioner | Admitting: Nurse Practitioner

## 2020-10-02 VITALS — BP 111/72 | HR 77 | Temp 97.7°F | Ht 61.0 in | Wt 144.2 lb

## 2020-10-02 DIAGNOSIS — Z30011 Encounter for initial prescription of contraceptive pills: Secondary | ICD-10-CM

## 2020-10-02 DIAGNOSIS — D649 Anemia, unspecified: Secondary | ICD-10-CM

## 2020-10-02 DIAGNOSIS — E119 Type 2 diabetes mellitus without complications: Secondary | ICD-10-CM

## 2020-10-02 DIAGNOSIS — B351 Tinea unguium: Secondary | ICD-10-CM

## 2020-10-02 DIAGNOSIS — Z0000 Encounter for general adult medical examination without abnormal findings: Secondary | ICD-10-CM

## 2020-10-02 LAB — POCT GLYCOSYLATED HEMOGLOBIN (HGB A1C): Hemoglobin A1C: 6 % — AB (ref 4.0–5.6)

## 2020-10-02 LAB — GLUCOSE, POCT (MANUAL RESULT ENTRY): POC Glucose: 142 mg/dl — AB (ref 70–99)

## 2020-10-02 MED ORDER — LEVONORGEST-ETH ESTRAD 91-DAY 0.15-0.03 &0.01 MG PO TABS: 1.0000 | ORAL_TABLET | Freq: Every day | ORAL | 3 refills | Status: DC

## 2020-10-02 MED ORDER — TERBINAFINE HCL 250 MG PO TABS: 250.0000 mg | ORAL_TABLET | Freq: Every day | ORAL | 2 refills | Status: DC

## 2020-10-02 MED FILL — TERBINAFINE HCL 250 MG TABS: 250 | 30 days supply | Qty: 30 | Fill #0

## 2020-10-02 NOTE — Addendum Note (Signed)
Addended by: Geryl Rankins on: 10/02/2020 11:41 AM   Modules accepted: Orders

## 2020-10-02 NOTE — Progress Notes (Addendum)
Assessment & Plan:  [name] was seen today for annual exam.  Diagnoses and all orders for this visit:  Encounter for annual physical exam  Type 2 diabetes mellitus without complication, unspecified whether long term insulin use (HCC) -     Glucose (CBG) -     HgB A1c -     Microalbumin/Creatinine Ratio, Urine -     Lipid panel -     CMP14+EGFR  Anemia, unspecified type -     CBC  Oral contraception initial prescription -     Levonorgestrel-Ethinyl Estradiol (AMETHIA) 0.15-0.03 &0.01 MG tablet; Take 1 tablet by mouth daily. Please fill as 90 day supply  Onychomycosis -     terbinafine (LAMISIL) 250 MG tablet; Take 1 tablet (250 mg total) by mouth daily. -     Cancel: Basic metabolic panel; Future -     CMP14+EGFR; Future    Patient has been counseled on age-appropriate routine health concerns for screening and prevention. These are reviewed and up-to-date. Referrals have been placed accordingly. Immunizations are up-to-date or declined.    Subjective:   Chief Complaint  Patient presents with  . Annual Exam    Pt. is here for a physical exam.    HPI [name] 39 y.o. female presents to office today for annual physical.   DM TYPE 2 Well controlled with diet only at this time.   She is requesting a vitamin as she will be trying to conceive next year. I recommended OTC MVI or prenatal vitamins.    Review of Systems  Constitutional: Negative.  Negative for chills, fever, malaise/fatigue and weight loss.  Respiratory: Positive for shortness of breath (minimal and intermittent). Negative for cough, sputum production and wheezing.   Cardiovascular: Negative.  Negative for chest pain and leg swelling.  Gastrointestinal: Negative.  Negative for abdominal pain, blood in stool, constipation, diarrhea, heartburn, melena, nausea and vomiting.  Genitourinary: Negative.   Skin: Negative.  Negative for rash.  Neurological: Negative.  Negative for dizziness, tremors,  speech change, focal weakness, seizures and headaches.  Psychiatric/Behavioral: Negative.  Negative for depression and suicidal ideas. The patient is not nervous/anxious and does not have insomnia.     Past Medical History:  Diagnosis Date  . Carpal tunnel syndrome on both sides 01/2016  . Headache    "seasonal"  . Headache syndrome 05/26/2019  . Non-insulin dependent type 2 diabetes mellitus ([place])    states only uses Insulin as needed  . Pneumonia due to COVID-19 virus   . Pyelonephritis 11/23/2019    Past Surgical History:  Procedure Laterality Date  . BILATERAL CARPAL TUNNEL RELEASE Bilateral 02/06/2016   Procedure: BILATERAL CARPAL TUNNEL RELEASE;  Surgeon: Leandrew Koyanagi, MD;  Location: [facility];  Service: Orthopedics;  Laterality: Bilateral;  . NO PAST SURGERIES      Family History  Problem Relation Age of Onset  . Healthy Mother   . Healthy Father   . Diabetes Neg Hx   . Hypertension Neg Hx   . Hyperlipidemia Neg Hx     Social History Reviewed with no changes to be made today.   Outpatient Medications Prior to Visit  Medication Sig Dispense Refill  . albuterol (VENTOLIN HFA) 108 (90 Base) MCG/ACT inhaler Inhale 2 puffs into the lungs every 6 (six) hours as needed for wheezing or shortness of breath. 8 g 0  . benzonatate (TESSALON) 100 MG capsule Take 1 capsule (100 mg total) by mouth every 8 (eight) hours.  21 capsule 0  . Levonorgestrel-Ethinyl Estradiol (AMETHIA) 0.15-0.03 &0.01 MG tablet Take 1 tablet by mouth daily. 28 tablet 0   No facility-administered medications prior to visit.    No Known Allergies     Objective:    BP 111/72 (BP Location: Left Arm, Patient Position: Sitting, Cuff Size: Normal)   Pulse 77   Temp 97.7 F (36.5 C) (Temporal)   Ht _0  (1.549 m)   Wt 144 lb 3.2 oz (65.4 kg)   SpO2 99%   BMI 27.25 kg/m  Wt Readings from Last 3 Encounters:  10/02/20 144 lb 3.2 oz (65.4 kg)  11/29/18 141 lb 9.6 oz (64.2 kg)  11/19/18  142 lb (64.4 kg)    Physical Exam Constitutional:      Appearance: She is well-developed.  HENT:     Head: Normocephalic and atraumatic.     Right Ear: External ear normal.     Left Ear: External ear normal.     Nose: Nose normal.     Mouth/Throat:     Pharynx: No oropharyngeal exudate.  Eyes:     General: No scleral icterus.       Right eye: No discharge.     Conjunctiva/sclera: Conjunctivae normal.     Pupils: Pupils are equal, round, and reactive to light.  Neck:     Thyroid: No thyromegaly.     Trachea: No tracheal deviation.  Cardiovascular:     Rate and Rhythm: Normal rate and regular rhythm.     Heart sounds: Normal heart sounds. No murmur heard.  No friction rub.  Pulmonary:     Effort: Pulmonary effort is normal. No accessory muscle usage or respiratory distress.     Breath sounds: Normal breath sounds. No decreased breath sounds, wheezing, rhonchi or rales.  Chest:     Breasts:        Right: No skin change or tenderness.        Left: No inverted nipple, skin change or tenderness.  Abdominal:     General: Bowel sounds are normal. There is no distension.     Palpations: Abdomen is soft. There is no mass.     Tenderness: There is no abdominal tenderness. There is no guarding or rebound.  Musculoskeletal:        General: No tenderness or deformity. Normal range of motion.     Cervical back: Normal range of motion and neck supple.  Feet:     Right foot:     Toenail Condition: Right toenails are abnormally thick. Fungal disease present.    Left foot:     Toenail Condition: Left toenails are abnormally thick. Fungal disease present. Lymphadenopathy:     Cervical: No cervical adenopathy.  Skin:    General: Skin is warm and dry.     Findings: No erythema.  Neurological:     Mental Status: She is alert and oriented to person, place, and time.     Cranial Nerves: No cranial nerve deficit.     Coordination: Coordination normal.     Deep Tendon Reflexes: Reflexes are  normal and symmetric.  Psychiatric:        Speech: Speech normal.        Behavior: Behavior normal.        Thought Content: Thought content normal.        Judgment: Judgment normal.          Patient has been counseled extensively about nutrition and exercise as well as the importance of  adherence with medications and regular follow-up. The patient was given clear instructions to go to ER or return to medical center if symptoms don't improve, worsen or new problems develop. The patient verbalized understanding.   Follow-up: Return if symptoms worsen or fail to improve, for BMP 6 weeks. See me as needed.   [name], FNP-BC [facility] [place], [place]   10/02/2020, 11:41 AM

## 2020-10-03 ENCOUNTER — Other Ambulatory Visit: Payer: Self-pay | Admitting: Pharmacist

## 2020-10-03 DIAGNOSIS — Z30011 Encounter for initial prescription of contraceptive pills: Secondary | ICD-10-CM

## 2020-10-03 LAB — CBC
Hematocrit: 40.1 % (ref 34.0–46.6)
Hemoglobin: 13.1 g/dL (ref 11.1–15.9)
MCH: 29 pg (ref 26.6–33.0)
MCHC: 32.7 g/dL (ref 31.5–35.7)
MCV: 89 fL (ref 79–97)
Platelets: 328 10*3/uL (ref 150–450)
RBC: 4.52 x10E6/uL (ref 3.77–5.28)
RDW: 16.3 % — ABNORMAL HIGH (ref 11.7–15.4)
WBC: 8.9 10*3/uL (ref 3.4–10.8)

## 2020-10-03 LAB — CMP14+EGFR
ALT: 18 IU/L (ref 0–32)
AST: 22 IU/L (ref 0–40)
Albumin/Globulin Ratio: 1.3 (ref 1.2–2.2)
Albumin: 4.5 g/dL (ref 3.8–4.8)
Alkaline Phosphatase: 86 IU/L (ref 44–121)
BUN/Creatinine Ratio: 15 (ref 9–23)
BUN: 12 mg/dL (ref 6–20)
Bilirubin Total: 0.2 mg/dL (ref 0.0–1.2)
CO2: 23 mmol/L (ref 20–29)
Calcium: 9.4 mg/dL (ref 8.7–10.2)
Chloride: 101 mmol/L (ref 96–106)
Creatinine, Ser: 0.81 mg/dL (ref 0.57–1.00)
GFR calc Af Amer: 107 mL/min/{1.73_m2} (ref >59)
GFR calc non Af Amer: 92 mL/min/{1.73_m2} (ref >59)
Globulin, Total: 3.6 g/dL (ref 1.5–4.5)
Glucose: 122 mg/dL — ABNORMAL HIGH (ref 65–99)
Potassium: 4.9 mmol/L (ref 3.5–5.2)
Sodium: 141 mmol/L (ref 134–144)
Total Protein: 8.1 g/dL (ref 6.0–8.5)

## 2020-10-03 LAB — LIPID PANEL
Chol/HDL Ratio: 3.6 ratio (ref 0.0–4.4)
Cholesterol, Total: 200 mg/dL — ABNORMAL HIGH (ref 100–199)
HDL: 56 mg/dL (ref >39)
LDL Chol Calc (NIH): 123 mg/dL — ABNORMAL HIGH (ref 0–99)
Triglycerides: 116 mg/dL (ref 0–149)
VLDL Cholesterol Cal: 21 mg/dL (ref 5–40)

## 2020-10-03 LAB — MICROALBUMIN / CREATININE URINE RATIO
Creatinine, Urine: 80.2 mg/dL
Microalb/Creat Ratio: 92 mg/g creat — ABNORMAL HIGH (ref 0–29)
Microalbumin, Urine: 73.8 ug/mL

## 2020-10-03 MED ORDER — LEVONORGEST-ETH ESTRAD 91-DAY 0.15-0.03 &0.01 MG PO TABS: 1.0000 | ORAL_TABLET | Freq: Every day | ORAL | 1 refills | Status: DC

## 2020-10-03 MED FILL — LEVONO-E ESTRAD 0.15-0.03-0: 0.15-0.03 & | 91 days supply | Qty: 91 | Fill #0

## 2020-10-07 ENCOUNTER — Other Ambulatory Visit: Payer: Self-pay | Admitting: Nurse Practitioner

## 2020-10-07 MED ORDER — LISINOPRIL 2.5 MG PO TABS: 2.5000 mg | ORAL_TABLET | Freq: Every day | ORAL | 1 refills | Status: DC

## 2020-10-08 MED FILL — LISINOPRIL 2.5 MG TABLET: 2.5 | 30 days supply | Qty: 30 | Fill #0

## 2021-01-07 ENCOUNTER — Other Ambulatory Visit: Payer: Self-pay | Admitting: Physician Assistant

## 2021-01-07 ENCOUNTER — Other Ambulatory Visit: Payer: Self-pay | Admitting: Nurse Practitioner

## 2021-01-07 DIAGNOSIS — U071 COVID-19: Secondary | ICD-10-CM

## 2021-01-07 MED ORDER — LISINOPRIL 2.5 MG PO TABS: 2.5000 mg | ORAL_TABLET | Freq: Every day | ORAL | 1 refills | Status: DC

## 2021-01-08 MED FILL — LEVONO-E ESTRAD 0.15-0.03-0: 0.15-0.03 & | 91 days supply | Qty: 91 | Fill #1

## 2021-01-08 MED FILL — LISINOPRIL 2.5 MG TABLET: 2.5 | 30 days supply | Qty: 30 | Fill #0

## 2021-01-08 MED FILL — LISINOPRIL 2.5 MG TABLET: 2.5 | 30 days supply | Qty: 30 | Fill #1

## 2021-01-08 NOTE — Telephone Encounter (Signed)
Requested medication (s) are due for refill today: yes  Requested medication (s) are on the active medication list:yes  Last refill: 08/28/20  #21  0 refills  Future visit scheduled  no  Notes to clinic: Historical provider  Requested Prescriptions  Pending Prescriptions Disp Refills   benzonatate (TESSALON) 100 MG capsule 21 capsule 0    Sig: Take 1 capsule (100 mg total) by mouth every 8 (eight) hours.      There is no refill protocol information for this order

## 2021-01-09 ENCOUNTER — Other Ambulatory Visit: Payer: Self-pay | Admitting: Family Medicine

## 2021-01-09 MED ORDER — BENZONATATE 100 MG PO CAPS: 100.0000 mg | ORAL_CAPSULE | Freq: Three times a day (TID) | ORAL | 0 refills | Status: DC

## 2021-01-09 MED FILL — BENZONATATE 100 MG CAPS: 100 | 7 days supply | Qty: 21 | Fill #0

## 2021-03-16 ENCOUNTER — Other Ambulatory Visit: Payer: Self-pay

## 2021-08-08 ENCOUNTER — Ambulatory Visit: Payer: Self-pay

## 2021-08-08 ENCOUNTER — Other Ambulatory Visit: Payer: Self-pay

## 2021-08-08 ENCOUNTER — Ambulatory Visit
Admission: RE | Admit: 2021-08-08 | Discharge: 2021-08-08 | Disposition: A | Discharge status: Home / Self Care | Payer: Self-pay | Source: Ambulatory Visit | Attending: Family Medicine | Admitting: Family Medicine

## 2021-08-08 VITALS — BP 93/60 | HR 77 | Temp 97.9°F | Resp 18

## 2021-08-08 DIAGNOSIS — N390 Urinary tract infection, site not specified: Secondary | ICD-10-CM | POA: Insufficient documentation

## 2021-08-08 LAB — POCT URINALYSIS DIP (MANUAL ENTRY)
Bilirubin, UA: NEGATIVE
Glucose, UA: NEGATIVE mg/dL
Ketones, POC UA: NEGATIVE mg/dL
Nitrite, UA: NEGATIVE
Protein Ur, POC: NEGATIVE mg/dL
Spec Grav, UA: 1.01 (ref 1.010–1.025)
Urobilinogen, UA: 0.2 E.U./dL
pH, UA: 6 (ref 5.0–8.0)

## 2021-08-08 LAB — POCT URINE PREGNANCY: Preg Test, Ur: NEGATIVE

## 2021-08-08 MED ORDER — NITROFURANTOIN MONOHYD MACRO 100 MG PO CAPS
100.0000 mg | ORAL_CAPSULE | Freq: Two times a day (BID) | ORAL | 0 refills | Status: DC
  Filled 2021-08-08: qty 10, 5d supply, fill #0

## 2021-08-08 NOTE — ED Provider Notes (Signed)
[name]    CSN: [id number] Arrival date & time: 08/08/21  1338      History   Chief Complaint Chief Complaint  Patient presents with   Flank Pain    right   Dysuria    HPI [name] is a 40 y.o. female.   Medical interpreter utilized today to facilitate visit with patient's consent.  Patient presenting today with 2-day history of right-sided flank pain, urinary frequency, dysuria, headache.  Denies fever, chills, nausea, vomiting, hematuria, vaginal symptoms.  Taking Tylenol with minimal relief of symptoms.  History of UTIs and pyelonephritis.   Past Medical History:  Diagnosis Date   Carpal tunnel syndrome on both sides 01/2016   Headache    "seasonal"   Headache syndrome 05/26/2019   Non-insulin dependent type 2 diabetes mellitus ([place])    states only uses Insulin as needed   Pneumonia due to COVID-19 virus    Pyelonephritis 11/23/2019    Patient Active Problem List   Diagnosis Date Noted   COVID-19 virus detected 08/14/2020   Pyelonephritis 11/24/2019   Lymphadenopathy    Headache syndrome 05/26/2019   DM type 2 (diabetes mellitus, type 2) ([place]) 08/02/2015   Hydronephrosis 08/10/2014   Hypokalemia 08/10/2014   Nausea and vomiting 08/10/2014   Pyelonephritis, acute 08/10/2014   Hyperglycemia 08/10/2014   DUB (dysfunctional uterine bleeding) 07/14/2014   Uterine fibroid 07/14/2014    Past Surgical History:  Procedure Laterality Date   BILATERAL CARPAL TUNNEL RELEASE Bilateral 02/06/2016   Procedure: BILATERAL CARPAL TUNNEL RELEASE;  Surgeon: Leandrew Koyanagi, MD;  Location: [facility];  Service: Orthopedics;  Laterality: Bilateral;   NO PAST SURGERIES      OB History     Gravida  5   Para  4   Term  2   Preterm  2   AB  0   Living  3      SAB      IAB      Ectopic      Multiple      Live Births  2            Home Medications    Prior to Admission medications   Medication Sig Start  Date End Date Taking? Authorizing Provider  nitrofurantoin, macrocrystal-monohydrate, (MACROBID) 100 MG capsule Take 1 capsule (100 mg total) by mouth 2 (two) times daily. 08/08/21  Yes Volney American, PA-C  albuterol (VENTOLIN HFA) 108 (90 Base) MCG/ACT inhaler Inhale 2 puffs into the lungs every 6 (six) hours as needed for wheezing or shortness of breath. 08/28/20   Mayers, Cari S, PA-C  benzonatate (TESSALON) 100 MG capsule TAKE 1 CAPSULE (100 MG TOTAL) BY MOUTH EVERY 8 (EIGHT) HOURS. 01/09/21 01/09/22  Charlott Rakes, MD  Levonorgestrel-Ethinyl Estradiol (AMETHIA) 0.15-0.03 &0.01 MG tablet TAKE 1 TABLET BY MOUTH DAILY. PLEASE FILL AS 90 DAY SUPPLY 10/03/20 10/03/21  Charlott Rakes, MD  lisinopril (ZESTRIL) 2.5 MG tablet TAKE 1 TABLET (2.5 MG TOTAL) BY MOUTH DAILY. 01/07/21 01/07/22  [name], NP  terbinafine (LAMISIL) 250 MG tablet TAKE 1 TABLET (250 MG TOTAL) BY MOUTH DAILY. 10/02/20 10/02/21  [name], NP    Family History Family History  Problem Relation Age of Onset   Healthy Mother    Healthy Father    Diabetes Neg Hx    Hypertension Neg Hx    Hyperlipidemia Neg Hx     Social History Social History   Tobacco Use  Smoking status: Never   Smokeless tobacco: Never  Vaping Use   Vaping Use: Never used  Substance Use Topics   Alcohol use: No   Drug use: No     Allergies   Patient has no known allergies.   Review of Systems Review of Systems PER HPI   Physical Exam Triage Vital Signs ED Triage Vitals  Enc Vitals Group     BP 08/08/21 1349 93/60     Pulse Rate 08/08/21 1349 77     Resp 08/08/21 1349 18     Temp 08/08/21 1349 97.9 F (36.6 C)     Temp Source 08/08/21 1349 Oral     SpO2 08/08/21 1349 97 %     Weight --      Height --      Head Circumference --      Peak Flow --      Pain Score 08/08/21 1353 8     Pain Loc --      Pain Edu? --      Excl. in [place]? --    No data found.  Updated Vital Signs BP 93/60 (BP Location: Left  Arm)   Pulse 77   Temp 97.9 F (36.6 C) (Oral)   Resp 18   SpO2 97%   Visual Acuity Right Eye Distance:   Left Eye Distance:   Bilateral Distance:    Right Eye Near:   Left Eye Near:    Bilateral Near:     Physical Exam Vitals and nursing note reviewed.  Constitutional:      Appearance: Normal appearance. She is not ill-appearing.  HENT:     Head: Atraumatic.     Mouth/Throat:     Mouth: Mucous membranes are moist.  Eyes:     Extraocular Movements: Extraocular movements intact.     Conjunctiva/sclera: Conjunctivae normal.  Cardiovascular:     Rate and Rhythm: Normal rate and regular rhythm.     Heart sounds: Normal heart sounds.  Pulmonary:     Effort: Pulmonary effort is normal.     Breath sounds: Normal breath sounds.  Abdominal:     General: Bowel sounds are normal. There is no distension.     Palpations: Abdomen is soft.     Tenderness: There is no abdominal tenderness. There is right CVA tenderness. There is no left CVA tenderness or guarding.  Musculoskeletal:        General: Normal range of motion.     Cervical back: Normal range of motion and neck supple.  Skin:    General: Skin is warm and dry.     Findings: No erythema or rash.  Neurological:     Mental Status: She is alert and oriented to person, place, and time.     Motor: No weakness.     Gait: Gait normal.  Psychiatric:        Mood and Affect: Mood normal.        Thought Content: Thought content normal.        Judgment: Judgment normal.   UC Treatments / Results  Labs (all labs ordered are listed, but only abnormal results are displayed) Labs Reviewed  POCT URINALYSIS DIP (MANUAL ENTRY) - Abnormal; Notable for the following components:      Result Value   Clarity, UA hazy (*)    Blood, UA trace-intact (*)    Leukocytes, UA Large (3+) (*)    All other components within normal limits  URINE CULTURE  POCT URINE PREGNANCY  EKG  Radiology No results found.  Procedures Procedures  (including critical care time)  Medications Ordered in UC Medications - No data to display  Initial Impression / Assessment and Plan / UC Course  I have reviewed the triage vital signs and the nursing notes.  Pertinent labs & imaging results that were available during my care of the patient were reviewed by me and considered in my medical decision making (see chart for details).     Vital signs benign, exam reassuring. U/A with evidence of UTI, urine culture pending. Macrobid sent, adjust if needed based on results. Return for worsening sxs.   Final Clinical Impressions(s) / UC Diagnoses   Final diagnoses:  Acute lower UTI   Discharge Instructions   None    ED Prescriptions     Medication Sig Dispense Auth. Provider   nitrofurantoin, macrocrystal-monohydrate, (MACROBID) 100 MG capsule Take 1 capsule (100 mg total) by mouth 2 (two) times daily. 10 capsule Volney American, [place]      PDMP not reviewed this encounter.   Volney American, [place] 08/08/21 1535

## 2021-08-08 NOTE — ED Triage Notes (Signed)
Two day h/o right sided flank pain. Confirms urinary frequency and dysuria. Denies urinary urgency and hematuria. Has been taking tylenol with temporary relief.

## 2021-08-09 LAB — URINE CULTURE: Culture: NO GROWTH

## 2021-09-23 ENCOUNTER — Other Ambulatory Visit: Payer: Self-pay

## 2021-09-23 MED FILL — Terbinafine HCl Tab 250 MG: ORAL | 30 days supply | Qty: 30 | Fill #0 | Status: CN

## 2021-09-30 ENCOUNTER — Other Ambulatory Visit: Payer: Self-pay

## 2021-09-30 MED FILL — Terbinafine HCl Tab 250 MG: ORAL | 30 days supply | Qty: 30 | Fill #0 | Status: AC

## 2021-10-16 IMAGING — CT CT HEAD W/O CM
4 series · 16 of 47 positions shown, 18 images · non-contrast
Comparison: No pertinent prior studies available for comparison.

CLINICAL DATA: Headache, intracranial hemorrhage suspected.
Additional history provided: Left-sided headache.

EXAM:
CT HEAD WITHOUT CONTRAST
TECHNIQUE: Contiguous axial images were obtained from the base of the skull
through the vertex without intravenous contrast.

[Series 3: head wo · axial · 0.39mm/px · z∈[-114,+6]mm · 7 of 34 slices shown, 9 images]
[im 5/34  brain]
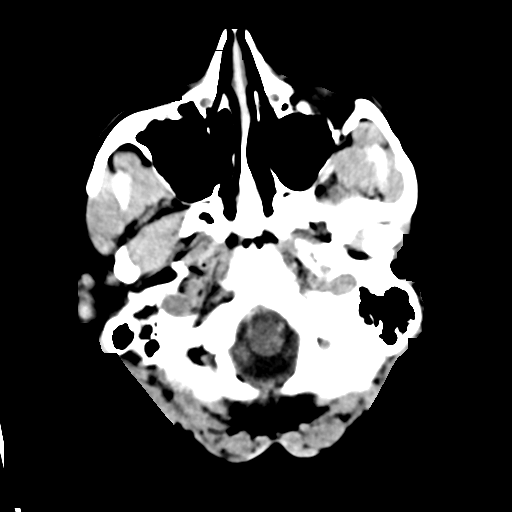
[im 5/34  bone]
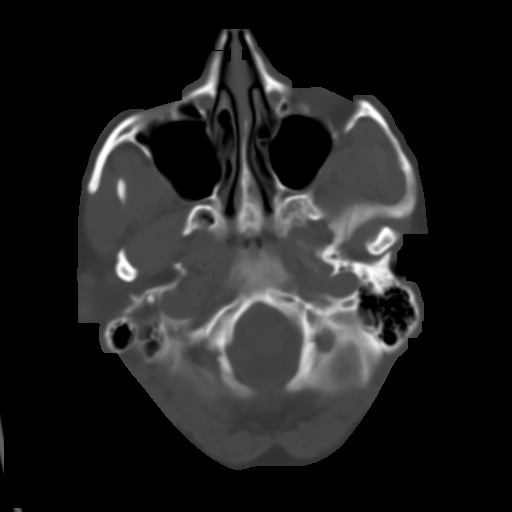
[im 9/34  brain]
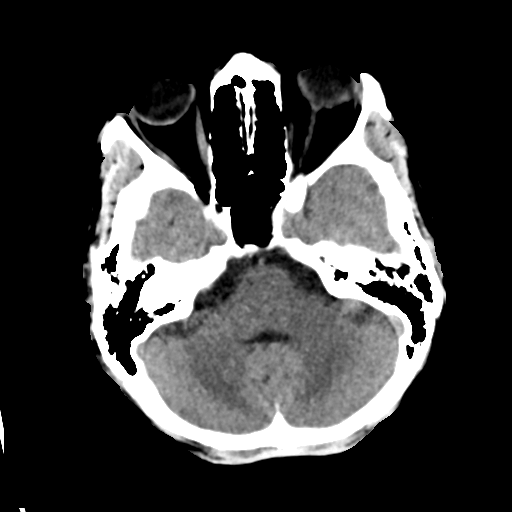
[im 13/34  brain]
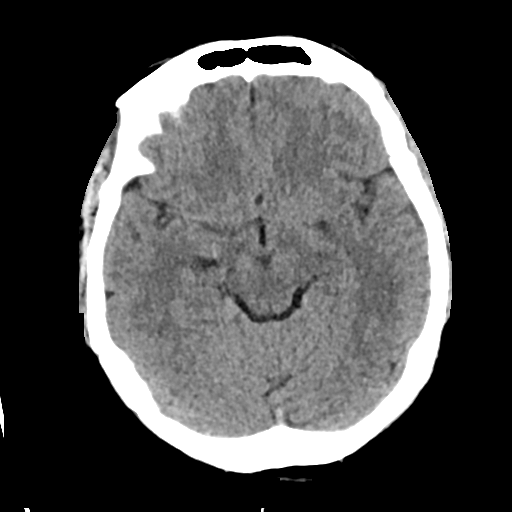
[im 17/34  brain]
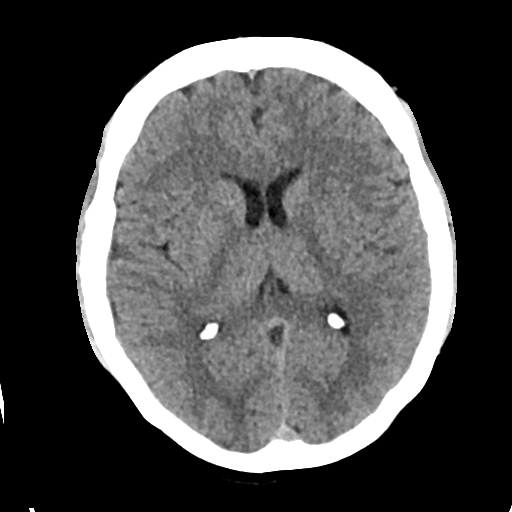
[im 21/34  brain]
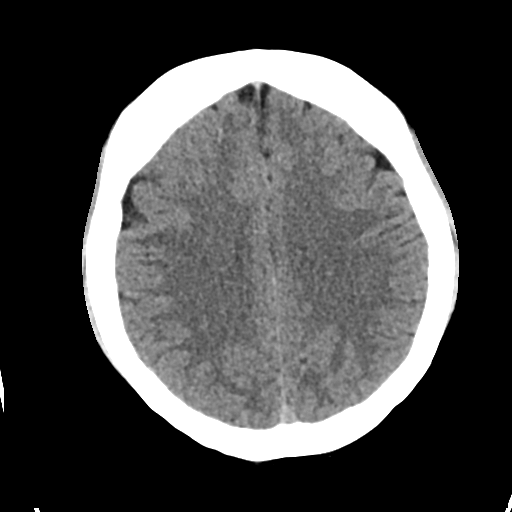
[im 21/34  bone]
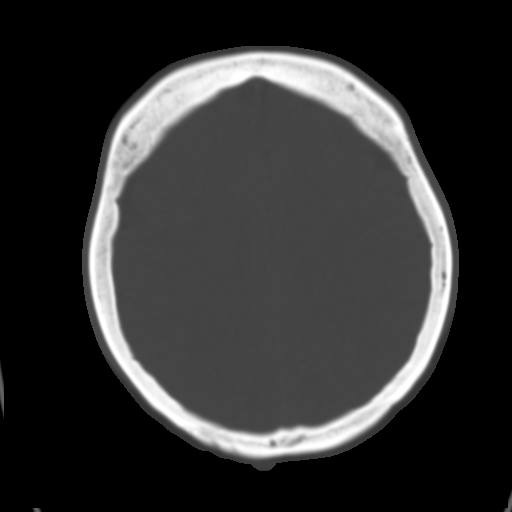
[im 25/34  brain]
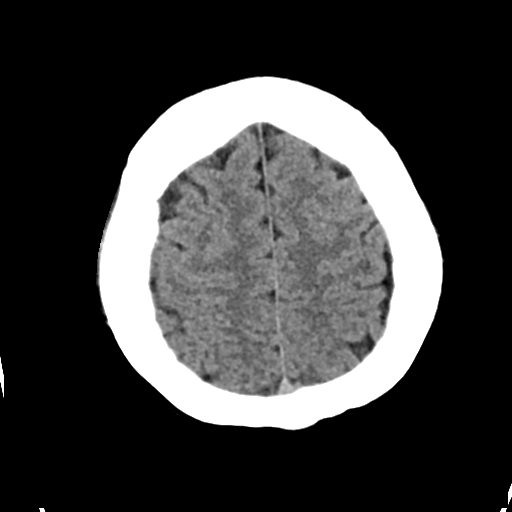
[im 29/34  brain]
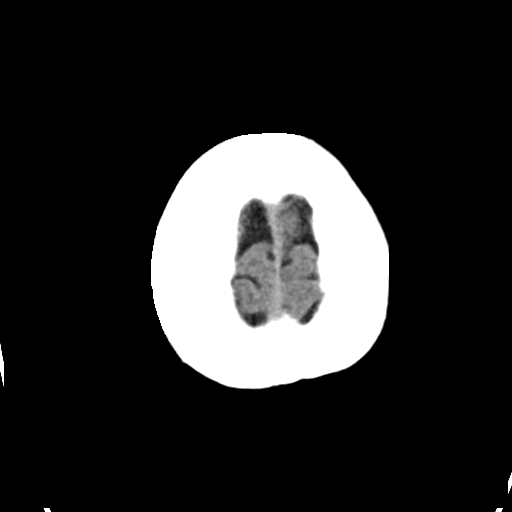

[Series 4: head bone · axial · 0.39mm/px · z∈[-118,-86]mm · 3 of 84 slices shown]
[im 9/84  bone]
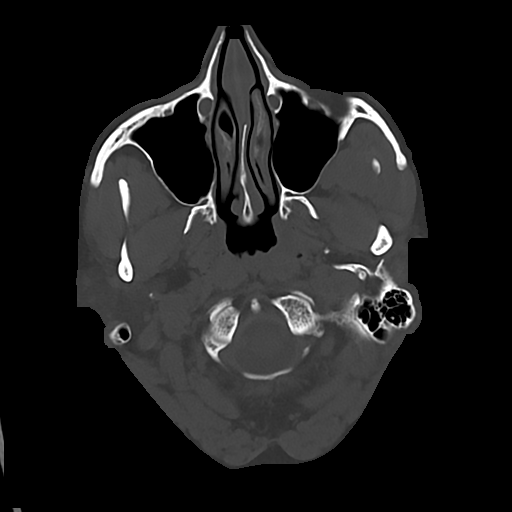
[im 17/84  bone]
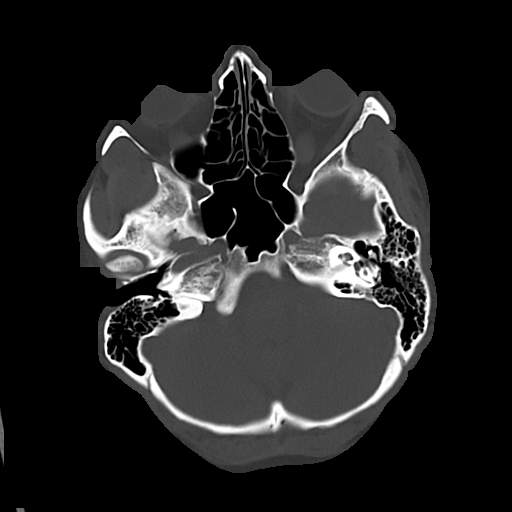
[im 25/84  bone]
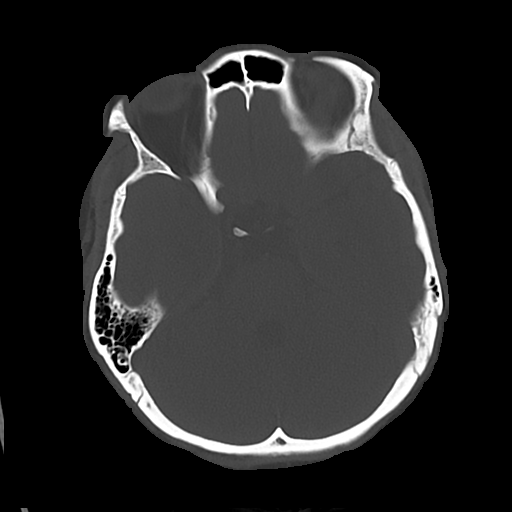

[Series 5: cor soft · coronal · 0.31mm/px · 3 of 72 slices shown]
[im 24/72  brain]
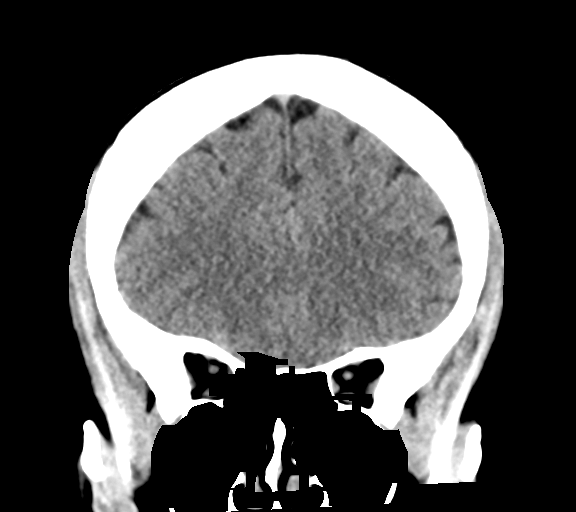
[im 32/72  brain]
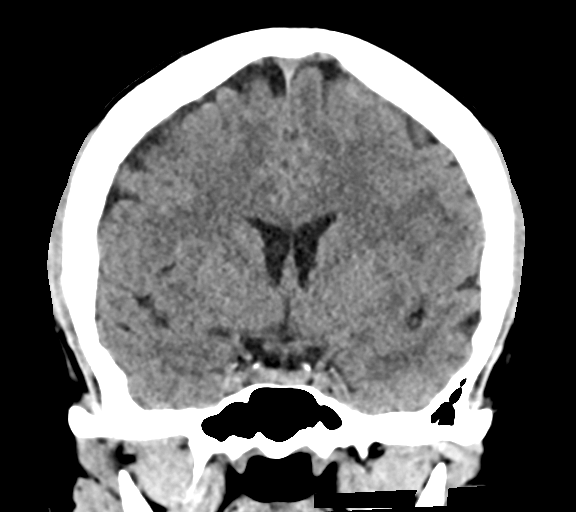
[im 40/72  brain]
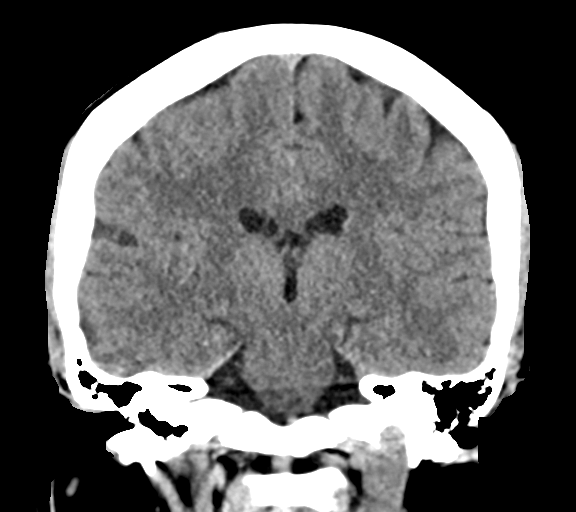

[Series 6: sag soft · sagittal · 0.31mm/px · 3 of 60 slices shown]
[im 20/60  brain]
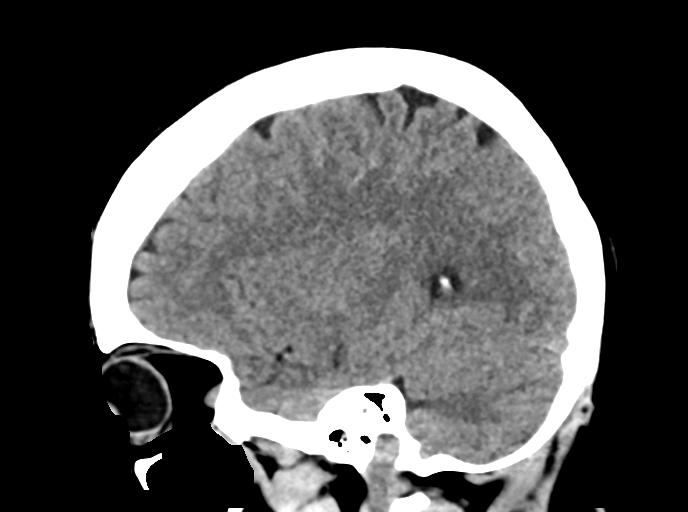
[im 30/60  brain]
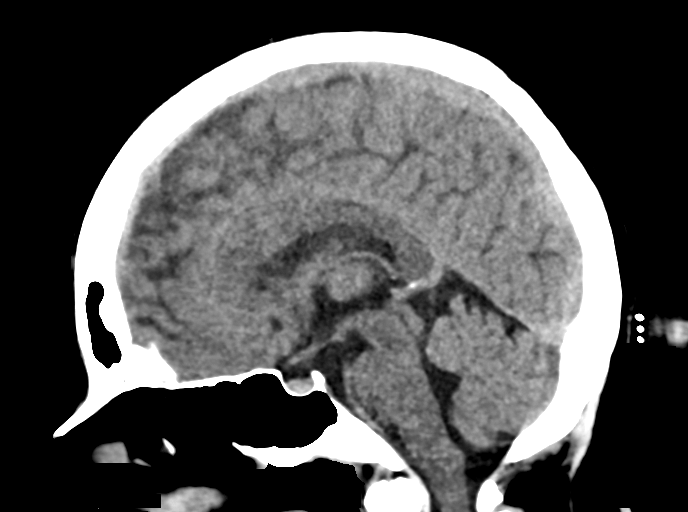
[im 40/60  brain]
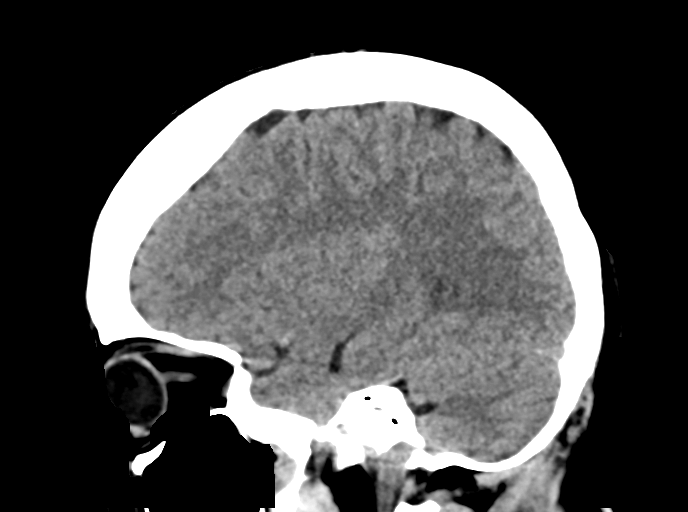

[16 of 47 positions shown; findings below may reference images not displayed]

FINDINGS: Brain:

No evidence of acute intracranial hemorrhage.

No demarcated cortical infarction.

No evidence of intracranial mass.

No midline shift or extra-axial fluid collection.

Well demarcated hypodensity within the inferior left basal ganglia
likely reflecting a prominent perivascular space.

Cerebral volume is normal for age.

Vascular: No hyperdense vessel.

Skull: Normal. Negative for fracture or focal lesion.

Sinuses/Orbits: Visualized orbits demonstrate no acute abnormality.
Scattered trace paranasal sinus mucosal thickening. No significant
mastoid effusion.
IMPRESSION: No evidence of acute intracranial abnormality.

## 2022-01-21 ENCOUNTER — Emergency Department (HOSPITAL_COMMUNITY)
Admission: EM | Admit: 2022-01-21 | Discharge: 2022-01-22 | Disposition: A | Discharge status: Home / Self Care | Payer: Self-pay | Attending: Emergency Medicine | Admitting: Emergency Medicine

## 2022-01-21 ENCOUNTER — Other Ambulatory Visit: Payer: Self-pay

## 2022-01-21 ENCOUNTER — Emergency Department (HOSPITAL_COMMUNITY): Payer: Self-pay

## 2022-01-21 ENCOUNTER — Ambulatory Visit
Admission: EM | Admit: 2022-01-21 | Discharge: 2022-01-21 | Disposition: A | Discharge status: Other Acute Inpatient Hospital | Payer: Self-pay | Attending: Internal Medicine | Admitting: Internal Medicine

## 2022-01-21 ENCOUNTER — Encounter (HOSPITAL_COMMUNITY): Payer: Self-pay

## 2022-01-21 ENCOUNTER — Encounter: Payer: Self-pay | Admitting: Emergency Medicine

## 2022-01-21 DIAGNOSIS — R079 Chest pain, unspecified: Secondary | ICD-10-CM

## 2022-01-21 DIAGNOSIS — N9489 Other specified conditions associated with female genital organs and menstrual cycle: Secondary | ICD-10-CM | POA: Insufficient documentation

## 2022-01-21 DIAGNOSIS — R0789 Other chest pain: Secondary | ICD-10-CM

## 2022-01-21 DIAGNOSIS — E119 Type 2 diabetes mellitus without complications: Secondary | ICD-10-CM | POA: Insufficient documentation

## 2022-01-21 LAB — BASIC METABOLIC PANEL
Anion gap: 8 (ref 5–15)
BUN: 13 mg/dL (ref 6–20)
CO2: 24 mmol/L (ref 22–32)
Calcium: 9.2 mg/dL (ref 8.9–10.3)
Chloride: 106 mmol/L (ref 98–111)
Creatinine, Ser: 0.64 mg/dL (ref 0.44–1.00)
GFR, Estimated: >60 mL/min (ref >60)
Glucose, Bld: 165 mg/dL — ABNORMAL HIGH (ref 70–99)
Potassium: 3.5 mmol/L (ref 3.5–5.1)
Sodium: 138 mmol/L (ref 135–145)

## 2022-01-21 LAB — CBC
HCT: 37.8 % (ref 36.0–46.0)
Hemoglobin: 11.4 g/dL — ABNORMAL LOW (ref 12.0–15.0)
MCH: 24.7 pg — ABNORMAL LOW (ref 26.0–34.0)
MCHC: 30.2 g/dL (ref 30.0–36.0)
MCV: 81.8 fL (ref 80.0–100.0)
Platelets: 343 10*3/uL (ref 150–400)
RBC: 4.62 MIL/uL (ref 3.87–5.11)
RDW: 15.4 % (ref 11.5–15.5)
WBC: 8.4 10*3/uL (ref 4.0–10.5)
nRBC: 0 % (ref 0.0–0.2)

## 2022-01-21 LAB — TROPONIN I (HIGH SENSITIVITY): Troponin I (High Sensitivity): <2 ng/L (ref <18)

## 2022-01-21 LAB — I-STAT BETA HCG BLOOD, ED (MC, WL, AP ONLY): I-stat hCG, quantitative: <5 m[IU]/mL (ref <5)

## 2022-01-21 NOTE — ED Provider Triage Note (Signed)
Emergency Medicine Provider Triage Evaluation Note  [name] , a 41 y.o. female  was evaluated in triage.  Pt complains of left-sided chest pain that radiates down left arm x1 day. Patient evaluated at [facility] prior to arrival and sent to the ED for further evaluation. No history of blood clots. No hormonal treatments. Denies cardiac history.   Review of Systems  Positive: CP Negative: fever  Physical Exam  BP 137/88 (BP Location: Left Arm)    Pulse 84    Temp 98.6 F (37 C) (Oral)    Resp 18    Ht 5\' 1"  (1.549 m)    SpO2 100%    BMI 27.25 kg/m  Gen:   Awake, no distress   Resp:  Normal effort  MSK:   Moves extremities without difficulty  Other:  Reproducible left-sided anterior chest wall tenderness  Medical Decision Making  Medically screening exam initiated at 7:57 PM.  Appropriate orders placed.  [name] was informed that the remainder of the evaluation will be completed by another provider, this initial triage assessment does not replace that evaluation, and the importance of remaining in the ED until their evaluation is complete.  Cardiac labs ordered. Possible MSK etiology.    Kristina Bouchard, PA-C 01/21/22 2000

## 2022-01-21 NOTE — ED Triage Notes (Signed)
Patient c/o left sided chest pain that radiates to her left shoulder pain x 1 day.  Pain is radiating down her left arm, cannot take a deep breath because of the pressure in her chest.  Patient has taken ASA 325mg  @ 5pm.

## 2022-01-21 NOTE — ED Triage Notes (Signed)
Pt complains mid chest pain x 2 days.

## 2022-01-21 NOTE — ED Provider Notes (Signed)
[name]    CSN: [id number] Arrival date & time: 01/21/22  1803      History   Chief Complaint Chief Complaint  Patient presents with   Chest Pain    HPI [name] is a 41 y.o. female.   Patient presents with left-sided chest pain that radiates down left arm that has been present for approximately 1 day.  Chest pain is rated 6/10 on pain scale and is described as a pressure in her chest.  Patient reports that pain is worsened on inspiration and she has associated shortness of breath with it.  Patient took aspirin at about 5 PM with no improvement.  Denies any cardiac history.  Denies aggravating or relieving factors to chest pain.  Denies associated headache, dizziness, blurred vision, nausea, vomiting.   Chest Pain  Past Medical History:  Diagnosis Date   Carpal tunnel syndrome on both sides 01/2016   Headache    "seasonal"   Headache syndrome 05/26/2019   Non-insulin dependent type 2 diabetes mellitus ([place])    states only uses Insulin as needed   Pneumonia due to COVID-19 virus    Pyelonephritis 11/23/2019    Patient Active Problem List   Diagnosis Date Noted   COVID-19 virus detected 08/14/2020   Pyelonephritis 11/24/2019   Lymphadenopathy    Headache syndrome 05/26/2019   DM type 2 (diabetes mellitus, type 2) ([place]) 08/02/2015   Hydronephrosis 08/10/2014   Hypokalemia 08/10/2014   Nausea and vomiting 08/10/2014   Pyelonephritis, acute 08/10/2014   Hyperglycemia 08/10/2014   DUB (dysfunctional uterine bleeding) 07/14/2014   Uterine fibroid 07/14/2014    Past Surgical History:  Procedure Laterality Date   BILATERAL CARPAL TUNNEL RELEASE Bilateral 02/06/2016   Procedure: BILATERAL CARPAL TUNNEL RELEASE;  Surgeon: Leandrew Koyanagi, MD;  Location: [facility];  Service: Orthopedics;  Laterality: Bilateral;   NO PAST SURGERIES      OB History     Gravida  5   Para  4   Term  2   Preterm  2   AB  0   Living  3       SAB      IAB      Ectopic      Multiple      Live Births  2            Home Medications    Prior to Admission medications   Medication Sig Start Date End Date Taking? Authorizing Provider  albuterol (VENTOLIN HFA) 108 (90 Base) MCG/ACT inhaler Inhale 2 puffs into the lungs every 6 (six) hours as needed for wheezing or shortness of breath. 08/28/20   Mayers, Cari S, PA-C  Levonorgestrel-Ethinyl Estradiol (AMETHIA) 0.15-0.03 &0.01 MG tablet TAKE 1 TABLET BY MOUTH DAILY. PLEASE FILL AS 90 DAY SUPPLY 10/03/20 10/03/21  Charlott Rakes, MD  lisinopril (ZESTRIL) 2.5 MG tablet TAKE 1 TABLET (2.5 MG TOTAL) BY MOUTH DAILY. 01/07/21 01/07/22  [name], NP  nitrofurantoin, macrocrystal-monohydrate, (MACROBID) 100 MG capsule Take 1 capsule (100 mg total) by mouth 2 (two) times daily. 08/08/21   Volney American, PA-C    Family History Family History  Problem Relation Age of Onset   Healthy Mother    Healthy Father    Diabetes Neg Hx    Hypertension Neg Hx    Hyperlipidemia Neg Hx     Social History Social History   Tobacco Use   Smoking status: Never   Smokeless tobacco: Never  Vaping Use   Vaping Use: Never used  Substance Use Topics   Alcohol use: No   Drug use: No     Allergies   Patient has no known allergies.   Review of Systems Review of Systems Per HPI  Physical Exam Triage Vital Signs ED Triage Vitals  Enc Vitals Group     BP 01/21/22 1825 131/69     Pulse Rate 01/21/22 1825 84     Resp 01/21/22 1825 18     Temp 01/21/22 1825 98.5 F (36.9 C)     Temp Source 01/21/22 1825 Oral     SpO2 01/21/22 1825 98 %     Weight --      Height 01/21/22 1827 5\' 1"  (1.549 m)     Head Circumference --      Peak Flow --      Pain Score 01/21/22 1826 6     Pain Loc --      Pain Edu? --      Excl. in [place]? --    No data found.  Updated Vital Signs BP 131/69 (BP Location: Left Arm)    Pulse 84    Temp 98.5 F (36.9 C) (Oral)    Resp 18    Ht 5'  1" (1.549 m)    SpO2 98%    BMI 27.25 kg/m   Visual Acuity Right Eye Distance:   Left Eye Distance:   Bilateral Distance:    Right Eye Near:   Left Eye Near:    Bilateral Near:     Physical Exam Constitutional:      General: She is not in acute distress.    Appearance: Normal appearance. She is not toxic-appearing or diaphoretic.  HENT:     Head: Normocephalic and atraumatic.  Eyes:     Extraocular Movements: Extraocular movements intact.     Conjunctiva/sclera: Conjunctivae normal.  Cardiovascular:     Rate and Rhythm: Normal rate and regular rhythm.     Pulses: Normal pulses.     Heart sounds: Normal heart sounds.  Pulmonary:     Effort: Pulmonary effort is normal. No respiratory distress.     Breath sounds: Normal breath sounds.  Chest:     Chest wall: Tenderness present.       Comments: Tenderness to palpation to left chest, left shoulder, left trapezius muscle. Neurological:     General: No focal deficit present.     Mental Status: She is alert and oriented to person, place, and time. Mental status is at baseline.     Cranial Nerves: Cranial nerves 2-12 are intact.     Sensory: Sensation is intact.     Motor: Motor function is intact.     Coordination: Coordination is intact.     Gait: Gait is intact.  Psychiatric:        Mood and Affect: Mood normal.        Behavior: Behavior normal.        Thought Content: Thought content normal.        Judgment: Judgment normal.     UC Treatments / Results  Labs (all labs ordered are listed, but only abnormal results are displayed) Labs Reviewed - No data to display  EKG   Radiology No results found.  Procedures Procedures (including critical care time)  Medications Ordered in UC Medications - No data to display  Initial Impression / Assessment and Plan / UC Course  I have reviewed the triage vital signs and the  nursing notes.  Pertinent labs & imaging results that were available during my care of the  patient were reviewed by me and considered in my medical decision making (see chart for details).     Patient's pain could be musculoskeletal in nature.  Although, do think that more worrisome etiologies involving the heart need to be ruled out given history and physical exam.  EKG showing sinus rhythm with ST or T wave abnormality.  Advised patient that she will need to go to the hospital for more extensive evaluation and management.  Suggested EMS transport but patient declined.  Risks associated with not going by EMS were discussed with patient.  Patient voiced understanding.  Patient left via self transport to the hospital.  Interpreter used throughout patient interaction. Final Clinical Impressions(s) / UC Diagnoses   Final diagnoses:  Other chest pain     Discharge Instructions      Please go to the emergency department as soon as you leave urgent care for further evaluation and management.    ED Prescriptions   None    PDMP not reviewed this encounter.   Teodora Medici, [place] 01/21/22 [phone]

## 2022-01-21 NOTE — Discharge Instructions (Signed)
Please go to the emergency department as soon as you leave urgent care for further evaluation and management. ?

## 2022-01-22 MED ORDER — IBUPROFEN 200 MG PO TABS
600.0000 mg | ORAL_TABLET | Freq: Once | ORAL | Status: AC
  Administered 2022-01-22: 600 mg via ORAL
  Filled 2022-01-22: qty 3

## 2022-01-22 MED ORDER — NAPROXEN 500 MG PO TABS: 500.0000 mg | ORAL_TABLET | Freq: Two times a day (BID) | ORAL | 0 refills | Status: DC

## 2022-01-22 NOTE — ED Provider Notes (Signed)
[facility] DEPT Provider Note   CSN: [id number] Arrival date & time: 01/21/22  1911     History  Chief Complaint  Patient presents with   Chest Pain    [name] is a 41 y.o. female.  Patient is a 41 year old female with past medical history of type 2 diabetes.  Patient presenting today with complaints of chest discomfort.  This has been occurring intermittently for the past 2 days.  She describes a pressure to the upper sternum and left upper chest that is worse when she breathes, moves, and palpates.  She denies any nausea, diaphoresis, or difficulty breathing.  She denies any injury or trauma.  She denies any calf pain or leg swelling.  The history is provided by the patient.  Chest Pain Pain location:  L chest Pain quality: pressure   Pain radiates to:  Does not radiate Pain severity:  Moderate Onset quality:  Sudden Duration:  2 days Timing:  Intermittent Progression:  Worsening Chronicity:  New Relieved by:  Nothing Worsened by:  Certain positions, deep breathing and movement Ineffective treatments:  None tried     Home Medications Prior to Admission medications   Medication Sig Start Date End Date Taking? Authorizing Provider  albuterol (VENTOLIN HFA) 108 (90 Base) MCG/ACT inhaler Inhale 2 puffs into the lungs every 6 (six) hours as needed for wheezing or shortness of breath. 08/28/20   Mayers, Cari S, PA-C  Levonorgestrel-Ethinyl Estradiol (AMETHIA) 0.15-0.03 &0.01 MG tablet TAKE 1 TABLET BY MOUTH DAILY. PLEASE FILL AS 90 DAY SUPPLY 10/03/20 10/03/21  Charlott Rakes, MD  lisinopril (ZESTRIL) 2.5 MG tablet TAKE 1 TABLET (2.5 MG TOTAL) BY MOUTH DAILY. 01/07/21 01/07/22  [name], NP  nitrofurantoin, macrocrystal-monohydrate, (MACROBID) 100 MG capsule Take 1 capsule (100 mg total) by mouth 2 (two) times daily. 08/08/21   Volney American, PA-C      Allergies    Patient has no known allergies.    Review of  Systems   Review of Systems  Cardiovascular:  Positive for chest pain.  All other systems reviewed and are negative.  Physical Exam Updated Vital Signs BP 123/74 (BP Location: Left Arm)    Pulse 71    Temp 98.6 F (37 C) (Oral)    Resp 16    Ht 5\' 1"  (1.549 m)    SpO2 100%    BMI 27.25 kg/m  Physical Exam Vitals and nursing note reviewed.  Constitutional:      General: She is not in acute distress.    Appearance: She is well-developed. She is not diaphoretic.  HENT:     Head: Normocephalic and atraumatic.  Cardiovascular:     Rate and Rhythm: Normal rate and regular rhythm.     Heart sounds: No murmur heard.   No friction rub. No gallop.  Pulmonary:     Effort: Pulmonary effort is normal. No respiratory distress.     Breath sounds: Normal breath sounds. No wheezing.     Comments: There is tenderness to palpation over the left upper chest and upper sternum that reproduces her symptoms.  There is no palpable abnormality.  Breath sounds are clear and equal. Abdominal:     General: Bowel sounds are normal. There is no distension.     Palpations: Abdomen is soft.     Tenderness: There is no abdominal tenderness.  Musculoskeletal:        General: Normal range of motion.     Cervical back: Normal  range of motion and neck supple.  Skin:    General: Skin is warm and dry.  Neurological:     General: No focal deficit present.     Mental Status: She is alert and oriented to person, place, and time.    ED Results / Procedures / Treatments   Labs (all labs ordered are listed, but only abnormal results are displayed) Labs Reviewed  BASIC METABOLIC PANEL - Abnormal; Notable for the following components:      Result Value   Glucose, Bld 165 (*)    All other components within normal limits  CBC - Abnormal; Notable for the following components:   Hemoglobin 11.4 (*)    MCH 24.7 (*)    All other components within normal limits  I-STAT BETA HCG BLOOD, ED ([facility], [facility], [facility] ONLY)  TROPONIN I  (HIGH SENSITIVITY)  TROPONIN I (HIGH SENSITIVITY)    EKG ED ECG REPORT   Date: 01/22/2022  Rate: 84  Rhythm: normal sinus rhythm  QRS Axis: normal  Intervals: normal  ST/T Wave abnormalities: normal  Conduction Disutrbances:none  Narrative Interpretation:   Old EKG Reviewed: unchanged  I have personally reviewed the EKG tracing and agree with the computerized printout as noted.   Radiology DG Chest 2 View  Result Date: 01/21/2022 CLINICAL DATA:  Chest pain EXAM: CHEST - 2 VIEW COMPARISON:  08/19/2020 FINDINGS: Heart size and mediastinal structures normal. Vascularity normal. Lungs clear without infiltrate or effusion. Mild scarring in the right lung base. IMPRESSION: No active cardiopulmonary disease. Electronically Signed   By: Franchot Gallo M.D.   On: 01/21/2022 19:53    Procedures Procedures    Medications Ordered in ED Medications  ibuprofen (ADVIL) tablet 600 mg (has no administration in time range)    ED Course/ Medical Decision Making/ A&P  This patient presents to the ED for concern of chest discomfort, this involves an extensive number of treatment options, and is a complaint that carries with it a high risk of complications and morbidity.  The differential diagnosis includes acute coronary syndrome, pulmonary embolism, aortic dissection, pneumothorax, costochondritis   Co morbidities that complicate the patient evaluation  None   Additional history obtained:  Additional history obtained with the assistance of family member present at bedside.  Patient speaks limited [place]. No outside records needed   Lab Tests:  I Ordered, and personally interpreted labs.  The pertinent results include: Unremarkable CBC, metabolic panel, troponin   Imaging Studies ordered:  I ordered imaging studies including chest x-ray I independently visualized and interpreted imaging which showed no acute process I agree with the radiologist interpretation   Cardiac  Monitoring:  The patient was maintained on a cardiac monitor.  I personally viewed and interpreted the cardiac monitored which showed an underlying rhythm of: Sinus rhythm   Medicines ordered and prescription drug management:  I ordered medication including Motrin for pain Reevaluation of the patient after these medicines showed that the patient improved I have reviewed the patients home medicines and have made adjustments as needed   Test Considered:  No other test considered   Critical Interventions:  None   Consultations Obtained:  No consultations obtained, however patient will be referred to cardiology as an outpatient   Problem List / ED Course:  Patient presenting with complaints of chest discomfort.  She describes a pressure to the upper chest and left upper chest for the past 2 days.  Her symptoms are reproducible with palpation, positioning, and breathing, and I highly suspect  a musculoskeletal etiology.  Her work-up here is unremarkable including EKG, laboratory studies, and troponin.  Chest x-ray is clear.  I feel as though patient can safely be discharged with anti-inflammatories and follow-up as needed.  Patient will be referred to cardiology as she describes several extended family members with cardiac issues in the past.   Reevaluation:  After the interventions noted above, I reevaluated the patient and found that they have : Improved   Social Determinants of Health:  None   Dispostion:  After consideration of the diagnostic results and the patients response to treatment, I feel that the patent would benefit from discharge with NSAIDs and cardiology follow-up.    Final Clinical Impression(s) / ED Diagnoses Final diagnoses:  None    Rx / DC Orders ED Discharge Orders     None         Veryl Speak, MD 01/22/22 [phone]

## 2022-01-22 NOTE — Discharge Instructions (Signed)
Begin taking naproxen as prescribed.  Follow-up with cardiology in the next week, and return to the ER if you develop worsening pain, difficulty breathing, high fever, or other new and concerning symptoms.

## 2022-02-11 ENCOUNTER — Other Ambulatory Visit: Payer: Self-pay

## 2022-02-11 ENCOUNTER — Ambulatory Visit
Admission: RE | Admit: 2022-02-11 | Discharge: 2022-02-11 | Disposition: A | Discharge status: Home / Self Care | Payer: Self-pay | Source: Ambulatory Visit | Attending: Physician Assistant | Admitting: Physician Assistant

## 2022-02-11 VITALS — BP 109/71 | HR 71 | Temp 98.3°F | Resp 18

## 2022-02-11 DIAGNOSIS — N3000 Acute cystitis without hematuria: Secondary | ICD-10-CM | POA: Insufficient documentation

## 2022-02-11 LAB — POCT URINALYSIS DIP (MANUAL ENTRY)
Glucose, UA: NEGATIVE mg/dL
Ketones, POC UA: NEGATIVE mg/dL
Nitrite, UA: NEGATIVE
Protein Ur, POC: NEGATIVE mg/dL
Spec Grav, UA: <=1.005 — AB (ref 1.010–1.025)
Urobilinogen, UA: 0.2 E.U./dL
pH, UA: 6 (ref 5.0–8.0)

## 2022-02-11 MED ORDER — NITROFURANTOIN MONOHYD MACRO 100 MG PO CAPS: 100.0000 mg | ORAL_CAPSULE | Freq: Two times a day (BID) | ORAL | 0 refills | Status: DC

## 2022-02-11 NOTE — ED Provider Notes (Signed)
[facility]    CSN: [id number] Arrival date & time: 02/11/22  1655      History   Chief Complaint Chief Complaint  Patient presents with   Appointment    1700   Flank Pain   Nausea    HPI [name] is a 41 y.o. female.   Patient here today for evaluation of right flank pain and urinary pressure, nausea and headache that started 4 days ago. She has felt fever at times. She has not had any abdominal pain. She has not taken medication for symptoms. She does have history of pyelonephritis.   The history is provided by the patient.  Flank Pain Pertinent negatives include no abdominal pain and no shortness of breath.   Past Medical History:  Diagnosis Date   Carpal tunnel syndrome on both sides 01/2016   Headache    "seasonal"   Headache syndrome 05/26/2019   Non-insulin dependent type 2 diabetes mellitus ([place])    states only uses Insulin as needed   Pneumonia due to COVID-19 virus    Pyelonephritis 11/23/2019    Patient Active Problem List   Diagnosis Date Noted   COVID-19 virus detected 08/14/2020   Pyelonephritis 11/24/2019   Lymphadenopathy    Headache syndrome 05/26/2019   DM type 2 (diabetes mellitus, type 2) ([place]) 08/02/2015   Hydronephrosis 08/10/2014   Hypokalemia 08/10/2014   Nausea and vomiting 08/10/2014   Pyelonephritis, acute 08/10/2014   Hyperglycemia 08/10/2014   DUB (dysfunctional uterine bleeding) 07/14/2014   Uterine fibroid 07/14/2014    Past Surgical History:  Procedure Laterality Date   BILATERAL CARPAL TUNNEL RELEASE Bilateral 02/06/2016   Procedure: BILATERAL CARPAL TUNNEL RELEASE;  Surgeon: Leandrew Koyanagi, MD;  Location: [facility];  Service: Orthopedics;  Laterality: Bilateral;   NO PAST SURGERIES      OB History     Gravida  5   Para  4   Term  2   Preterm  2   AB  0   Living  3      SAB      IAB      Ectopic      Multiple      Live Births  2            Home  Medications    Prior to Admission medications   Medication Sig Start Date End Date Taking? Authorizing Provider  nitrofurantoin, macrocrystal-monohydrate, (MACROBID) 100 MG capsule Take 1 capsule (100 mg total) by mouth 2 (two) times daily. 02/11/22  Yes Francene Finders, PA-C  albuterol (VENTOLIN HFA) 108 (90 Base) MCG/ACT inhaler Inhale 2 puffs into the lungs every 6 (six) hours as needed for wheezing or shortness of breath. 08/28/20   Mayers, Cari S, PA-C  aspirin 325 MG EC tablet Take 325 mg by mouth as needed (for chest pain).    [provider]  Levonorgestrel-Ethinyl Estradiol (AMETHIA) 0.15-0.03 &0.01 MG tablet TAKE 1 TABLET BY MOUTH DAILY. PLEASE FILL AS 90 DAY SUPPLY 10/03/20 01/22/22  Charlott Rakes, MD  lisinopril (ZESTRIL) 2.5 MG tablet TAKE 1 TABLET (2.5 MG TOTAL) BY MOUTH DAILY. 01/07/21 01/22/22  [name], NP  naproxen (NAPROSYN) 500 MG tablet Take 1 tablet (500 mg total) by mouth 2 (two) times daily. 01/22/22   Veryl Speak, MD    Family History Family History  Problem Relation Age of Onset   Healthy Mother    Healthy Father    Diabetes Neg Hx  Hypertension Neg Hx    Hyperlipidemia Neg Hx     Social History Social History   Tobacco Use   Smoking status: Never   Smokeless tobacco: Never  Vaping Use   Vaping Use: Never used  Substance Use Topics   Alcohol use: No   Drug use: No     Allergies   Patient has no known allergies.   Review of Systems Review of Systems  Constitutional:  Negative for chills and fever.  Eyes:  Negative for discharge and redness.  Respiratory:  Negative for shortness of breath and wheezing.   Gastrointestinal:  Positive for nausea. Negative for abdominal pain and vomiting.  Genitourinary:  Positive for flank pain. Negative for dysuria.    Physical Exam Triage Vital Signs ED Triage Vitals [02/11/22 1715]  Enc Vitals Group     BP 109/71     Pulse Rate 71     Resp 18     Temp 98.3 F (36.8 C)     Temp Source  Oral     SpO2 98 %     Weight      Height      Head Circumference      Peak Flow      Pain Score 5     Pain Loc      Pain Edu?      Excl. in [place]?    No data found.  Updated Vital Signs BP 109/71 (BP Location: Left Arm)    Pulse 71    Temp 98.3 F (36.8 C) (Oral)    Resp 18    SpO2 98%       Physical Exam Vitals and nursing note reviewed.  Constitutional:      General: She is not in acute distress.    Appearance: Normal appearance. She is not ill-appearing.  HENT:     Head: Normocephalic and atraumatic.     Nose: Nose normal.  Cardiovascular:     Rate and Rhythm: Normal rate and regular rhythm.     Heart sounds: Normal heart sounds. No murmur heard. Pulmonary:     Effort: Pulmonary effort is normal. No respiratory distress.     Breath sounds: Normal breath sounds. No wheezing, rhonchi or rales.  Skin:    General: Skin is warm and dry.  Neurological:     Mental Status: She is alert.  Psychiatric:        Mood and Affect: Mood normal.        Thought Content: Thought content normal.     UC Treatments / Results  Labs (all labs ordered are listed, but only abnormal results are displayed) Labs Reviewed  POCT URINALYSIS DIP (MANUAL ENTRY) - Abnormal; Notable for the following components:      Result Value   Color, UA colorless (*)    Clarity, UA cloudy (*)    Bilirubin, UA moderate (*)    Spec Grav, UA <=1.005 (*)    Blood, UA moderate (*)    Leukocytes, UA Large (3+) (*)    All other components within normal limits  URINE CULTURE    EKG   Radiology No results found.  Procedures Procedures (including critical care time)  Medications Ordered in UC Medications - No data to display  Initial Impression / Assessment and Plan / UC Course  I have reviewed the triage vital signs and the nursing notes.  Pertinent labs & imaging results that were available during my care of the patient were reviewed by me and  considered in my medical decision making (see chart  for details).    Antibiotic prescribed for treatment of UTI. Urine culture ordered. Recommend follow up with any further concerns and ED evaluation with any worsening.   Final Clinical Impressions(s) / UC Diagnoses   Final diagnoses:  Acute cystitis without hematuria   Discharge Instructions   None    ED Prescriptions     Medication Sig Dispense Auth. Provider   nitrofurantoin, macrocrystal-monohydrate, (MACROBID) 100 MG capsule Take 1 capsule (100 mg total) by mouth 2 (two) times daily. 10 capsule Francene Finders, PA-C      PDMP not reviewed this encounter.   Francene Finders, PA-C 02/11/22 [phone]

## 2022-02-11 NOTE — ED Triage Notes (Signed)
Pt here for flank pain with some pressure; nausea and HA x 4 days

## 2022-02-14 LAB — URINE CULTURE: Culture: 10000 — AB

## 2022-06-26 ENCOUNTER — Ambulatory Visit
Admission: RE | Admit: 2022-06-26 | Discharge: 2022-06-26 | Disposition: A | Discharge status: Home / Self Care | Payer: Self-pay | Source: Ambulatory Visit | Attending: Family Medicine | Admitting: Family Medicine

## 2022-06-26 VITALS — BP 119/76 | HR 82 | Temp 98.2°F | Resp 18

## 2022-06-26 DIAGNOSIS — G43911 Migraine, unspecified, intractable, with status migrainosus: Secondary | ICD-10-CM

## 2022-06-26 LAB — POCT URINE PREGNANCY: Preg Test, Ur: NEGATIVE

## 2022-06-26 MED ORDER — DEXAMETHASONE SODIUM PHOSPHATE 10 MG/ML IJ SOLN
10.0000 mg | Freq: Once | INTRAMUSCULAR | Status: AC
  Administered 2022-06-26: 10 mg via INTRAMUSCULAR

## 2022-06-26 MED ORDER — KETOROLAC TROMETHAMINE 30 MG/ML IJ SOLN
30.0000 mg | Freq: Once | INTRAMUSCULAR | Status: AC
  Administered 2022-06-26: 30 mg via INTRAMUSCULAR

## 2022-06-26 MED ORDER — METOCLOPRAMIDE HCL 5 MG/ML IJ SOLN
5.0000 mg | Freq: Once | INTRAMUSCULAR | Status: AC
  Administered 2022-06-26: 5 mg via INTRAMUSCULAR

## 2022-06-26 MED ORDER — SUMATRIPTAN SUCCINATE 6 MG/0.5ML ~~LOC~~ SOLN
6.0000 mg | Freq: Once | SUBCUTANEOUS | Status: AC
  Administered 2022-06-26: 6 mg via SUBCUTANEOUS

## 2022-06-26 NOTE — Discharge Instructions (Addendum)
Your pregnancy test was negative   Today you have been given a dose of Toradol, Reglan, Imitrex, and Decadron.  These are all to come the pain from what seems to be an migraine headache.  If you do not improve with this injection or if you worsen in any way, please proceed to the emergency room  I think you should at least follow-up with your primary care office after this has been treated.

## 2022-06-26 NOTE — ED Triage Notes (Signed)
Pt presents with ongoing migraine with some slight dizziness and nausea X 3 days.

## 2022-06-26 NOTE — ED Provider Notes (Signed)
[name]    CSN: [id number] Arrival date & time: 06/26/22  0857      History   Chief Complaint Chief Complaint  Patient presents with   Headache    I have 8 days with a headache and I get dizzy - Entered by patient    HPI [name] is a 41 y.o. female.    Headache  Here with a 7 to 9-day history of headache.  It is rated fairly severe sometimes it is on 1 side and sometimes its on the other.  Today it is more on her right side.  She has had some nausea.  And she has felt dizzy.  She does have some photophobia also  No syncope or loss of consciousness she states she has had a similar headache a long time ago.  Last menstrual cycle was July 3 and it only lasted 3 days.  Usually last 6 or 7 days.  She is no longer taking medication for diabetes or hypertension  Past Medical History:  Diagnosis Date   Carpal tunnel syndrome on both sides 01/2016   Headache    "seasonal"   Headache syndrome 05/26/2019   Non-insulin dependent type 2 diabetes mellitus ([place])    states only uses Insulin as needed   Pneumonia due to COVID-19 virus    Pyelonephritis 11/23/2019    Patient Active Problem List   Diagnosis Date Noted   COVID-19 virus detected 08/14/2020   Pyelonephritis 11/24/2019   Lymphadenopathy    Headache syndrome 05/26/2019   DM type 2 (diabetes mellitus, type 2) ([place]) 08/02/2015   Hydronephrosis 08/10/2014   Hypokalemia 08/10/2014   Nausea and vomiting 08/10/2014   Pyelonephritis, acute 08/10/2014   Hyperglycemia 08/10/2014   DUB (dysfunctional uterine bleeding) 07/14/2014   Uterine fibroid 07/14/2014    Past Surgical History:  Procedure Laterality Date   BILATERAL CARPAL TUNNEL RELEASE Bilateral 02/06/2016   Procedure: BILATERAL CARPAL TUNNEL RELEASE;  Surgeon: Leandrew Koyanagi, MD;  Location: [place];  Service: Orthopedics;  Laterality: Bilateral;   NO PAST SURGERIES      OB History     Gravida  5   Para  4    Term  2   Preterm  2   AB  0   Living  3      SAB      IAB      Ectopic      Multiple      Live Births  2            Home Medications    Prior to Admission medications   Medication Sig Start Date End Date Taking? Authorizing Provider  albuterol (VENTOLIN HFA) 108 (90 Base) MCG/ACT inhaler Inhale 2 puffs into the lungs every 6 (six) hours as needed for wheezing or shortness of breath. 08/28/20   Mayers, Cari S, PA-C  aspirin 325 MG EC tablet Take 325 mg by mouth as needed (for chest pain).    [provider]  Levonorgestrel-Ethinyl Estradiol (AMETHIA) 0.15-0.03 &0.01 MG tablet TAKE 1 TABLET BY MOUTH DAILY. PLEASE FILL AS 90 DAY SUPPLY 10/03/20 01/22/22  Charlott Rakes, MD  lisinopril (ZESTRIL) 2.5 MG tablet TAKE 1 TABLET (2.5 MG TOTAL) BY MOUTH DAILY. 01/07/21 01/22/22  [name], NP  naproxen (NAPROSYN) 500 MG tablet Take 1 tablet (500 mg total) by mouth 2 (two) times daily. 01/22/22   Veryl Speak, MD    Family History Family History  Problem Relation  Age of Onset   Healthy Mother    Healthy Father    Diabetes Neg Hx    Hypertension Neg Hx    Hyperlipidemia Neg Hx     Social History Social History   Tobacco Use   Smoking status: Never   Smokeless tobacco: Never  Vaping Use   Vaping Use: Never used  Substance Use Topics   Alcohol use: No   Drug use: No     Allergies   Patient has no known allergies.   Review of Systems Review of Systems  Neurological:  Positive for headaches.     Physical Exam Triage Vital Signs ED Triage Vitals  Enc Vitals Group     BP 06/26/22 0932 119/76     Pulse Rate 06/26/22 0932 82     Resp 06/26/22 0932 18     Temp 06/26/22 0932 98.2 F (36.8 C)     Temp Source 06/26/22 0932 Oral     SpO2 06/26/22 0932 98 %     Weight --      Height --      Head Circumference --      Peak Flow --      Pain Score 06/26/22 0931 7     Pain Loc --      Pain Edu? --      Excl. in [place]? --    No data  found.  Updated Vital Signs BP 119/76 (BP Location: Left Arm)   Pulse 82   Temp 98.2 F (36.8 C) (Oral)   Resp 18   LMP 06/16/2022   SpO2 98%   Visual Acuity Right Eye Distance:   Left Eye Distance:   Bilateral Distance:    Right Eye Near:   Left Eye Near:    Bilateral Near:     Physical Exam Vitals reviewed.  Constitutional:      General: She is not in acute distress.    Appearance: She is not toxic-appearing.  HENT:     Right Ear: Tympanic membrane and ear canal normal.     Left Ear: Tympanic membrane and ear canal normal.     Nose: Nose normal.     Mouth/Throat:     Mouth: Mucous membranes are moist.     Pharynx: No oropharyngeal exudate or posterior oropharyngeal erythema.  Eyes:     Extraocular Movements: Extraocular movements intact.     Conjunctiva/sclera: Conjunctivae normal.     Pupils: Pupils are equal, round, and reactive to light.     Funduscopic exam:    Right eye: No papilledema.        Left eye: No papilledema.  Cardiovascular:     Rate and Rhythm: Normal rate and regular rhythm.     Heart sounds: No murmur heard. Pulmonary:     Effort: Pulmonary effort is normal. No respiratory distress.     Breath sounds: No wheezing, rhonchi or rales.  Chest:     Chest wall: No tenderness.  Musculoskeletal:     Cervical back: Neck supple.  Lymphadenopathy:     Cervical: No cervical adenopathy.  Skin:    Capillary Refill: Capillary refill takes less than 2 seconds.     Coloration: Skin is not jaundiced or pale.  Neurological:     General: No focal deficit present.     Mental Status: She is alert and oriented to person, place, and time.     Cranial Nerves: No cranial nerve deficit.     Sensory: No sensory deficit.  Motor: No weakness.     Coordination: Coordination normal.     Gait: Gait normal.  Psychiatric:        Behavior: Behavior normal.      UC Treatments / Results  Labs (all labs ordered are listed, but only abnormal results are  displayed) Labs Reviewed  POCT URINE PREGNANCY    EKG   Radiology No results found.  Procedures Procedures (including critical care time)  Medications Ordered in UC Medications  ketorolac (TORADOL) 30 MG/ML injection 30 mg (has no administration in time range)  metoCLOPramide (REGLAN) injection 5 mg (has no administration in time range)  dexamethasone (DECADRON) injection 10 mg (has no administration in time range)  SUMAtriptan (IMITREX) injection 6 mg (has no administration in time range)    Initial Impression / Assessment and Plan / UC Course  I have reviewed the triage vital signs and the nursing notes.  Pertinent labs & imaging results that were available during my care of the patient were reviewed by me and considered in my medical decision making (see chart for details).     UPT is negative.  I am going to treat for intractable migraine.  Most of her symptoms point to this being migrainous in nature.  She is not improving, however she may need to proceed to the emergency room.  Also asked her to contact her primary care office to be seen in follow-up Final Clinical Impressions(s) / UC Diagnoses   Final diagnoses:  Intractable migraine with status migrainosus, unspecified migraine type     Discharge Instructions      Your pregnancy test was negative   Today you have been given a dose of Toradol, Reglan, Imitrex, and Decadron.  These are all to come the pain from what seems to be an migraine headache.  If you do not improve with this injection or if you worsen in any way, please proceed to the emergency room  I think you should at least follow-up with your primary care office after this has been treated.     ED Prescriptions   None    PDMP not reviewed this encounter.   Barrett Henle, MD 06/26/22 1000

## 2022-10-14 ENCOUNTER — Ambulatory Visit (INDEPENDENT_AMBULATORY_CARE_PROVIDER_SITE_OTHER): Payer: Self-pay

## 2022-10-14 ENCOUNTER — Ambulatory Visit
Admission: EM | Admit: 2022-10-14 | Discharge: 2022-10-14 | Disposition: A | Discharge status: Home / Self Care | Payer: Self-pay | Attending: Internal Medicine | Admitting: Internal Medicine

## 2022-10-14 DIAGNOSIS — R059 Cough, unspecified: Secondary | ICD-10-CM

## 2022-10-14 DIAGNOSIS — J208 Acute bronchitis due to other specified organisms: Secondary | ICD-10-CM

## 2022-10-14 DIAGNOSIS — R053 Chronic cough: Secondary | ICD-10-CM

## 2022-10-14 MED ORDER — PROMETHAZINE-DM 6.25-15 MG/5ML PO SYRP: 5.0000 mL | ORAL_SOLUTION | Freq: Every evening | ORAL | 0 refills | Status: DC | PRN

## 2022-10-14 MED ORDER — ALBUTEROL SULFATE HFA 108 (90 BASE) MCG/ACT IN AERS: 1.0000 | INHALATION_SPRAY | Freq: Four times a day (QID) | RESPIRATORY_TRACT | 0 refills | Status: DC | PRN

## 2022-10-14 MED ORDER — PREDNISONE 20 MG PO TABS: 40.0000 mg | ORAL_TABLET | Freq: Every day | ORAL | 0 refills | Status: AC

## 2022-10-14 MED ORDER — BENZONATATE 100 MG PO CAPS: 100.0000 mg | ORAL_CAPSULE | Freq: Three times a day (TID) | ORAL | 0 refills | Status: DC | PRN

## 2022-10-14 NOTE — ED Triage Notes (Signed)
Pt presents to uc with co of cough and congesting for 2 weeks. Pt reports last 5 days have been worse. Has attempted multiple otc cough medication without help.

## 2022-10-14 NOTE — ED Provider Notes (Signed)
[name]    CSN: [id number] Arrival date & time: 10/14/22  1855      History   Chief Complaint Chief Complaint  Patient presents with   Cough    I have been coughing a lot for the past 2 weeks but since 5 days ago it has been getting worse - Entered by patient    HPI [name] is a 41 y.o. female.   Patient presents with cough that has been present for approximately 2 weeks.  She reports that cough is mainly dry.  Reports nasal congestion that was present at start of symptoms and is now resolved.  Denies any known fevers at home.  Patient does report the cough has seemed to worsen over the last 5 days.  She has tried several over-the-counter cough medications with no improvement in symptoms.  Denies any known sick contacts.  Denies chest pain but does endorse some intermittent shortness of breath.  Denies history of asthma or COPD.   Cough   Past Medical History:  Diagnosis Date   Carpal tunnel syndrome on both sides 01/2016   Headache    "seasonal"   Headache syndrome 05/26/2019   Non-insulin dependent type 2 diabetes mellitus ([place])    states only uses Insulin as needed   Pneumonia due to COVID-19 virus    Pyelonephritis 11/23/2019    Patient Active Problem List   Diagnosis Date Noted   COVID-19 virus detected 08/14/2020   Pyelonephritis 11/24/2019   Lymphadenopathy    Headache syndrome 05/26/2019   DM type 2 (diabetes mellitus, type 2) ([place]) 08/02/2015   Hydronephrosis 08/10/2014   Hypokalemia 08/10/2014   Nausea and vomiting 08/10/2014   Pyelonephritis, acute 08/10/2014   Hyperglycemia 08/10/2014   DUB (dysfunctional uterine bleeding) 07/14/2014   Uterine fibroid 07/14/2014    Past Surgical History:  Procedure Laterality Date   BILATERAL CARPAL TUNNEL RELEASE Bilateral 02/06/2016   Procedure: BILATERAL CARPAL TUNNEL RELEASE;  Surgeon: Leandrew Koyanagi, MD;  Location: [place];  Service: Orthopedics;  Laterality:  Bilateral;   NO PAST SURGERIES      OB History     Gravida  5   Para  4   Term  2   Preterm  2   AB  0   Living  3      SAB      IAB      Ectopic      Multiple      Live Births  2            Home Medications    Prior to Admission medications   Medication Sig Start Date End Date Taking? Authorizing Provider  albuterol (VENTOLIN HFA) 108 (90 Base) MCG/ACT inhaler Inhale 1-2 puffs into the lungs every 6 (six) hours as needed for wheezing or shortness of breath. 10/14/22  Yes Cynthia Cogle, Hildred Alamin E, FNP  benzonatate (TESSALON) 100 MG capsule Take 1 capsule (100 mg total) by mouth every 8 (eight) hours as needed for cough. 10/14/22  Yes Yamile Roedl, Hildred Alamin E, FNP  predniSONE (DELTASONE) 20 MG tablet Take 2 tablets (40 mg total) by mouth daily for 5 days. 10/14/22 10/19/22 Yes Ismahan Lippman, Michele Rockers, FNP  promethazine-dextromethorphan (PROMETHAZINE-DM) 6.25-15 MG/5ML syrup Take 5 mLs by mouth at bedtime as needed for cough. 10/14/22  Yes Oswaldo Conroy E, FNP  aspirin 325 MG EC tablet Take 325 mg by mouth as needed (for chest pain).    [provider]  Levonorgestrel-Ethinyl Estradiol (  AMETHIA) 0.15-0.03 &0.01 MG tablet TAKE 1 TABLET BY MOUTH DAILY. PLEASE FILL AS 90 DAY SUPPLY 10/03/20 01/22/22  Charlott Rakes, MD  lisinopril (ZESTRIL) 2.5 MG tablet TAKE 1 TABLET (2.5 MG TOTAL) BY MOUTH DAILY. 01/07/21 01/22/22  [name], NP  naproxen (NAPROSYN) 500 MG tablet Take 1 tablet (500 mg total) by mouth 2 (two) times daily. 01/22/22   Veryl Speak, MD    Family History Family History  Problem Relation Age of Onset   Healthy Mother    Healthy Father    Diabetes Neg Hx    Hypertension Neg Hx    Hyperlipidemia Neg Hx     Social History Social History   Tobacco Use   Smoking status: Never   Smokeless tobacco: Never  Vaping Use   Vaping Use: Never used  Substance Use Topics   Alcohol use: No   Drug use: No     Allergies   Patient has no known allergies.   Review of  Systems Review of Systems Per HPI  Physical Exam Triage Vital Signs ED Triage Vitals  Enc Vitals Group     BP 10/14/22 1932 129/77     Pulse Rate 10/14/22 1932 92     Resp 10/14/22 1932 18     Temp 10/14/22 1932 98.4 F (36.9 C)     Temp src --      SpO2 10/14/22 1932 98 %     Weight --      Height --      Head Circumference --      Peak Flow --      Pain Score 10/14/22 1931 3     Pain Loc --      Pain Edu? --      Excl. in [place]? --    No data found.  Updated Vital Signs BP 129/77   Pulse 92   Temp 98.4 F (36.9 C)   Resp 18   LMP 09/30/2022 (Approximate)   SpO2 98%   Visual Acuity Right Eye Distance:   Left Eye Distance:   Bilateral Distance:    Right Eye Near:   Left Eye Near:    Bilateral Near:     Physical Exam Constitutional:      General: She is not in acute distress.    Appearance: Normal appearance. She is not toxic-appearing or diaphoretic.  HENT:     Head: Normocephalic and atraumatic.     Right Ear: Tympanic membrane and ear canal normal.     Left Ear: Tympanic membrane and ear canal normal.     Nose: No congestion.     Mouth/Throat:     Mouth: Mucous membranes are moist.     Pharynx: No posterior oropharyngeal erythema.  Eyes:     Extraocular Movements: Extraocular movements intact.     Conjunctiva/sclera: Conjunctivae normal.     Pupils: Pupils are equal, round, and reactive to light.  Cardiovascular:     Rate and Rhythm: Normal rate and regular rhythm.     Pulses: Normal pulses.     Heart sounds: Normal heart sounds.  Pulmonary:     Effort: Pulmonary effort is normal. No respiratory distress.     Breath sounds: Normal breath sounds. No stridor. No wheezing, rhonchi or rales.     Comments: Harsh cough noted on exam.  Abdominal:     General: Abdomen is flat. Bowel sounds are normal.     Palpations: Abdomen is soft.  Musculoskeletal:  General: Normal range of motion.     Cervical back: Normal range of motion.  Skin:    General:  Skin is warm and dry.  Neurological:     General: No focal deficit present.     Mental Status: She is alert and oriented to person, place, and time. Mental status is at baseline.  Psychiatric:        Mood and Affect: Mood normal.        Behavior: Behavior normal.      UC Treatments / Results  Labs (all labs ordered are listed, but only abnormal results are displayed) Labs Reviewed - No data to display  EKG   Radiology DG Chest 2 View  Result Date: 10/14/2022 CLINICAL DATA:  Cough EXAM: CHEST - 2 VIEW COMPARISON:  01/21/2022 FINDINGS: Mild bibasilar linear scarring. Lungs are otherwise clear. No pneumothorax or pleural effusion. Cardiac size within normal limits. Pulmonary vascularity is normal. No acute bone abnormality. IMPRESSION: 1. No radiographic evidence of acute cardiopulmonary disease. Electronically Signed   By: Fidela Salisbury M.D.   On: 10/14/2022 20:09    Procedures Procedures (including critical care time)  Medications Ordered in UC Medications - No data to display  Initial Impression / Assessment and Plan / UC Course  I have reviewed the triage vital signs and the nursing notes.  Pertinent labs & imaging results that were available during my care of the patient were reviewed by me and considered in my medical decision making (see chart for details).     Chest x-ray was negative for any acute cardiopulmonary process.  Suspect viral bronchitis as cause of patient's persistent cough.  Will treat with prednisone, albuterol, cough medication.  Patient denies that she takes any daily medications and denies any chronic health problems so this regimen should be safe.  Advised patient that cough medication can cause drowsiness.  Patient to follow-up if symptoms persist or worsen.  No signs of hypoxia, no tachypnea, oxygen is normal so do not think that emergent evaluation is necessary.  Patient was given strict return and ER precautions.  Patient verbalized understanding and  was agreeable with plan. Final Clinical Impressions(s) / UC Diagnoses   Final diagnoses:  Persistent cough  Viral bronchitis     Discharge Instructions      Your chest x-ray was normal.  I suspect that you have viral bronchitis. I have sent you 4 medications to help alleviate symptoms.  Please follow-up if symptoms persist or worsen.     ED Prescriptions     Medication Sig Dispense Auth. Provider   predniSONE (DELTASONE) 20 MG tablet Take 2 tablets (40 mg total) by mouth daily for 5 days. 10 tablet [place], [place] E, [place]   promethazine-dextromethorphan (PROMETHAZINE-DM) 6.25-15 MG/5ML syrup Take 5 mLs by mouth at bedtime as needed for cough. 118 mL [place]al [place], [place] E, [place]   benzonatate (TESSALON) 100 MG capsule Take 1 capsule (100 mg total) by mouth every 8 (eight) hours as needed for cough. 21 capsule [place], [place] E, [place]   albuterol (VENTOLIN HFA) 108 (90 Base) MCG/ACT inhaler Inhale 1-2 puffs into the lungs every 6 (six) hours as needed for wheezing or shortness of breath. 1 each Teodora Medici, [place]      PDMP not reviewed this encounter.   Teodora Medici, Candelaria Arenas 10/14/22 2018

## 2022-10-14 NOTE — Discharge Instructions (Signed)
Your chest x-ray was normal.  I suspect that you have viral bronchitis. I have sent you 4 medications to help alleviate symptoms.  Please follow-up if symptoms persist or worsen.

## 2022-12-06 ENCOUNTER — Ambulatory Visit
Admission: RE | Admit: 2022-12-06 | Discharge: 2022-12-06 | Disposition: A | Discharge status: Home / Self Care | Payer: Self-pay | Source: Ambulatory Visit | Attending: Physician Assistant | Admitting: Physician Assistant

## 2022-12-06 VITALS — BP 100/70 | HR 80 | Temp 98.0°F | Resp 16

## 2022-12-06 DIAGNOSIS — J069 Acute upper respiratory infection, unspecified: Secondary | ICD-10-CM | POA: Insufficient documentation

## 2022-12-06 DIAGNOSIS — Z1152 Encounter for screening for COVID-19: Secondary | ICD-10-CM | POA: Insufficient documentation

## 2022-12-06 LAB — SARS CORONAVIRUS 2 (TAT 6-24 HRS): SARS Coronavirus 2: NEGATIVE

## 2022-12-06 MED ORDER — BENZONATATE 100 MG PO CAPS: 100.0000 mg | ORAL_CAPSULE | Freq: Three times a day (TID) | ORAL | 0 refills | Status: DC | PRN

## 2022-12-06 NOTE — ED Provider Notes (Signed)
[name]    CSN: [id number] Arrival date & time: 12/06/22  1254      History   Chief Complaint Chief Complaint  Patient presents with   Cough    Entered by patient    HPI [name] is a 41 y.o. female.   Patient here today for evaluation of cough, congestion that started a few days ago.  She reports that a few weeks ago she did have diarrhea but none since then.  She has not had any fever.  She states that she has had some sore throat.  She denies any vomiting. She has tried OTC meds without resolution.   The history is provided by the patient.  Cough Associated symptoms: sore throat   Associated symptoms: no chills, no ear pain, no eye discharge, no fever, no shortness of breath and no wheezing     Past Medical History:  Diagnosis Date   Carpal tunnel syndrome on both sides 01/2016   Headache    "seasonal"   Headache syndrome 05/26/2019   Non-insulin dependent type 2 diabetes mellitus ([place])    states only uses Insulin as needed   Pneumonia due to COVID-19 virus    Pyelonephritis 11/23/2019    Patient Active Problem List   Diagnosis Date Noted   COVID-19 virus detected 08/14/2020   Pyelonephritis 11/24/2019   Lymphadenopathy    Headache syndrome 05/26/2019   DM type 2 (diabetes mellitus, type 2) ([place]) 08/02/2015   Hydronephrosis 08/10/2014   Hypokalemia 08/10/2014   Nausea and vomiting 08/10/2014   Pyelonephritis, acute 08/10/2014   Hyperglycemia 08/10/2014   DUB (dysfunctional uterine bleeding) 07/14/2014   Uterine fibroid 07/14/2014    Past Surgical History:  Procedure Laterality Date   BILATERAL CARPAL TUNNEL RELEASE Bilateral 02/06/2016   Procedure: BILATERAL CARPAL TUNNEL RELEASE;  Surgeon: Leandrew Koyanagi, MD;  Location: [facility];  Service: Orthopedics;  Laterality: Bilateral;   NO PAST SURGERIES      OB History     Gravida  5   Para  4   Term  2   Preterm  2   AB  0   Living  3      SAB       IAB      Ectopic      Multiple      Live Births  2            Home Medications    Prior to Admission medications   Medication Sig Start Date End Date Taking? Authorizing Provider  albuterol (VENTOLIN HFA) 108 (90 Base) MCG/ACT inhaler Inhale 1-2 puffs into the lungs every 6 (six) hours as needed for wheezing or shortness of breath. 10/14/22   Teodora Medici, FNP  aspirin 325 MG EC tablet Take 325 mg by mouth as needed (for chest pain).    [provider]  benzonatate (TESSALON) 100 MG capsule Take 1 capsule (100 mg total) by mouth every 8 (eight) hours as needed for cough. 12/06/22   Francene Finders, PA-C  naproxen (NAPROSYN) 500 MG tablet Take 1 tablet (500 mg total) by mouth 2 (two) times daily. 01/22/22   Veryl Speak, MD    Family History Family History  Problem Relation Age of Onset   Healthy Mother    Healthy Father    Diabetes Neg Hx    Hypertension Neg Hx    Hyperlipidemia Neg Hx     Social History Social History   Tobacco  Use   Smoking status: Never   Smokeless tobacco: Never  Vaping Use   Vaping Use: Never used  Substance Use Topics   Alcohol use: No   Drug use: No     Allergies   Patient has no known allergies.   Review of Systems Review of Systems  Constitutional:  Negative for chills and fever.  HENT:  Positive for congestion and sore throat. Negative for ear pain.   Eyes:  Negative for discharge and redness.  Respiratory:  Positive for cough. Negative for shortness of breath and wheezing.   Gastrointestinal:  Negative for abdominal pain, diarrhea, nausea and vomiting.     Physical Exam Triage Vital Signs ED Triage Vitals [12/06/22 1312]  Enc Vitals Group     BP 100/70     Pulse Rate 80     Resp 16     Temp 98 F (36.7 C)     Temp Source Oral     SpO2 98 %     Weight      Height      Head Circumference      Peak Flow      Pain Score 8     Pain Loc      Pain Edu?      Excl. in [place]?    No data  found.  Updated Vital Signs BP 100/70 (BP Location: Left Arm)   Pulse 80   Temp 98 F (36.7 C) (Oral)   Resp 16   SpO2 98%     Physical Exam Vitals and nursing note reviewed.  Constitutional:      General: She is not in acute distress.    Appearance: Normal appearance. She is not ill-appearing.  HENT:     Head: Normocephalic and atraumatic.     Right Ear: Tympanic membrane normal.     Left Ear: Tympanic membrane normal.     Nose: Congestion present.     Mouth/Throat:     Mouth: Mucous membranes are moist.     Pharynx: No oropharyngeal exudate or posterior oropharyngeal erythema.  Eyes:     Conjunctiva/sclera: Conjunctivae normal.  Cardiovascular:     Rate and Rhythm: Normal rate and regular rhythm.     Heart sounds: Normal heart sounds. No murmur heard. Pulmonary:     Effort: Pulmonary effort is normal. No respiratory distress.     Breath sounds: Normal breath sounds. No wheezing, rhonchi or rales.  Skin:    General: Skin is warm and dry.  Neurological:     Mental Status: She is alert.  Psychiatric:        Mood and Affect: Mood normal.        Thought Content: Thought content normal.      UC Treatments / Results  Labs (all labs ordered are listed, but only abnormal results are displayed) Labs Reviewed  SARS CORONAVIRUS 2 (TAT 6-24 HRS)    EKG   Radiology No results found.  Procedures Procedures (including critical care time)  Medications Ordered in UC Medications - No data to display  Initial Impression / Assessment and Plan / UC Course  I have reviewed the triage vital signs and the nursing notes.  Pertinent labs & imaging results that were available during my care of the patient were reviewed by me and considered in my medical decision making (see chart for details).    Suspect viral etiology of symptoms.  Will order COVID screening.  Tessalon Perles prescribed for cough.  Encouraged follow-up if  no gradual improvement or with any further  concerns.  Final Clinical Impressions(s) / UC Diagnoses   Final diagnoses:  Acute upper respiratory infection  Encounter for screening for COVID-19   Discharge Instructions   None    ED Prescriptions     Medication Sig Dispense Auth. Provider   benzonatate (TESSALON) 100 MG capsule Take 1 capsule (100 mg total) by mouth every 8 (eight) hours as needed for cough. 21 capsule Francene Finders, PA-C      PDMP not reviewed this encounter.   Francene Finders, PA-C 12/06/22 1453

## 2022-12-06 NOTE — ED Triage Notes (Signed)
Pt c/o cough onset ~ Tuesday. Also had diarrhea ~ 2 weeks ago but has resolved. Denies sore throat, nasal congestion, headache.

## 2022-12-15 ENCOUNTER — Other Ambulatory Visit: Payer: Self-pay

## 2022-12-15 ENCOUNTER — Emergency Department (HOSPITAL_COMMUNITY): Payer: Self-pay

## 2022-12-15 ENCOUNTER — Emergency Department (HOSPITAL_COMMUNITY)
Admission: EM | Admit: 2022-12-15 | Discharge: 2022-12-15 | Disposition: A | Discharge status: Home / Self Care | Payer: Self-pay | Attending: Emergency Medicine | Admitting: Emergency Medicine

## 2022-12-15 ENCOUNTER — Encounter (HOSPITAL_COMMUNITY): Payer: Self-pay

## 2022-12-15 DIAGNOSIS — Z1152 Encounter for screening for COVID-19: Secondary | ICD-10-CM | POA: Insufficient documentation

## 2022-12-15 DIAGNOSIS — Z7982 Long term (current) use of aspirin: Secondary | ICD-10-CM | POA: Insufficient documentation

## 2022-12-15 DIAGNOSIS — B338 Other specified viral diseases: Secondary | ICD-10-CM | POA: Insufficient documentation

## 2022-12-15 DIAGNOSIS — E119 Type 2 diabetes mellitus without complications: Secondary | ICD-10-CM | POA: Insufficient documentation

## 2022-12-15 LAB — URINALYSIS, ROUTINE W REFLEX MICROSCOPIC
Bilirubin Urine: NEGATIVE
Glucose, UA: NEGATIVE mg/dL
Ketones, ur: 5 mg/dL — AB
Nitrite: NEGATIVE
Protein, ur: 100 mg/dL — AB
Specific Gravity, Urine: 1.01 (ref 1.005–1.030)
WBC, UA: >50 WBC/hpf — ABNORMAL HIGH (ref 0–5)
pH: 5 (ref 5.0–8.0)

## 2022-12-15 LAB — COMPREHENSIVE METABOLIC PANEL
ALT: 14 U/L (ref 0–44)
AST: 21 U/L (ref 15–41)
Albumin: 3.7 g/dL (ref 3.5–5.0)
Alkaline Phosphatase: 84 U/L (ref 38–126)
Anion gap: 11 (ref 5–15)
BUN: <5 mg/dL — ABNORMAL LOW (ref 6–20)
CO2: 21 mmol/L — ABNORMAL LOW (ref 22–32)
Calcium: 8.8 mg/dL — ABNORMAL LOW (ref 8.9–10.3)
Chloride: 104 mmol/L (ref 98–111)
Creatinine, Ser: 0.74 mg/dL (ref 0.44–1.00)
GFR, Estimated: >60 mL/min (ref >60)
Glucose, Bld: 154 mg/dL — ABNORMAL HIGH (ref 70–99)
Potassium: 3.6 mmol/L (ref 3.5–5.1)
Sodium: 136 mmol/L (ref 135–145)
Total Bilirubin: 0.3 mg/dL (ref 0.3–1.2)
Total Protein: 7.5 g/dL (ref 6.5–8.1)

## 2022-12-15 LAB — CBC WITH DIFFERENTIAL/PLATELET
Abs Immature Granulocytes: 0.02 10*3/uL (ref 0.00–0.07)
Basophils Absolute: 0.1 10*3/uL (ref 0.0–0.1)
Basophils Relative: 1 %
Eosinophils Absolute: 0.1 10*3/uL (ref 0.0–0.5)
Eosinophils Relative: 1 %
HCT: 37 % (ref 36.0–46.0)
Hemoglobin: 11.1 g/dL — ABNORMAL LOW (ref 12.0–15.0)
Immature Granulocytes: 0 %
Lymphocytes Relative: 30 %
Lymphs Abs: 1.8 10*3/uL (ref 0.7–4.0)
MCH: 21.9 pg — ABNORMAL LOW (ref 26.0–34.0)
MCHC: 30 g/dL (ref 30.0–36.0)
MCV: 73 fL — ABNORMAL LOW (ref 80.0–100.0)
Monocytes Absolute: 0.8 10*3/uL (ref 0.1–1.0)
Monocytes Relative: 13 %
Neutro Abs: 3.5 10*3/uL (ref 1.7–7.7)
Neutrophils Relative %: 55 %
Platelets: 470 10*3/uL — ABNORMAL HIGH (ref 150–400)
RBC: 5.07 MIL/uL (ref 3.87–5.11)
RDW: 16.2 % — ABNORMAL HIGH (ref 11.5–15.5)
WBC: 6.2 10*3/uL (ref 4.0–10.5)
nRBC: 0 % (ref 0.0–0.2)

## 2022-12-15 LAB — RESP PANEL BY RT-PCR (RSV, FLU A&B, COVID)  RVPGX2
Influenza A by PCR: NEGATIVE
Influenza B by PCR: NEGATIVE
Resp Syncytial Virus by PCR: POSITIVE — AB
SARS Coronavirus 2 by RT PCR: NEGATIVE

## 2022-12-15 LAB — HCG, QUANTITATIVE, PREGNANCY: hCG, Beta Chain, Quant, S: <1 m[IU]/mL (ref <5)

## 2022-12-15 MED ORDER — ONDANSETRON 4 MG PO TBDP: 4.0000 mg | ORAL_TABLET | Freq: Three times a day (TID) | ORAL | 0 refills | Status: DC | PRN

## 2022-12-15 MED ORDER — ONDANSETRON 4 MG PO TBDP
4.0000 mg | ORAL_TABLET | Freq: Once | ORAL | Status: AC
  Administered 2022-12-15: 4 mg via ORAL
  Filled 2022-12-15: qty 1

## 2022-12-15 NOTE — ED Provider Notes (Signed)
[facility] DEPT Provider Note   CSN: [id number] Arrival date & time: 12/15/22  1344     History  Chief Complaint  Patient presents with   Cough   Left Ear Pain   Nausea   Possible Pregnancy    [name] is a 42 y.o. female.   Cough Possible Pregnancy     42 year old female with medical history significant for DM 2 who presents to the emergency department with a cough.  The patient history was provided with the aid of a Spanish language telemetry interpreter.  She states that she has had roughly 2 weeks of a cough and nasal congestion.  She been taking Tessalon Perles for cough but states that her symptoms have persisted.  She developed vomiting yesterday, denies any abdominal pain denies any active nausea, denies any genitourinary symptoms.  She denies any fevers or chills.  She endorses a persistent productive cough.  She also states that she developed left ear pain yesterday.  She is tolerating oral intake.  She denies any chest pain.  He was also concerned that she had missed her period and was worried she could be pregnant.  Her last menstrual period was on 12/12.  Home Medications Prior to Admission medications   Medication Sig Start Date End Date Taking? Authorizing Provider  ondansetron (ZOFRAN-ODT) 4 MG disintegrating tablet Take 1 tablet (4 mg total) by mouth every 8 (eight) hours as needed for nausea or vomiting. 12/15/22  Yes Regan Lemming, MD  albuterol (VENTOLIN HFA) 108 (90 Base) MCG/ACT inhaler Inhale 1-2 puffs into the lungs every 6 (six) hours as needed for wheezing or shortness of breath. 10/14/22   Teodora Medici, FNP  aspirin 325 MG EC tablet Take 325 mg by mouth as needed (for chest pain).    [provider]  benzonatate (TESSALON) 100 MG capsule Take 1 capsule (100 mg total) by mouth every 8 (eight) hours as needed for cough. 12/06/22   Francene Finders, PA-C  naproxen (NAPROSYN) 500 MG tablet Take 1 tablet  (500 mg total) by mouth 2 (two) times daily. 01/22/22   Veryl Speak, MD      Allergies    Patient has no known allergies.    Review of Systems   Review of Systems  Respiratory:  Positive for cough.   All other systems reviewed and are negative.   Physical Exam Updated Vital Signs BP 113/77   Pulse 77   Temp 98.2 F (36.8 C) (Oral)   Resp 18   SpO2 98%  Physical Exam Vitals and nursing note reviewed.  Constitutional:      General: She is not in acute distress.    Appearance: She is well-developed.  HENT:     Head: Normocephalic and atraumatic.     Right Ear: Tympanic membrane and ear canal normal.     Left Ear: Tympanic membrane and ear canal normal.  Eyes:     Conjunctiva/sclera: Conjunctivae normal.  Cardiovascular:     Rate and Rhythm: Normal rate and regular rhythm.  Pulmonary:     Effort: Pulmonary effort is normal. No respiratory distress.     Breath sounds: Normal breath sounds.  Abdominal:     Palpations: Abdomen is soft.     Tenderness: There is no abdominal tenderness.  Musculoskeletal:        General: No swelling.     Cervical back: Neck supple.  Skin:    General: Skin is warm and dry.  Capillary Refill: Capillary refill takes less than 2 seconds.  Neurological:     Mental Status: She is alert.  Psychiatric:        Mood and Affect: Mood normal.     ED Results / Procedures / Treatments   Labs (all labs ordered are listed, but only abnormal results are displayed) Labs Reviewed  RESP PANEL BY RT-PCR (RSV, FLU A&B, COVID)  RVPGX2 - Abnormal; Notable for the following components:      Result Value   Resp Syncytial Virus by PCR POSITIVE (*)    All other components within normal limits  CBC WITH DIFFERENTIAL/PLATELET - Abnormal; Notable for the following components:   Hemoglobin 11.1 (*)    MCV 73.0 (*)    MCH 21.9 (*)    RDW 16.2 (*)    Platelets 470 (*)    All other components within normal limits  COMPREHENSIVE METABOLIC PANEL - Abnormal;  Notable for the following components:   CO2 21 (*)    Glucose, Bld 154 (*)    BUN <5 (*)    Calcium 8.8 (*)    All other components within normal limits  URINALYSIS, ROUTINE W REFLEX MICROSCOPIC - Abnormal; Notable for the following components:   APPearance CLOUDY (*)    Hgb urine dipstick SMALL (*)    Ketones, ur 5 (*)    Protein, ur 100 (*)    Leukocytes,Ua LARGE (*)    WBC, UA >50 (*)    Bacteria, UA RARE (*)    Non Squamous Epithelial 0-5 (*)    All other components within normal limits  HCG, QUANTITATIVE, PREGNANCY    EKG None  Radiology DG Chest 2 View  Result Date: 12/15/2022 CLINICAL DATA:  Cough for 2 weeks. EXAM: CHEST - 2 VIEW COMPARISON:  10/14/2022 and prior radiographs FINDINGS: The cardiomediastinal silhouette is unremarkable. Bilateral LOWER lung subsegmental atelectasis/scarring again noted. There is no evidence of focal airspace disease, pulmonary edema, suspicious pulmonary nodule/mass, pleural effusion, or pneumothorax. No acute bony abnormalities are identified. IMPRESSION: 1. No acute cardiopulmonary disease. 2. Bilateral LOWER lung subsegmental atelectasis/scarring again noted. Electronically Signed   By: Margarette Canada M.D.   On: 12/15/2022 15:48    Procedures Procedures    Medications Ordered in ED Medications  ondansetron (ZOFRAN-ODT) disintegrating tablet 4 mg (has no administration in time range)    ED Course/ Medical Decision Making/ A&P                           Medical Decision Making Amount and/or Complexity of Data Reviewed Radiology: ordered.    43 year old female with medical history significant for DM 2 who presents to the emergency department with a cough.  The patient history was provided with the aid of a Spanish language telemetry interpreter.  She states that she has had roughly 2 weeks of a cough and nasal congestion.  She been taking Tessalon Perles for cough but states that her symptoms have persisted.  She developed vomiting  yesterday, denies any abdominal pain denies any active nausea, denies any genitourinary symptoms.  She denies any fevers or chills.  She endorses a persistent productive cough.  She also states that she developed left ear pain yesterday.  She is tolerating oral intake.  She denies any chest pain.  He was also concerned that she had missed her period and was worried she could be pregnant.  Her last menstrual period was on 12/12.  On arrival, the patient  was vitally stable, afebrile, not tachycardic or tachypneic.  Physical exam generally unremarkable with TMs without evidence of otitis media, lungs clear to auscultation all fields.  Laboratory evaluation significant for RSV PCR positive, CBC without a leukocytosis, mild anemia noted to 11.1, urinalysis with evidence of UTI however the patient has no genitourinary symptoms at this time, will not treat asymptomatic bacteriuria.  Patient CMP generally unremarkable, mild non-anion gap acidosis with a bicarbonate of 21, mild hyperglycemia 154, otherwise no electrolyte abnormality, hCG normal.  CXR: IMPRESSION:  1. No acute cardiopulmonary disease.  2. Bilateral LOWER lung subsegmental atelectasis/scarring again  noted.    Pt overall well appearing, vitally stable. Advised symptomatic management at home, return precautions provided.   Final Clinical Impression(s) / ED Diagnoses Final diagnoses:  RSV (respiratory syncytial virus infection)    Rx / DC Orders ED Discharge Orders          Ordered    ondansetron (ZOFRAN-ODT) 4 MG disintegrating tablet  Every 8 hours PRN        12/15/22 1553              Regan Lemming, MD 12/15/22 1556

## 2022-12-15 NOTE — ED Provider Triage Note (Signed)
Emergency Medicine Provider Triage Evaluation Note  [name] , a 42 y.o. female  was evaluated in triage.  Pt complains of 2 weeks of cough and runny nose.  Seen on 1223 at urgent care and states she told she likely had a virus and was [employer] for cough but these have not helped at all.  She states that yesterday she started vomiting has had several episodes of vomiting.  He also began having left ear pain today  Review of Systems  Positive: Left ear pain, nausea and vomiting, cough and runny nose Negative: For chest pain shortness of breath  Physical Exam  BP 113/77   Pulse 77   Temp 98.2 F (36.8 C) (Oral)   Resp 18   SpO2 98%  Gen:   Awake, no distress   Resp:  Normal effort  MSK:   Moves extremities without difficulty  Other:    Medical Decision Making  Medically screening exam initiated at 2:00 PM.  Appropriate orders placed.  [name] was informed that the remainder of the evaluation will be completed by another provider, this initial triage assessment does not replace that evaluation, and the importance of remaining in the ED until their evaluation is complete.     [name], [place] 12/15/22 1402

## 2022-12-15 NOTE — ED Triage Notes (Signed)
Pt c/o cough x2 weeks. Pt c/o left ear pain and nausea since yesterday. Pt states she was supposed to start her period 12/12 and still hasn't started, requesting a pregnancy test.

## 2023-01-28 ENCOUNTER — Other Ambulatory Visit: Payer: Self-pay | Admitting: Obstetrics & Gynecology

## 2023-01-28 DIAGNOSIS — Z1231 Encounter for screening mammogram for malignant neoplasm of breast: Secondary | ICD-10-CM

## 2023-02-05 ENCOUNTER — Ambulatory Visit
Admission: RE | Admit: 2023-02-05 | Discharge: 2023-02-05 | Disposition: A | Discharge status: Home / Self Care | Payer: No Typology Code available for payment source | Source: Ambulatory Visit | Attending: Obstetrics & Gynecology | Admitting: Obstetrics & Gynecology

## 2023-02-05 DIAGNOSIS — Z1231 Encounter for screening mammogram for malignant neoplasm of breast: Secondary | ICD-10-CM

## 2023-11-24 ENCOUNTER — Emergency Department (HOSPITAL_COMMUNITY)
Admission: EM | Admit: 2023-11-24 | Discharge: 2023-11-25 | Discharge status: Against Medical Advice | Payer: No Typology Code available for payment source | Attending: Emergency Medicine | Admitting: Emergency Medicine

## 2023-11-24 ENCOUNTER — Encounter (HOSPITAL_COMMUNITY): Payer: Self-pay

## 2023-11-24 DIAGNOSIS — Z5321 Procedure and treatment not carried out due to patient leaving prior to being seen by health care provider: Secondary | ICD-10-CM | POA: Insufficient documentation

## 2023-11-24 DIAGNOSIS — K0889 Other specified disorders of teeth and supporting structures: Secondary | ICD-10-CM | POA: Insufficient documentation

## 2023-11-24 MED ORDER — HYDROCODONE-ACETAMINOPHEN 5-325 MG PO TABS
1.0000 | ORAL_TABLET | Freq: Once | ORAL | Status: AC
  Administered 2023-11-24: 1 via ORAL
  Filled 2023-11-24: qty 1

## 2023-11-24 NOTE — ED Provider Triage Note (Signed)
Emergency Medicine Provider Triage Evaluation Note  [name] , a 42 y.o. female  was evaluated in triage.  Pt complains of left jaw/ear pain for one day. NO fevers. Able to eat. Denies dental problems, facial or neck swelling. Tried tylenol to no relief. Unsure of cracked teeth. Has not seen a dentist in some years. Denies any recent swimming. Unsure if pain from the ear to the jaw or vice versa. Unsure of what makes pain worse.  Review of Systems  Positive:  Negative:   Physical Exam  There were no vitals taken for this visit. Gen:   Awake, no distress   Resp:  Normal effort  MSK:   Moves extremities without difficulty  Other:  Tragal tenderness, lower left teeth tenderness but no loose teeth or gingival changes, no face or neck swelling, tender on parotid gland  Medical Decision Making  Medically screening exam initiated at 8:39 PM.  Appropriate orders placed.  [name] was informed that the remainder of the evaluation will be completed by another provider, this initial triage assessment does not replace that evaluation, and the importance of remaining in the ED until their evaluation is complete.  Workup started, pt stable   [name], [place] 11/24/23 2043

## 2023-11-24 NOTE — ED Triage Notes (Signed)
Pt coming in for left sided dental pain that extends into her left ear. Started lastnight. Pt is holding jaw and exhibits some grimaces as if she is pain. She is otherwise stable with no other complaints.

## 2023-11-25 NOTE — ED Notes (Signed)
Pt staed they was leaving and was seen leaving the ED

## 2024-02-22 ENCOUNTER — Emergency Department (HOSPITAL_COMMUNITY)
Admission: EM | Admit: 2024-02-22 | Discharge: 2024-02-23 | Disposition: A | Discharge status: Home / Self Care | Attending: Emergency Medicine | Admitting: Emergency Medicine

## 2024-02-22 ENCOUNTER — Other Ambulatory Visit: Payer: Self-pay

## 2024-02-22 ENCOUNTER — Encounter (HOSPITAL_COMMUNITY): Payer: Self-pay | Admitting: Emergency Medicine

## 2024-02-22 DIAGNOSIS — R519 Headache, unspecified: Secondary | ICD-10-CM | POA: Insufficient documentation

## 2024-02-22 DIAGNOSIS — R42 Dizziness and giddiness: Secondary | ICD-10-CM | POA: Insufficient documentation

## 2024-02-22 DIAGNOSIS — N390 Urinary tract infection, site not specified: Secondary | ICD-10-CM | POA: Insufficient documentation

## 2024-02-22 LAB — URINALYSIS, ROUTINE W REFLEX MICROSCOPIC
Bilirubin Urine: NEGATIVE
Glucose, UA: NEGATIVE mg/dL
Hgb urine dipstick: NEGATIVE
Ketones, ur: NEGATIVE mg/dL
Nitrite: NEGATIVE
Protein, ur: NEGATIVE mg/dL
Specific Gravity, Urine: 1.016 (ref 1.005–1.030)
WBC, UA: >50 WBC/hpf (ref 0–5)
pH: 7 (ref 5.0–8.0)

## 2024-02-22 LAB — BASIC METABOLIC PANEL
Anion gap: 13 (ref 5–15)
BUN: 11 mg/dL (ref 6–20)
CO2: 23 mmol/L (ref 22–32)
Calcium: 8.6 mg/dL — ABNORMAL LOW (ref 8.9–10.3)
Chloride: 101 mmol/L (ref 98–111)
Creatinine, Ser: 0.72 mg/dL (ref 0.44–1.00)
GFR, Estimated: >60 mL/min (ref >60)
Glucose, Bld: 198 mg/dL — ABNORMAL HIGH (ref 70–99)
Potassium: 4 mmol/L (ref 3.5–5.1)
Sodium: 137 mmol/L (ref 135–145)

## 2024-02-22 LAB — CBC
HCT: 32.7 % — ABNORMAL LOW (ref 36.0–46.0)
Hemoglobin: 9.6 g/dL — ABNORMAL LOW (ref 12.0–15.0)
MCH: 21.3 pg — ABNORMAL LOW (ref 26.0–34.0)
MCHC: 29.4 g/dL — ABNORMAL LOW (ref 30.0–36.0)
MCV: 72.7 fL — ABNORMAL LOW (ref 80.0–100.0)
Platelets: 495 10*3/uL — ABNORMAL HIGH (ref 150–400)
RBC: 4.5 MIL/uL (ref 3.87–5.11)
RDW: 17.4 % — ABNORMAL HIGH (ref 11.5–15.5)
WBC: 10.8 10*3/uL — ABNORMAL HIGH (ref 4.0–10.5)
nRBC: 0 % (ref 0.0–0.2)

## 2024-02-22 LAB — HCG, SERUM, QUALITATIVE: Preg, Serum: NEGATIVE

## 2024-02-22 LAB — CBG MONITORING, ED: Glucose-Capillary: 176 mg/dL — ABNORMAL HIGH (ref 70–99)

## 2024-02-22 NOTE — ED Triage Notes (Signed)
 Pt c/o chest pain radiating down right arm that started one month ago, dizziness and headaches that started two days ago. Pt reports she had a syncopal episode two days ago, LOC for about 5 minutes according to partner. Denies hitting head when she fell. No injuries. Also reporting vomiting. Reports dizziness occurs with positional changes.

## 2024-02-23 MED ORDER — KETOROLAC TROMETHAMINE 15 MG/ML IJ SOLN
15.0000 mg | Freq: Once | INTRAMUSCULAR | Status: AC
  Administered 2024-02-23: 15 mg via INTRAMUSCULAR
  Filled 2024-02-23: qty 1

## 2024-02-23 MED ORDER — CEPHALEXIN 500 MG PO CAPS: 500.0000 mg | ORAL_CAPSULE | Freq: Four times a day (QID) | ORAL | 0 refills | Status: DC

## 2024-02-23 NOTE — ED Provider Notes (Signed)
  EMERGENCY DEPARTMENT AT [facility] Provider Note   CSN: [id number] Arrival date & time: 02/22/24  2211     History  Chief Complaint  Patient presents with   Dizziness   Loss of Consciousness    [name] is a 43 y.o. female.  43 year old female with prior medical history as detailed below presents for evaluation.  Patient complains of multiple complaints.  She reports that over the weekend she began to feel ill with mild intermittent dizziness, body aches, headache.  She denies fever at home.  She had a brief syncopal event per her report 2 days ago.  She denies recurrent syncope.  She denies associated chest pain or shortness of breath.  She denies nausea or vomiting.  She does complain of some suprapubic abdominal discomfort and associated mild dysuria.  She reports prior history of UTI.  She denies recent use of antibiotics for treatment of UTI.  Language interpreter utilized for interview.  The history is provided by the patient and medical records. A language interpreter was used.       Home Medications Prior to Admission medications   Medication Sig Start Date End Date Taking? Authorizing Provider  albuterol (VENTOLIN HFA) 108 (90 Base) MCG/ACT inhaler Inhale 1-2 puffs into the lungs every 6 (six) hours as needed for wheezing or shortness of breath. 10/14/22   Gustavus Bryant, FNP  aspirin 325 MG EC tablet Take 325 mg by mouth as needed (for chest pain).    [provider]  benzonatate (TESSALON) 100 MG capsule Take 1 capsule (100 mg total) by mouth every 8 (eight) hours as needed for cough. 12/06/22   Tomi Bamberger, PA-C  naproxen (NAPROSYN) 500 MG tablet Take 1 tablet (500 mg total) by mouth 2 (two) times daily. 01/22/22   Geoffery Lyons, MD  ondansetron (ZOFRAN-ODT) 4 MG disintegrating tablet Take 1 tablet (4 mg total) by mouth every 8 (eight) hours as needed for nausea or vomiting. 12/15/22   Ernie Avena, MD      Allergies     Patient has no known allergies.    Review of Systems   Review of Systems  All other systems reviewed and are negative.   Physical Exam Updated Vital Signs BP 107/76 (BP Location: Right Arm)   Pulse 88   Temp 98.9 F (37.2 C)   Resp 18   Ht 5\' 1"  (1.549 m)   Wt 59 kg   SpO2 98%   BMI 24.56 kg/m  Physical Exam Vitals and nursing note reviewed.  Constitutional:      General: She is not in acute distress.    Appearance: Normal appearance. She is well-developed.  HENT:     Head: Normocephalic and atraumatic.  Eyes:     Conjunctiva/sclera: Conjunctivae normal.     Pupils: Pupils are equal, round, and reactive to light.  Cardiovascular:     Rate and Rhythm: Normal rate and regular rhythm.     Heart sounds: Normal heart sounds.  Pulmonary:     Effort: Pulmonary effort is normal. No respiratory distress.     Breath sounds: Normal breath sounds.  Abdominal:     General: There is no distension.     Palpations: Abdomen is soft.     Tenderness: There is abdominal tenderness.     Comments: Mild suprapubic tenderness with palpation.  No significant other abdominal tenderness.  No rebound or guarding.  Musculoskeletal:        General: No deformity.  Normal range of motion.     Cervical back: Normal range of motion and neck supple.  Skin:    General: Skin is warm and dry.  Neurological:     General: No focal deficit present.     Mental Status: She is alert and oriented to person, place, and time.     ED Results / Procedures / Treatments   Labs (all labs ordered are listed, but only abnormal results are displayed) Labs Reviewed  BASIC METABOLIC PANEL - Abnormal; Notable for the following components:      Result Value   Glucose, Bld 198 (*)    Calcium 8.6 (*)    All other components within normal limits  CBC - Abnormal; Notable for the following components:   WBC 10.8 (*)    Hemoglobin 9.6 (*)    HCT 32.7 (*)    MCV 72.7 (*)    MCH 21.3 (*)    MCHC 29.4 (*)    RDW  17.4 (*)    Platelets 495 (*)    All other components within normal limits  URINALYSIS, ROUTINE W REFLEX MICROSCOPIC - Abnormal; Notable for the following components:   APPearance HAZY (*)    Leukocytes,Ua LARGE (*)    Bacteria, UA RARE (*)    All other components within normal limits  CBG MONITORING, ED - Abnormal; Notable for the following components:   Glucose-Capillary 176 (*)    All other components within normal limits  HCG, SERUM, QUALITATIVE    EKG EKG Interpretation Date/Time:  Monday February 22 2024 22:29:42 EDT Ventricular Rate:  95 PR Interval:  124 QRS Duration:  72 QT Interval:  334 QTC Calculation: 419 R Axis:   43  Text Interpretation: Normal sinus rhythm Normal ECG When compared with ECG of 21-Jan-2022 19:29, PREVIOUS ECG IS PRESENT Confirmed by Kristine Royal [phone]) on 02/23/2024 7:52:50 AM  Radiology No results found.  Procedures Procedures    Medications Ordered in ED Medications  ketorolac (TORADOL) 15 MG/ML injection 15 mg (has no administration in time range)    ED Course/ Medical Decision Making/ A&P                                 Medical Decision Making Amount and/or Complexity of Data Reviewed Labs: ordered.  Risk Prescription drug management.    Medical Screen Complete  This patient presented to the ED with complaint of myalgias, suprapubic abdominal pain.  This complaint involves an extensive number of treatment options. The initial differential diagnosis includes, but is not limited to, viral versus bacterial infection, UTI, metabolic abnormality, etc.  This presentation is: Acute, Self-Limited, Previously Undiagnosed, Uncertain Prognosis, Complicated, Systemic Symptoms, and Threat to Life/Bodily Function  Patient is presenting with multiple complaints.  Chief amongst them is myalgias, fatigue, suprapubic tenderness, dysuria.  Screening labs obtained are suggestive of UTI.  Other screening labs are without significant acute  abnormality.  Patient reassured by evaluation results.  She understands need for close outpatient follow-up.  Strict return precautions given and understood.  Co morbidities that complicated the patient's evaluation  See HPI   Additional history obtained: External records from outside sources obtained and reviewed including prior ED visits and prior Inpatient records.   Problem List / ED Course:  UTI   Reevaluation:  After the interventions noted above, I reevaluated the patient and found that they have: improved   Disposition:  After consideration of the diagnostic results  and the patients response to treatment, I feel that the patent would benefit from close outpatient follow-up.          Final Clinical Impression(s) / ED Diagnoses Final diagnoses:  Urinary tract infection without hematuria, site unspecified    Rx / DC Orders ED Discharge Orders          Ordered    cephALEXin (KEFLEX) 500 MG capsule  4 times daily        02/23/24 0819              Wynetta Fines, MD 02/23/24 [phone]

## 2024-02-23 NOTE — ED Notes (Signed)
 Pt understood d/c instructions and when to return to the ED. Pt understood home medication instructions. Pt left by herself to go home

## 2024-02-23 NOTE — Discharge Instructions (Signed)
 Return for any problem.  ?

## 2024-08-18 ENCOUNTER — Encounter (HOSPITAL_COMMUNITY): Payer: Self-pay

## 2024-08-18 ENCOUNTER — Inpatient Hospital Stay (HOSPITAL_COMMUNITY)
Admission: EM | Admit: 2024-08-18 | Discharge: 2024-08-22 | DRG: 690 | Disposition: A | Discharge status: Home / Self Care | Payer: Self-pay | Attending: Internal Medicine | Admitting: Internal Medicine

## 2024-08-18 ENCOUNTER — Other Ambulatory Visit: Payer: Self-pay

## 2024-08-18 DIAGNOSIS — N12 Tubulo-interstitial nephritis, not specified as acute or chronic: Principal | ICD-10-CM

## 2024-08-18 DIAGNOSIS — Z7982 Long term (current) use of aspirin: Secondary | ICD-10-CM

## 2024-08-18 DIAGNOSIS — Z8616 Personal history of COVID-19: Secondary | ICD-10-CM

## 2024-08-18 DIAGNOSIS — N10 Acute tubulo-interstitial nephritis: Principal | ICD-10-CM | POA: Diagnosis present

## 2024-08-18 DIAGNOSIS — D509 Iron deficiency anemia, unspecified: Secondary | ICD-10-CM | POA: Diagnosis present

## 2024-08-18 DIAGNOSIS — Z79899 Other long term (current) drug therapy: Secondary | ICD-10-CM

## 2024-08-18 DIAGNOSIS — N912 Amenorrhea, unspecified: Secondary | ICD-10-CM

## 2024-08-18 DIAGNOSIS — R1084 Generalized abdominal pain: Secondary | ICD-10-CM

## 2024-08-18 DIAGNOSIS — N200 Calculus of kidney: Secondary | ICD-10-CM | POA: Diagnosis present

## 2024-08-18 DIAGNOSIS — N119 Chronic tubulo-interstitial nephritis, unspecified: Secondary | ICD-10-CM | POA: Diagnosis present

## 2024-08-18 DIAGNOSIS — Z7984 Long term (current) use of oral hypoglycemic drugs: Secondary | ICD-10-CM

## 2024-08-18 DIAGNOSIS — B964 Proteus (mirabilis) (morganii) as the cause of diseases classified elsewhere: Secondary | ICD-10-CM | POA: Diagnosis present

## 2024-08-18 DIAGNOSIS — E119 Type 2 diabetes mellitus without complications: Secondary | ICD-10-CM | POA: Diagnosis present

## 2024-08-18 DIAGNOSIS — Z905 Acquired absence of kidney: Secondary | ICD-10-CM

## 2024-08-18 DIAGNOSIS — N390 Urinary tract infection, site not specified: Secondary | ICD-10-CM

## 2024-08-18 DIAGNOSIS — N920 Excessive and frequent menstruation with regular cycle: Secondary | ICD-10-CM | POA: Diagnosis present

## 2024-08-18 DIAGNOSIS — E876 Hypokalemia: Secondary | ICD-10-CM | POA: Diagnosis present

## 2024-08-18 LAB — COMPREHENSIVE METABOLIC PANEL WITH GFR
ALT: 8 U/L (ref 0–44)
AST: 14 U/L — ABNORMAL LOW (ref 15–41)
Albumin: 3.5 g/dL (ref 3.5–5.0)
Alkaline Phosphatase: 80 U/L (ref 38–126)
Anion gap: 12 (ref 5–15)
BUN: 8 mg/dL (ref 6–20)
CO2: 20 mmol/L — ABNORMAL LOW (ref 22–32)
Calcium: 9 mg/dL (ref 8.9–10.3)
Chloride: 106 mmol/L (ref 98–111)
Creatinine, Ser: 0.86 mg/dL (ref 0.44–1.00)
GFR, Estimated: >60 mL/min (ref >60)
Glucose, Bld: 141 mg/dL — ABNORMAL HIGH (ref 70–99)
Potassium: 3.4 mmol/L — ABNORMAL LOW (ref 3.5–5.1)
Sodium: 138 mmol/L (ref 135–145)
Total Bilirubin: 0.3 mg/dL (ref 0.0–1.2)
Total Protein: 8.1 g/dL (ref 6.5–8.1)

## 2024-08-18 LAB — URINALYSIS, ROUTINE W REFLEX MICROSCOPIC
Bilirubin Urine: NEGATIVE
Glucose, UA: NEGATIVE mg/dL
Ketones, ur: NEGATIVE mg/dL
Nitrite: NEGATIVE
Protein, ur: NEGATIVE mg/dL
Specific Gravity, Urine: 1.005 (ref 1.005–1.030)
WBC, UA: >50 WBC/hpf (ref 0–5)
pH: 7 (ref 5.0–8.0)

## 2024-08-18 LAB — CBC WITH DIFFERENTIAL/PLATELET
Abs Immature Granulocytes: 0.03 K/uL (ref 0.00–0.07)
Basophils Absolute: 0.1 K/uL (ref 0.0–0.1)
Basophils Relative: 1 %
Eosinophils Absolute: 0.1 K/uL (ref 0.0–0.5)
Eosinophils Relative: 1 %
HCT: 38 % (ref 36.0–46.0)
Hemoglobin: 11 g/dL — ABNORMAL LOW (ref 12.0–15.0)
Immature Granulocytes: 0 %
Lymphocytes Relative: 35 %
Lymphs Abs: 3.1 K/uL (ref 0.7–4.0)
MCH: 21.8 pg — ABNORMAL LOW (ref 26.0–34.0)
MCHC: 28.9 g/dL — ABNORMAL LOW (ref 30.0–36.0)
MCV: 75.4 fL — ABNORMAL LOW (ref 80.0–100.0)
Monocytes Absolute: 0.6 K/uL (ref 0.1–1.0)
Monocytes Relative: 6 %
Neutro Abs: 5 K/uL (ref 1.7–7.7)
Neutrophils Relative %: 57 %
Platelets: 477 K/uL — ABNORMAL HIGH (ref 150–400)
RBC: 5.04 MIL/uL (ref 3.87–5.11)
RDW: 16.8 % — ABNORMAL HIGH (ref 11.5–15.5)
WBC: 8.9 K/uL (ref 4.0–10.5)
nRBC: 0 % (ref 0.0–0.2)

## 2024-08-18 LAB — I-STAT CHEM 8, ED
BUN: 9 mg/dL (ref 6–20)
Calcium, Ion: 1.05 mmol/L — ABNORMAL LOW (ref 1.15–1.40)
Chloride: 107 mmol/L (ref 98–111)
Creatinine, Ser: 0.8 mg/dL (ref 0.44–1.00)
Glucose, Bld: 147 mg/dL — ABNORMAL HIGH (ref 70–99)
HCT: 38 % (ref 36.0–46.0)
Hemoglobin: 12.9 g/dL (ref 12.0–15.0)
Potassium: 3.4 mmol/L — ABNORMAL LOW (ref 3.5–5.1)
Sodium: 139 mmol/L (ref 135–145)
TCO2: 18 mmol/L — ABNORMAL LOW (ref 22–32)

## 2024-08-18 LAB — LIPASE, BLOOD: Lipase: 38 U/L (ref 11–51)

## 2024-08-18 LAB — POC URINE PREG, ED: Preg Test, Ur: NEGATIVE

## 2024-08-18 LAB — MAGNESIUM: Magnesium: 1.8 mg/dL (ref 1.7–2.4)

## 2024-08-18 MED ORDER — MORPHINE SULFATE (PF) 2 MG/ML IV SOLN
2.0000 mg | Freq: Once | INTRAVENOUS | Status: AC
  Administered 2024-08-18: 2 mg via INTRAVENOUS
  Filled 2024-08-18: qty 1

## 2024-08-18 MED ORDER — SODIUM CHLORIDE 0.9 % IV SOLN
1.0000 g | Freq: Once | INTRAVENOUS | Status: AC
  Administered 2024-08-18: 1 g via INTRAVENOUS
  Filled 2024-08-18: qty 10

## 2024-08-18 NOTE — ED Provider Notes (Signed)
 [place] [facility]GENC[facility] DEPARTMENT AT [facility] Provider Note   CSN: [id number] Arrival date & time: 08/18/24  1351     Patient presents with: Back Pain   [name] is a 43 y.o. female with past medical history of T2DM presents to Emergency Department for evaluation of abdominal pain, lower back pain, numbness. Reports that she started feeling generalized abdominal cramping that started 2 days ago that radiates into her lower back.  Last BM was yesterday normal.  Reports that she has increased frequency at times but no dysuria.  Denies vaginal symptoms, history of stones, known sick contacts, fevers  Reports that she was supposed to have menses on 07/30/2024 but is currently 20 days late.    Back Pain      Prior to Admission medications   Medication Sig Start Date End Date Taking? Authorizing Provider  albuterol  (VENTOLIN  HFA) 108 (90 Base) MCG/ACT inhaler Inhale 1-2 puffs into the lungs every 6 (six) hours as needed for wheezing or shortness of breath. 10/14/22   Hazen Darryle BRAVO, FNP  aspirin 325 MG EC tablet Take 325 mg by mouth as needed (for chest pain).    [provider]  benzonatate  (TESSALON ) 100 MG capsule Take 1 capsule (100 mg total) by mouth every 8 (eight) hours as needed for cough. 12/06/22   Billy Asberry FALCON, PA-C  cephALEXin  (KEFLEX ) 500 MG capsule Take 1 capsule (500 mg total) by mouth 4 (four) times daily. 02/23/24   Laurice Maude BROCKS, MD  naproxen  (NAPROSYN ) 500 MG tablet Take 1 tablet (500 mg total) by mouth 2 (two) times daily. 01/22/22   Geroldine Berg, MD  ondansetron  (ZOFRAN -ODT) 4 MG disintegrating tablet Take 1 tablet (4 mg total) by mouth every 8 (eight) hours as needed for nausea or vomiting. 12/15/22   Jerrol Agent, MD    Allergies: Patient has no known allergies.    Review of Systems  Musculoskeletal:  Positive for back pain.    Updated Vital Signs BP (!) 104/56 (BP Location: Left Arm)   Pulse 73   Temp 98.2 F (36.8 C)    Resp 14   SpO2 100%   Physical Exam Vitals and nursing note reviewed.  Constitutional:      General: She is not in acute distress.    Appearance: Normal appearance.  HENT:     Head: Normocephalic and atraumatic.  Eyes:     Conjunctiva/sclera: Conjunctivae normal.  Cardiovascular:     Rate and Rhythm: Normal rate.  Pulmonary:     Effort: Pulmonary effort is normal. No respiratory distress.     Breath sounds: Normal breath sounds.  Abdominal:     General: Bowel sounds are normal. There is no distension.     Palpations: Abdomen is soft.     Tenderness: There is generalized abdominal tenderness. There is no right CVA tenderness, left CVA tenderness, guarding or rebound.     Comments: Nonsurgical abdomen with no peritoneal signs  Skin:    Coloration: Skin is not jaundiced or pale.  Neurological:     Mental Status: She is alert and oriented to person, place, and time. Mental status is at baseline.     (all labs ordered are listed, but only abnormal results are displayed) Labs Reviewed  URINALYSIS, ROUTINE W REFLEX MICROSCOPIC - Abnormal; Notable for the following components:      Result Value   Color, Urine STRAW (*)    Hgb urine dipstick SMALL (*)    Leukocytes,Ua LARGE (*)  Bacteria, UA FEW (*)    All other components within normal limits  CBC WITH DIFFERENTIAL/PLATELET - Abnormal; Notable for the following components:   Hemoglobin 11.0 (*)    MCV 75.4 (*)    MCH 21.8 (*)    MCHC 28.9 (*)    RDW 16.8 (*)    Platelets 477 (*)    All other components within normal limits  COMPREHENSIVE METABOLIC PANEL WITH GFR - Abnormal; Notable for the following components:   Potassium 3.4 (*)    CO2 20 (*)    Glucose, Bld 141 (*)    AST 14 (*)    All other components within normal limits  I-STAT CHEM 8, ED - Abnormal; Notable for the following components:   Potassium 3.4 (*)    Glucose, Bld 147 (*)    Calcium , Ion 1.05 (*)    TCO2 18 (*)    All other components within normal  limits  URINE CULTURE  MAGNESIUM   LIPASE, BLOOD  POC URINE PREG, ED    EKG: None  Radiology: No results found.   Medications Ordered in the ED  cefTRIAXone  (ROCEPHIN ) 1 g in sodium chloride  0.9 % 100 mL IVPB (has no administration in time range)  morphine  (PF) 2 MG/ML injection 2 mg (has no administration in time range)                                    Medical Decision Making Amount and/or Complexity of Data Reviewed Labs: ordered. Radiology: ordered.  Risk Prescription drug management.   Patient presents to the ED for concern of abdominal pain, nausea, back pain, this involves an extensive number of treatment options, and is a complaint that carries with it a high risk of complications and morbidity.  The differential diagnosis includes UTI, pyelonephritis, gastroenteritis, pancreatitis, appendicitis, kidney stone   Co morbidities that complicate the patient evaluation  See HPI   Additional history obtained:  Additional history obtained from Nursing   External records from outside source obtained and reviewed including triage note   Lab Tests:  I Ordered, and personally interpreted labs.  The pertinent results include:   Potassium 3.4 CBG 141 No leukocytosis Hgb 11 PLT 477 UA notable for small Hgb, large leuks, WBC, few bacteria   Imaging Studies ordered:  I ordered imaging studies including CT abdomen pelvis Pending at signout    Medicines ordered and prescription drug management:  I ordered medication including Rocephin , morphine  for UTI, pain Reevaluation of the patient after these medicines showed that the patient improved I have reviewed the patients home medicines and have made adjustments as needed    Problem List / ED Course:  UTI Vital signs WNL with no tachycardia, fever, nor hypotension UA with small Hgb, large leuks, WBC, few bacteria with no contamination Plan to provide Rocephin  in ED for suspected UTI Urine culture  pending Plan to provide Keflex  as she had it on 02/22/2024 without complications  Abd pain Generalized with worsening tenderness in lower quadrants bilaterally and radiates into back bilaterally Had nausea for 1 week during her suppose it menses week that she did not get her menses.  No vomiting or diarrhea.  No known sick contacts. IV team consulted for IV for IV Rocephin , IV analgesia, CT imaging as patient had multiple attempts by multiple nurses for IV access.  This caused a delay in completion of ED workup, imaging  Amenorrhea No localized  tenderness nor vomiting.  Pain is described as all over worse in the bilateral lower quadrants that radiates into bilateral back.  Low suspicion for torsion. Denies vaginal symptoms, STD concerns, vaginal bleeding Will have her follow-up with OB/GYN.   Reevaluation:  After the interventions noted above, I reevaluated the patient and found that they have :improved   Social Determinants of Health:  No PCP on file-provider recommendation   Dispostion:  Dispo pending CT imaging, completion of IV medications.  Thus far lab work is reassuring.  No leukocytosis.  No significant electrolyte abnormality.  hCG negative.  UA appears infected.  Plan at this time is DC as long as there is no acute surgical abnormality on CT scan requiring admission.  Signed out to [employer], [place] pending CT, medication administration, reassessment  Final diagnoses:  Amenorrhea  Generalized abdominal pain  Urinary tract infection with hematuria, site unspecified    ED Discharge Orders     None        Minnie Tinnie BRAVO, PA 08/18/24 2339    Elnor Savant A, DO 08/25/24 1600

## 2024-08-18 NOTE — ED Triage Notes (Signed)
 Patient states she is 20 days late and her pregnancy test has been negative

## 2024-08-18 NOTE — ED Triage Notes (Signed)
 Pt here from home with c/o back pain and lower abe pain times 3 weeks

## 2024-08-18 NOTE — ED Triage Notes (Signed)
 Patient here for back pain and lower abdominal pain for the past couple weeks. Denies injury.

## 2024-08-18 NOTE — ED Notes (Signed)
 PT IS SPANISH SPEAKING BUT DOES SPEAK ENGLISH FAIRLY WELL. INTERPRETER AT [facility]

## 2024-08-18 NOTE — ED Provider Triage Note (Signed)
 Emergency Medicine Provider Triage Evaluation Note  [name] , a 43 y.o. female  was evaluated in triage.  Pt complains of lower abdominal pain, right flank pain.  States she is late on her cycle but has had a recent negative pregnancy test.  Denies hematuria, dysuria  Review of Systems  Positive: As above Negative: As above  Physical Exam  BP (!) 129/54 (BP Location: Left Arm)   Pulse 89   Temp 98.1 F (36.7 C)   Resp 16   SpO2 100%  Gen:   Awake, no distress   Resp:  Normal effort  MSK:   Moves extremities without difficulty  Other:    Medical Decision Making  Medically screening exam initiated at 3:31 PM.  Appropriate orders placed.  [name] was informed that the remainder of the evaluation will be completed by another provider, this initial triage assessment does not replace that evaluation, and the importance of remaining in the ED until their evaluation is complete.     Hildegard Loge, PA-C 08/18/24 1531

## 2024-08-19 ENCOUNTER — Emergency Department (HOSPITAL_COMMUNITY)

## 2024-08-19 DIAGNOSIS — E876 Hypokalemia: Secondary | ICD-10-CM

## 2024-08-19 DIAGNOSIS — D509 Iron deficiency anemia, unspecified: Secondary | ICD-10-CM

## 2024-08-19 DIAGNOSIS — N10 Acute tubulo-interstitial nephritis: Secondary | ICD-10-CM

## 2024-08-19 LAB — URINE CULTURE: Culture: <10000 — AB

## 2024-08-19 MED ORDER — ACETAMINOPHEN 650 MG RE SUPP
650.0000 mg | Freq: Four times a day (QID) | RECTAL | Status: DC | PRN
Start: 2024-08-19 — End: 2024-08-20

## 2024-08-19 MED ORDER — ONDANSETRON HCL 4 MG/2ML IJ SOLN
4.0000 mg | Freq: Four times a day (QID) | INTRAMUSCULAR | Status: DC | PRN
Start: 2024-08-19 — End: 2024-08-22
  Administered 2024-08-20 – 2024-08-21 (×2): 4 mg via INTRAVENOUS
  Filled 2024-08-19 (×2): qty 2

## 2024-08-19 MED ORDER — SODIUM CHLORIDE 0.9 % IV SOLN
2.0000 g | INTRAVENOUS | Status: DC
  Administered 2024-08-19 – 2024-08-21 (×3): 2 g via INTRAVENOUS
  Filled 2024-08-19 (×3): qty 20

## 2024-08-19 MED ORDER — MAGNESIUM SULFATE 2 GM/50ML IV SOLN
2.0000 g | Freq: Once | INTRAVENOUS | Status: AC
  Administered 2024-08-19: 2 g via INTRAVENOUS
  Filled 2024-08-19: qty 50

## 2024-08-19 MED ORDER — ONDANSETRON HCL 4 MG PO TABS: 4.0000 mg | ORAL_TABLET | Freq: Four times a day (QID) | ORAL | Status: DC | PRN

## 2024-08-19 MED ORDER — LACTATED RINGERS IV BOLUS
1000.0000 mL | Freq: Once | INTRAVENOUS | Status: AC
  Administered 2024-08-19: 1000 mL via INTRAVENOUS

## 2024-08-19 MED ORDER — IOHEXOL 350 MG/ML SOLN
75.0000 mL | Freq: Once | INTRAVENOUS | Status: AC | PRN
  Administered 2024-08-19: 75 mL via INTRAVENOUS

## 2024-08-19 MED ORDER — OXYCODONE-ACETAMINOPHEN 5-325 MG PO TABS
1.0000 | ORAL_TABLET | Freq: Four times a day (QID) | ORAL | Status: DC | PRN
  Administered 2024-08-19 – 2024-08-22 (×5): 1 via ORAL
  Filled 2024-08-19 (×5): qty 1

## 2024-08-19 MED ORDER — ENOXAPARIN SODIUM 40 MG/0.4ML IJ SOSY
40.0000 mg | PREFILLED_SYRINGE | Freq: Every day | INTRAMUSCULAR | Status: DC
  Administered 2024-08-19 – 2024-08-22 (×4): 40 mg via SUBCUTANEOUS
  Filled 2024-08-19 (×4): qty 0.4

## 2024-08-19 MED ORDER — POTASSIUM CHLORIDE CRYS ER 20 MEQ PO TBCR
40.0000 meq | EXTENDED_RELEASE_TABLET | Freq: Once | ORAL | Status: AC
  Administered 2024-08-19: 40 meq via ORAL
  Filled 2024-08-19: qty 2

## 2024-08-19 MED ORDER — SODIUM CHLORIDE 0.9 % IV SOLN: INTRAVENOUS | Status: AC

## 2024-08-19 MED ORDER — ONDANSETRON HCL 4 MG/2ML IJ SOLN
4.0000 mg | Freq: Once | INTRAMUSCULAR | Status: AC
  Administered 2024-08-19: 4 mg via INTRAVENOUS
  Filled 2024-08-19: qty 2

## 2024-08-19 MED ORDER — ACETAMINOPHEN 325 MG PO TABS: 650.0000 mg | ORAL_TABLET | Freq: Four times a day (QID) | ORAL | Status: DC | PRN

## 2024-08-19 MED ORDER — KETOROLAC TROMETHAMINE 30 MG/ML IJ SOLN
30.0000 mg | Freq: Once | INTRAMUSCULAR | Status: AC
  Administered 2024-08-19: 30 mg via INTRAVENOUS
  Filled 2024-08-19: qty 1

## 2024-08-19 NOTE — Progress Notes (Signed)
 Same day note  [name] is a 43 y.o. female with a past medical history of staghorn renal calculus, history of urinary tract infection in the past presented to the hospital with lower abdominal pain and back pain for 3 weeks with burning urination without any fever.  She did have some nausea and vomiting.  In the ED urinalysis was abnormal.  CT scan of the abdomen showed right sided staghorn calculus.  Patient was thought to have pyelonephritis and was admitted hospital for further evaluation and treatment.   Patient seen and examined at bedside.  Patient was admitted to the hospital for flank and suprapubic pain  At the time of my evaluation, patient complains of mild pain.  No dysuria or urgency.  Physical examination reveals obese built female, not in obvious distress, no tenderness on palpation  Laboratory data and imaging was reviewed  Assessment and Plan.  Acute pyelonephritis in the setting of right staghorn calculus:  Imaging showed some perinephric fat stranding and abnormal enhancement suggestive of possible pyelonephritis.  Urinalysis with more than 50 white cells.  Follow-up urine cultures, continue IV Rocephin .  Urine Cultures from 2023 grew Proteus which was sensitive to ceftriaxone  will need to follow-up with urology as outpatient.  Hypokalemia: Mild.  Potassium 3.4 on presentation.  Has been replenished.  Check levels in AM.   Microcytic anemia: Check anemia panel.   No Charge  Signed,  Vernal Anselm Alstrom, MD [facility]

## 2024-08-19 NOTE — ED Provider Notes (Signed)
 Patient signed out to me at shift change.  She comes in with 2 weeks worth of suprapubic and flank pain.  CT is notable for a staghorn calculus with perinephric stranding and concern for pyelonephritis.  The patient's pain is still poorly controlled despite IV morphine .  Will give dose of Toradol .  She states that she does not feel comfortable going home due to the pain and her nausea.  I do feel that it would be reasonable to admit her for IV antibiotic.  Appreciate Dr. Verdene for admitting.   Vicky Charleston, PA-C 08/19/24 0136    Jerral Meth, MD 08/19/24 [phone]

## 2024-08-19 NOTE — ED Notes (Addendum)
 Patient is resting at this time.

## 2024-08-19 NOTE — Hospital Course (Addendum)
 Same day note  [name] is a 43 y.o. female with a past medical history of staghorn renal calculus, history of urinary tract infection in the past presented to the hospital with lower abdominal pain and back pain for 3 weeks with burning urination without any fever.  She did have some nausea and vomiting.  In the ED urinalysis was abnormal.  CT scan of the abdomen showed right sided staghorn calculus.  Patient was thought to have pyelonephritis and was admitted hospital for further evaluation and treatment.   Patient seen and examined at bedside.  Patient was admitted to the hospital for  At the time of my evaluation, patient complains of Physical examination reveals  Laboratory data and imaging was reviewed  Assessment and Plan.   Acute pyelonephritis in the setting of staghorn calculus:  Urinalysis with more than 50 white cells.  Follow-up urine cultures, continue IV Rocephin .  Urine Cultures from 2023 grew Proteus which was sensitive to ceftriaxone  will need to follow-up with urology as outpatient.  Hypokalemia: Mild.  Potassium 3.4 on presentation.  Has been replenished.  Check levels in AM.   Microcytic anemia: Check anemia panel.   No Charge  Signed,  Vernal Anselm Alstrom, MD [facility]

## 2024-08-19 NOTE — H&P (Signed)
 [facility] History and Physical  [name] FMW:[id number] DOB: [birth year] DOA: 08/18/2024   PCP: System, Provider Not In  Specialists: None  Chief Complaint: Flank pain and suprapubic pain  HPI: [name] is a 43 y.o. female with a past medical history of staghorn renal calculus, history of urinary tract infections who presented with the lower abdominal pain and back pain ongoing for 3 weeks.  No fever or chills.  But has had nausea and vomiting.  Denies any blood in the emesis.  Has had some burning sensation with urination.  No chest pain or shortness of breath.  Evaluation in the emergency department raise concern for urinary tract infection.  Because of her flank pain a CT was done which showed the right sided staghorn calculus.  Patient was thought to have acute pyelonephritis.  She was hospitalized for further management.  Home Medications: This list is not reconciled yet. Prior to Admission medications   Medication Sig Start Date End Date Taking? Authorizing Provider  albuterol  (VENTOLIN  HFA) 108 (90 Base) MCG/ACT inhaler Inhale 1-2 puffs into the lungs every 6 (six) hours as needed for wheezing or shortness of breath. 10/14/22   Hazen Darryle BRAVO, FNP  aspirin 325 MG EC tablet Take 325 mg by mouth as needed (for chest pain).    [provider]  benzonatate  (TESSALON ) 100 MG capsule Take 1 capsule (100 mg total) by mouth every 8 (eight) hours as needed for cough. 12/06/22   Billy Asberry FALCON, PA-C  cephALEXin  (KEFLEX ) 500 MG capsule Take 1 capsule (500 mg total) by mouth 4 (four) times daily. 02/23/24   Laurice Maude BROCKS, MD  naproxen  (NAPROSYN ) 500 MG tablet Take 1 tablet (500 mg total) by mouth 2 (two) times daily. 01/22/22   Geroldine Berg, MD  ondansetron  (ZOFRAN -ODT) 4 MG disintegrating tablet Take 1 tablet (4 mg total) by mouth every 8 (eight) hours as needed for nausea or vomiting. 12/15/22   Jerrol Agent, MD    Allergies: No Known  Allergies  Past Medical History: Past Medical History:  Diagnosis Date   Carpal tunnel syndrome on both sides 01/2016   Headache    seasonal   Headache syndrome 05/26/2019   Non-insulin  dependent type 2 diabetes mellitus (HCC)    states only uses Insulin  as needed   Pneumonia due to COVID-19 virus    Pyelonephritis 11/23/2019    Past Surgical History:  Procedure Laterality Date   BILATERAL CARPAL TUNNEL RELEASE Bilateral 02/06/2016   Procedure: BILATERAL CARPAL TUNNEL RELEASE;  Surgeon: Kay CHRISTELLA Cummins, MD;  Location: [facility];  Service: Orthopedics;  Laterality: Bilateral;   NO PAST SURGERIES      Social History: Lives in [place].  No smoking or alcohol use.  Family History:  Family History  Problem Relation Age of Onset   Healthy Mother    Healthy Father    Diabetes Neg Hx    Hypertension Neg Hx    Hyperlipidemia Neg Hx    Breast cancer Neg Hx      Review of Systems - History obtained from the patient General ROS: positive for  - fatigue Psychological ROS: negative Ophthalmic ROS: negative ENT ROS: negative Allergy and Immunology ROS: negative Hematological and Lymphatic ROS: negative Endocrine ROS: negative Respiratory ROS: no cough, shortness of breath, or wheezing Cardiovascular ROS: no chest pain or dyspnea on exertion Gastrointestinal ROS: As in HPI Genito-Urinary ROS: As in HPI Musculoskeletal ROS: negative Neurological ROS: no TIA or stroke symptoms  Dermatological ROS: negative  Physical Examination  Vitals:   08/18/24 1356 08/18/24 1816 08/19/24 0143  BP: (!) 129/54 (!) 104/56 112/66  Pulse: 89 73 71  Resp: 16 14 18   Temp: 98.1 F (36.7 C) 98.2 F (36.8 C) 98.3 F (36.8 C)  SpO2: 100% 100% 99%    BP 112/66   Pulse 71   Temp 98.3 F (36.8 C)   Resp 18   SpO2 99%   General appearance: alert, cooperative, appears stated age, and no distress Head: Normocephalic, without obvious abnormality, atraumatic Eyes:  conjunctivae/corneas clear. PERRL, EOM's intact.  Throat: lips, mucosa, and tongue normal; teeth and gums normal Resp: clear to auscultation bilaterally Cardio: regular rate and rhythm, S1, S2 normal, no murmur, click, rub or gallop GI: soft, non-tender; bowel sounds normal; no masses,  no organomegaly.  No CVA tenderness is noted. Extremities: extremities normal, atraumatic, no cyanosis or edema Pulses: 2+ and symmetric Skin: Skin color, texture, turgor normal. No rashes or lesions Lymph nodes: Cervical, supraclavicular, and axillary nodes normal. Neurologic: Alert and oriented x 3.  Cranial nerves II to XII intact.  Motor strength equal bilateral upper and lower extremities.   Labs on Admission: I have personally reviewed following labs and imaging studies  CBC: Recent Labs  Lab 08/18/24 1539 08/18/24 1547  WBC 8.9  --   NEUTROABS 5.0  --   HGB 11.0* 12.9  HCT 38.0 38.0  MCV 75.4*  --   PLT 477*  --    Basic Metabolic Panel: Recent Labs  Lab 08/18/24 1539 08/18/24 1547  NA 138 139  K 3.4* 3.4*  CL 106 107  CO2 20*  --   GLUCOSE 141* 147*  BUN 8 9  CREATININE 0.86 0.80  CALCIUM  9.0  --   MG 1.8  --    GFR: CrCl cannot be calculated (Unknown ideal weight.). Liver Function Tests: Recent Labs  Lab 08/18/24 1539  AST 14*  ALT 8  ALKPHOS 80  BILITOT 0.3  PROT 8.1  ALBUMIN 3.5   Recent Labs  Lab 08/18/24 1539  LIPASE 38    Radiological Exams on Admission: CT ABDOMEN PELVIS W CONTRAST Result Date: 08/19/2024 CLINICAL DATA:  Acute abdominal pain.  Low back pain. EXAM: CT ABDOMEN AND PELVIS WITH CONTRAST TECHNIQUE: Multidetector CT imaging of the abdomen and pelvis was performed using the standard protocol following bolus administration of intravenous contrast. RADIATION DOSE REDUCTION: This exam was performed according to the departmental dose-optimization program which includes automated exposure control, adjustment of the mA and/or kV according to patient size  and/or use of iterative reconstruction technique. CONTRAST:  75mL OMNIPAQUE  IOHEXOL  350 MG/ML SOLN COMPARISON:  CT abdomen and pelvis 11/24/2019 FINDINGS: Lower chest: No acute abnormality. Hepatobiliary: No focal liver abnormality is seen. No gallstones, gallbladder wall thickening, or biliary dilatation. Pancreas: Unremarkable. No pancreatic ductal dilatation or surrounding inflammatory changes. Spleen: Normal in size without focal abnormality. Adrenals/Urinary Tract: The adrenal glands, left kidney and bladder are within normal limits. Staghorn type calculus in the right kidney is again seen with additional scattered right renal calculi measuring up to 9 mm. There are multiple areas of cortical scarring. There is right perinephric fat stranding. There is abnormal patchy enhancement pattern throughout the superior pole the right kidney. There some cystic areas in the inferior pole which may represent dilated calices or cysts. There is no hydronephrosis. There is no significant excretion of contrast from the right kidney. Stomach/Bowel: Stomach is within normal limits. Appendix is not seen.  There is sigmoid colon diverticulosis. No evidence of bowel wall thickening, distention, or inflammatory changes. Vascular/Lymphatic: There are prominent retroperitoneal lymph nodes measuring up to 9 mm at the level of the kidneys. There is a stable nodular density measuring 7 mm in the right retroperitoneal region inferior to the right kidney image 3/49. Aorta and IVC are normal in size. There are atherosclerotic calcifications of the aorta. Reproductive: Uterus and bilateral adnexa are unremarkable. Other: No abdominal wall hernia or abnormality. No abdominopelvic ascites. Musculoskeletal: No fracture is seen. IMPRESSION: 1. Staghorncalculus in the right kidney with additional scattered right renal calculi. Right perinephric fat stranding and abnormal patchy enhancement pattern in the superior pole of the right kidney. There  are cystic areas in the inferior pole which may be related to dilated calices. 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 Underlying renal neoplasm not excluded. 2. Prominent retroperitoneal lymph nodes are likely reactive. 3. Sigmoid colon diverticulosis. 4. Aortic atherosclerosis. Aortic Atherosclerosis (ICD10-I70.0). Electronically Signed   By: Greig Pique M.D.   On: 08/19/2024 01:05      Problem List  Principal Problem:   Acute pyelonephritis Active Problems:   Hypokalemia   Microcytic anemia   Assessment: This is a 43 year old female who comes in with flank pain and lower abdominal pain.  She is found to have abnormal UA suggesting urinary tract infection.  CT scan of the abdomen pelvis raises concern for pyelonephritis.  Patient has a known right sided staghorn calculus.  Plan: Acute pyelonephritis in the setting of staghorn calculus: CT report reviewed.  Similar findings to CT scan from 2020.  Patient will be placed on antibiotics.  She was given ceftriaxone  in the ED which should be continued.  Follow-up on urine cultures.  Cultures from 2023 grew Proteus which was sensitive to ceftriaxone .  Although only 10,000 colonies grew at that time.  WBC is noted to be normal.  Patient will need to be seen by urology but this can be accomplished in the outpatient setting.  Urine pregnancy test is negative.  Hypokalemia: Will be supplemented.  Magnesium  is 1.8 which will be supplemented as well.  Microcytic anemia: Check anemia panel.   DVT Prophylaxis: Lovenox  Code Status: Full code Family Communication: Discussed with patient Disposition: Return home when improved Consults called: None yet Admission Status: Status is: Inpatient Remains inpatient appropriate because: Acute perinephritis requiring IV antibiotics    Severity of Illness: The appropriate patient status for this patient is INPATIENT. Inpatient status is judged to be reasonable and  necessary in order to provide the required intensity of service to ensure the patient's safety. The patient's presenting symptoms, physical exam findings, and initial radiographic and laboratory data in the context of their chronic comorbidities is felt to place them at high risk for further clinical deterioration. Furthermore, it is not anticipated that the patient will be medically stable for discharge from the hospital within 2 midnights of admission.   * I certify that at the point of admission it is my clinical judgment that the patient will require inpatient hospital care spanning beyond 2 midnights from the point of admission due to high intensity of service, high risk for further deterioration and high frequency of surveillance required.*   Further management decisions will depend on results of further testing and patient's response to treatment.   Ellene Bloodsaw  [facility] Pager on [email or web address]  08/19/2024, 3:11 AM

## 2024-08-20 LAB — COMPREHENSIVE METABOLIC PANEL WITH GFR
ALT: 8 U/L (ref 0–44)
AST: 15 U/L (ref 15–41)
Albumin: 2.8 g/dL — ABNORMAL LOW (ref 3.5–5.0)
Alkaline Phosphatase: 65 U/L (ref 38–126)
Anion gap: 8 (ref 5–15)
BUN: 7 mg/dL (ref 6–20)
CO2: 20 mmol/L — ABNORMAL LOW (ref 22–32)
Calcium: 8.3 mg/dL — ABNORMAL LOW (ref 8.9–10.3)
Chloride: 108 mmol/L (ref 98–111)
Creatinine, Ser: 0.82 mg/dL (ref 0.44–1.00)
GFR, Estimated: >60 mL/min (ref >60)
Glucose, Bld: 93 mg/dL (ref 70–99)
Potassium: 4.1 mmol/L (ref 3.5–5.1)
Sodium: 136 mmol/L (ref 135–145)
Total Bilirubin: 0.3 mg/dL (ref 0.0–1.2)
Total Protein: 6.8 g/dL (ref 6.5–8.1)

## 2024-08-20 LAB — RETICULOCYTES
Immature Retic Fract: 10 % (ref 2.3–15.9)
RBC.: 4.24 MIL/uL (ref 3.87–5.11)
Retic Count, Absolute: 36.9 K/uL (ref 19.0–186.0)
Retic Ct Pct: 0.9 % (ref 0.4–3.1)

## 2024-08-20 LAB — CBC
HCT: 30.8 % — ABNORMAL LOW (ref 36.0–46.0)
Hemoglobin: 9.2 g/dL — ABNORMAL LOW (ref 12.0–15.0)
MCH: 21.9 pg — ABNORMAL LOW (ref 26.0–34.0)
MCHC: 29.9 g/dL — ABNORMAL LOW (ref 30.0–36.0)
MCV: 73.3 fL — ABNORMAL LOW (ref 80.0–100.0)
Platelets: 361 K/uL (ref 150–400)
RBC: 4.2 MIL/uL (ref 3.87–5.11)
RDW: 16.8 % — ABNORMAL HIGH (ref 11.5–15.5)
WBC: 10.3 K/uL (ref 4.0–10.5)
nRBC: 0 % (ref 0.0–0.2)

## 2024-08-20 LAB — FOLATE: Folate: 12.2 ng/mL (ref >5.9)

## 2024-08-20 LAB — IRON AND TIBC
Iron: 23 ug/dL — ABNORMAL LOW (ref 28–170)
Saturation Ratios: 6 % — ABNORMAL LOW (ref 10.4–31.8)
TIBC: 379 ug/dL (ref 250–450)
UIBC: 356 ug/dL

## 2024-08-20 LAB — FERRITIN: Ferritin: 10 ng/mL — ABNORMAL LOW (ref 11–307)

## 2024-08-20 LAB — HIV ANTIBODY (ROUTINE TESTING W REFLEX): HIV Screen 4th Generation wRfx: NONREACTIVE

## 2024-08-20 LAB — VITAMIN B12: Vitamin B-12: 302 pg/mL (ref 180–914)

## 2024-08-20 LAB — MAGNESIUM: Magnesium: 1.8 mg/dL (ref 1.7–2.4)

## 2024-08-20 MED ORDER — BUTALBITAL-APAP-CAFFEINE 50-325-40 MG PO TABS
1.0000 | ORAL_TABLET | Freq: Four times a day (QID) | ORAL | Status: AC | PRN
  Administered 2024-08-20 (×2): 1 via ORAL
  Filled 2024-08-20 (×3): qty 1

## 2024-08-20 MED ORDER — CYANOCOBALAMIN 1000 MCG/ML IJ SOLN
1000.0000 ug | Freq: Every day | INTRAMUSCULAR | Status: DC
  Administered 2024-08-20 – 2024-08-22 (×3): 1000 ug via SUBCUTANEOUS
  Filled 2024-08-20 (×3): qty 1

## 2024-08-20 MED ORDER — FERROUS SULFATE 325 (65 FE) MG PO TABS
325.0000 mg | ORAL_TABLET | Freq: Two times a day (BID) | ORAL | Status: DC
  Administered 2024-08-20 – 2024-08-22 (×4): 325 mg via ORAL
  Filled 2024-08-20 (×5): qty 1

## 2024-08-20 NOTE — Progress Notes (Addendum)
 PROGRESS NOTE    [name]  FMW:[id number] DOB: 1981-04-29 DOA: 08/18/2024 PCP: System, Provider Not In   Chief Complaint  Patient presents with   Back Pain    Brief Narrative:   [name] is a 43 y.o. female with a past medical history of staghorn renal calculus, history of urinary tract infections who presented with the lower abdominal pain and back pain ongoing for 3 weeks.  No fever or chills.  But has had nausea and vomiting.  Denies any blood in the emesis.  Has had some burning sensation with urination.  No chest pain or shortness of breath.   Evaluation in the emergency department raise concern for urinary tract infection.  Because of her flank pain a CT was done which showed the right sided staghorn calculus.  Patient was thought to have acute pyelonephritis.  She was hospitalized for further management.  Assessment & Plan:   Principal Problem:   Acute pyelonephritis Active Problems:   Hypokalemia   Microcytic anemia   Acute pyelonephritis in the setting of right staghorn calculus:  -Imaging showed some perinephric fat stranding and abnormal enhancement suggestive of possible pyelonephritis.  He has tenderness in right CVA area, urinalysis with more than 50 white cells.  - continue IV Rocephin . - Imaging, clinical finding of presentation indicates acute pyelonephritis despite urine culture growing less than 10,000 pathogens (it was the same in 2023, but it ended growing Proteus, which was sensitive to ceftriaxone  which we will continue for now. - Cussed with Dr. Carolee from urology regarding her staghorn calculus, reviewed imaging, concern for right knee nonfunctional at this point, recommends Lasix  renogram to evaluate if right kidney still functional or not,, and further workup can be pursued as an outpatient.    Hypokalemia: - Replaced    Microcytic anemia Iron deficiency anemia - Anemia panel significant for low iron and borderline B12  level, will start on B12 and iron supplements      DVT prophylaxis: Lovenox  Code Status: Full code Family Communication: Husband at bedside Disposition:   Status is: Inpatient    Consultants:  None  Subjective:  Reports right flank pain, afebrile, she reports headache earlier this morning, currently resolved.  Objective: Vitals:   08/19/24 1055 08/19/24 1935 08/20/24 0757 08/20/24 1158  BP:  (!) 99/50 (!) 92/58 (!) 94/58  Pulse:  (!) 59 72 (!) 58  Resp:    15  Temp: 97.7 F (36.5 C) 98 F (36.7 C) 98.7 F (37.1 C) 98.3 F (36.8 C)  TempSrc:  Oral Oral Oral  SpO2:  99% 98% 97%  Weight:      Height:        Intake/Output Summary (Last 24 hours) at 08/20/2024 1206 Last data filed at 08/20/2024 0600 Gross per 24 hour  Intake 829.35 ml  Output --  Net 829.35 ml   Filed Weights   08/19/24 0430  Weight: 60 kg    Examination:  Awake Alert, Oriented X 3, No new F.N deficits, Normal affect Symmetrical Chest wall movement, Good air movement bilaterally, CTAB RRR,No Gallops,Rubs or new Murmurs, No Parasternal Heave +ve B.Sounds, Abd Soft, has right CVA tenderness No Cyanosis, Clubbing or edema, No new Rash or bruise       Data Reviewed: I have personally reviewed following labs and imaging studies  CBC: Recent Labs  Lab 08/18/24 1539 08/18/24 1547 08/20/24 0628  WBC 8.9  --  10.3  NEUTROABS 5.0  --   --  HGB 11.0* 12.9 9.2*  HCT 38.0 38.0 30.8*  MCV 75.4*  --  73.3*  PLT 477*  --  361    Basic Metabolic Panel: Recent Labs  Lab 08/18/24 1539 08/18/24 1547 08/20/24 0628  NA 138 139 136  K 3.4* 3.4* 4.1  CL 106 107 108  CO2 20*  --  20*  GLUCOSE 141* 147* 93  BUN 8 9 7   CREATININE 0.86 0.80 0.82  CALCIUM  9.0  --  8.3*  MG 1.8  --  1.8    GFR: Estimated Creatinine Clearance: 74.4 mL/min (by C-G formula based on SCr of 0.82 mg/dL).  Liver Function Tests: Recent Labs  Lab 08/18/24 1539 08/20/24 0628  AST 14* 15  ALT 8 8  ALKPHOS 80 65   BILITOT 0.3 0.3  PROT 8.1 6.8  ALBUMIN 3.5 2.8*    CBG: No results for input(s): GLUCAP in the last 168 hours.   Recent Results (from the past 240 hours)  Urine Culture     Status: Abnormal   Collection Time: 08/18/24  9:20 PM   Specimen: Urine, Clean Catch  Result Value Ref Range Status   Specimen Description URINE, CLEAN CATCH  Final   Special Requests NONE  Final   Culture (A)  Final    <10,000 COLONIES/mL INSIGNIFICANT GROWTH Performed at [facility], [address] [place], [place] [address]    Report Status 08/19/2024 FINAL  Final         Radiology Studies: CT ABDOMEN PELVIS W CONTRAST Result Date: 08/19/2024 CLINICAL DATA:  Acute abdominal pain.  Low back pain. EXAM: CT ABDOMEN AND PELVIS WITH CONTRAST TECHNIQUE: Multidetector CT imaging of the abdomen and pelvis was performed using the standard protocol following bolus administration of intravenous contrast. RADIATION DOSE REDUCTION: This exam was performed according to the departmental dose-optimization program which includes automated exposure control, adjustment of the mA and/or kV according to patient size and/or use of iterative reconstruction technique. CONTRAST:  75mL OMNIPAQUE  IOHEXOL  350 MG/ML SOLN COMPARISON:  CT abdomen and pelvis 11/24/2019 FINDINGS: Lower chest: No acute abnormality. Hepatobiliary: No focal liver abnormality is seen. No gallstones, gallbladder wall thickening, or biliary dilatation. Pancreas: Unremarkable. No pancreatic ductal dilatation or surrounding inflammatory changes. Spleen: Normal in size without focal abnormality. Adrenals/Urinary Tract: The adrenal glands, left kidney and bladder are within normal limits. Staghorn type calculus in the right kidney is again seen with additional scattered right renal calculi measuring up to 9 mm. There are multiple areas of cortical scarring. There is right perinephric fat stranding. There is abnormal patchy enhancement pattern throughout the  superior pole the right kidney. There some cystic areas in the inferior pole which may represent dilated calices or cysts. There is no hydronephrosis. There is no significant excretion of contrast from the right kidney. Stomach/Bowel: Stomach is within normal limits. Appendix is not seen. There is sigmoid colon diverticulosis. No evidence of bowel wall thickening, distention, or inflammatory changes. Vascular/Lymphatic: There are prominent retroperitoneal lymph nodes measuring up to 9 mm at the level of the kidneys. There is a stable nodular density measuring 7 mm in the right retroperitoneal region inferior to the right kidney image 3/49. Aorta and IVC are normal in size. There are atherosclerotic calcifications of the aorta. Reproductive: Uterus and bilateral adnexa are unremarkable. Other: No abdominal wall hernia or abnormality. No abdominopelvic ascites. Musculoskeletal: No fracture is seen. IMPRESSION: 1. Staghorncalculus in the right kidney with additional scattered right renal calculi. Right perinephric fat stranding and  abnormal patchy enhancement pattern in the superior pole of the right kidney. There are cystic areas in the inferior pole which may be related to dilated calices. 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 Underlying renal neoplasm not excluded. 2. Prominent retroperitoneal lymph nodes are likely reactive. 3. Sigmoid colon diverticulosis. 4. Aortic atherosclerosis. Aortic Atherosclerosis (ICD10-I70.0). Electronically Signed   By: Greig Pique M.D.   On: 08/19/2024 01:05        Scheduled Meds:  enoxaparin  (LOVENOX ) injection  40 mg Subcutaneous Daily   Continuous Infusions:  cefTRIAXone  (ROCEPHIN )  IV 2 g (08/19/24 2147)     LOS: 1 day       Brayton Lye, MD [facility]   To contact the attending provider between 7A-7P or the covering provider during after hours 7P-7A, please log into the web site [email or web address] and access  using universal [place] password for that web site. If you do not have the password, please call the hospital operator.  08/20/2024, 12:06 PM

## 2024-08-20 NOTE — Plan of Care (Signed)
 Pt has rested quietly throughout the night with no distress noted. Alert and oriented. On room air. Pt is not on tele monitor. Up ad lib to BR. NS at 100. Medicated twice for pain with relief noted. No other complaints voiced.     Problem: Education: Goal: Knowledge of General Education information will improve Description: Including pain rating scale, medication(s)/side effects and non-pharmacologic comfort measures Outcome: Progressing   Problem: Coping: Goal: Level of anxiety will decrease Outcome: Progressing   Problem: Pain Managment: Goal: General experience of comfort will improve and/or be controlled Outcome: Progressing

## 2024-08-20 NOTE — Consult Note (Addendum)
 H&P Physician requesting consult: Dawood Elgergawy  Chief Complaint: Right pyelonephritis with staghorn calculus  History of Present Illness: 43 year old female with history of staghorn calculus and history of recurrent UTIs presenting with lower abdominal pain and flank pain for the past 3 weeks.  Also had some nausea and vomiting and dysuria.  She was admitted for pyelonephritis.  CT scan was performed that revealed staghorn calculus in the right kidney with additional scattered renal calculi and perinephric fat stranding with patchy enhancement and dilated cystic areas from chronic pyelonephritis.  Underlying neoplasm could not be excluded.  She had prominent retroperitoneal lymph nodes, likely reactive.  Urine culture currently with less than 10,000 colonies of insignificant growth.  Patient's pain has improved in the hospital on ceftriaxone .  Past Medical History:  Diagnosis Date   Carpal tunnel syndrome on both sides 01/2016   Headache    seasonal   Headache syndrome 05/26/2019   Non-insulin  dependent type 2 diabetes mellitus (HCC)    states only uses Insulin  as needed   Pneumonia due to COVID-19 virus    Pyelonephritis 11/23/2019   Past Surgical History:  Procedure Laterality Date   BILATERAL CARPAL TUNNEL RELEASE Bilateral 02/06/2016   Procedure: BILATERAL CARPAL TUNNEL RELEASE;  Surgeon: Kay CHRISTELLA Cummins, MD;  Location: [facility];  Service: Orthopedics;  Laterality: Bilateral;   NO PAST SURGERIES      Home Medications:  Medications Prior to Admission  Medication Sig Dispense Refill Last Dose/Taking   aspirin 325 MG EC tablet Take 325 mg by mouth as needed (for chest pain).      naproxen  (NAPROSYN ) 500 MG tablet Take 1 tablet (500 mg total) by mouth 2 (two) times daily. 20 tablet 0    ondansetron  (ZOFRAN -ODT) 4 MG disintegrating tablet Take 1 tablet (4 mg total) by mouth every 8 (eight) hours as needed for nausea or vomiting. 20 tablet 0    Allergies: No Known  Allergies  Family History  Problem Relation Age of Onset   Healthy Mother    Healthy Father    Diabetes Neg Hx    Hypertension Neg Hx    Hyperlipidemia Neg Hx    Breast cancer Neg Hx    Social History:  reports that she has never smoked. She has never used smokeless tobacco. She reports that she does not drink alcohol and does not use drugs.  ROS: A complete review of systems was performed.  All systems are negative except for pertinent findings as noted. ROS   Physical Exam:  Vital signs in last 24 hours: Temp:  [98 F (36.7 C)-98.7 F (37.1 C)] 98.3 F (36.8 C) (09/06 1158) Pulse Rate:  [58-72] 58 (09/06 1158) Resp:  [15] 15 (09/06 1158) BP: (92-99)/(50-58) 94/58 (09/06 1158) SpO2:  [97 %-99 %] 97 % (09/06 1158) General:  Alert and oriented, No acute distress HEENT: Normocephalic, atraumatic Neck: No JVD or lymphadenopathy Cardiovascular: Regular rate and rhythm Lungs: Regular rate and effort Abdomen: Soft, nontender, nondistended, no abdominal masses Back: No CVA tenderness Extremities: No edema Neurologic: Grossly intact  Laboratory Data:  Results for orders placed or performed during the hospital encounter of 08/18/24 (from the past 24 hours)  HIV Antibody (routine testing w rflx)     Status: None   Collection Time: 08/20/24  6:28 AM  Result Value Ref Range   HIV Screen 4th Generation wRfx Non Reactive Non Reactive  Comprehensive metabolic panel     Status: Abnormal   Collection Time: 08/20/24  6:28 AM  Result Value Ref Range   Sodium 136 135 - 145 mmol/L   Potassium 4.1 3.5 - 5.1 mmol/L   Chloride 108 98 - 111 mmol/L   CO2 20 (L) 22 - 32 mmol/L   Glucose, Bld 93 70 - 99 mg/dL   BUN 7 6 - 20 mg/dL   Creatinine, Ser 9.17 0.44 - 1.00 mg/dL   Calcium  8.3 (L) 8.9 - 10.3 mg/dL   Total Protein 6.8 6.5 - 8.1 g/dL   Albumin 2.8 (L) 3.5 - 5.0 g/dL   AST 15 15 - 41 U/L   ALT 8 0 - 44 U/L   Alkaline Phosphatase 65 38 - 126 U/L   Total Bilirubin 0.3 0.0 - 1.2  mg/dL   GFR, Estimated >39 >39 mL/min   Anion gap 8 5 - 15  CBC     Status: Abnormal   Collection Time: 08/20/24  6:28 AM  Result Value Ref Range   WBC 10.3 4.0 - 10.5 K/uL   RBC 4.20 3.87 - 5.11 MIL/uL   Hemoglobin 9.2 (L) 12.0 - 15.0 g/dL   HCT 69.1 (L) 63.9 - 53.9 %   MCV 73.3 (L) 80.0 - 100.0 fL   MCH 21.9 (L) 26.0 - 34.0 pg   MCHC 29.9 (L) 30.0 - 36.0 g/dL   RDW 83.1 (H) 88.4 - 84.4 %   Platelets 361 150 - 400 K/uL   nRBC 0.0 0.0 - 0.2 %  Magnesium      Status: None   Collection Time: 08/20/24  6:28 AM  Result Value Ref Range   Magnesium  1.8 1.7 - 2.4 mg/dL  Vitamin B12     Status: None   Collection Time: 08/20/24  6:28 AM  Result Value Ref Range   Vitamin B-12 302 180 - 914 pg/mL  Folate     Status: None   Collection Time: 08/20/24  6:28 AM  Result Value Ref Range   Folate 12.2 >5.9 ng/mL  Iron and TIBC     Status: Abnormal   Collection Time: 08/20/24  6:28 AM  Result Value Ref Range   Iron 23 (L) 28 - 170 ug/dL   TIBC 620 749 - 549 ug/dL   Saturation Ratios 6 (L) 10.4 - 31.8 %   UIBC 356 ug/dL  Ferritin     Status: Abnormal   Collection Time: 08/20/24  6:28 AM  Result Value Ref Range   Ferritin 10 (L) 11 - 307 ng/mL  Reticulocytes     Status: None   Collection Time: 08/20/24  6:28 AM  Result Value Ref Range   Retic Ct Pct 0.9 0.4 - 3.1 %   RBC. 4.24 3.87 - 5.11 MIL/uL   Retic Count, Absolute 36.9 19.0 - 186.0 K/uL   Immature Retic Fract 10.0 2.3 - 15.9 %   Recent Results (from the past 240 hours)  Urine Culture     Status: Abnormal   Collection Time: 08/18/24  9:20 PM   Specimen: Urine, Clean Catch  Result Value Ref Range Status   Specimen Description URINE, CLEAN CATCH  Final   Special Requests NONE  Final   Culture (A)  Final    <10,000 COLONIES/mL INSIGNIFICANT GROWTH Performed at [facility], [address], [place], [place] [address]    Report Status 08/19/2024 FINAL  Final   Creatinine: Recent Labs    08/18/24 1539 08/18/24 1547  08/20/24 0628  CREATININE 0.86 0.80 0.82   CT scan personally reviewed and is detailed in the history of  present illness  Impression/Assessment:  Right staghorn calculus Chronic right pyelonephritis  Plan:  Agree with IV antibiotics.  Once cultures speciate, would recommend 2 weeks of treatment followed by initiation of nightly low-dose antibiotic prophylaxis.  She needs a Lasix  renogram to evaluate renal function on the right to determine if she needs PCNL versus nephrectomy.  Renogram can be done outpatient if she does not need to be in the hospital otherwise.  Sherwood JONETTA Edison, III 08/20/2024, 4:04 PM

## 2024-08-21 LAB — CBC
HCT: 31.5 % — ABNORMAL LOW (ref 36.0–46.0)
Hemoglobin: 9.4 g/dL — ABNORMAL LOW (ref 12.0–15.0)
MCH: 21.7 pg — ABNORMAL LOW (ref 26.0–34.0)
MCHC: 29.8 g/dL — ABNORMAL LOW (ref 30.0–36.0)
MCV: 72.6 fL — ABNORMAL LOW (ref 80.0–100.0)
Platelets: 398 K/uL (ref 150–400)
RBC: 4.34 MIL/uL (ref 3.87–5.11)
RDW: 16.5 % — ABNORMAL HIGH (ref 11.5–15.5)
WBC: 8.9 K/uL (ref 4.0–10.5)
nRBC: 0 % (ref 0.0–0.2)

## 2024-08-21 LAB — BASIC METABOLIC PANEL WITH GFR
Anion gap: 10 (ref 5–15)
BUN: 8 mg/dL (ref 6–20)
CO2: 20 mmol/L — ABNORMAL LOW (ref 22–32)
Calcium: 8.6 mg/dL — ABNORMAL LOW (ref 8.9–10.3)
Chloride: 107 mmol/L (ref 98–111)
Creatinine, Ser: 0.76 mg/dL (ref 0.44–1.00)
GFR, Estimated: >60 mL/min (ref >60)
Glucose, Bld: 101 mg/dL — ABNORMAL HIGH (ref 70–99)
Potassium: 4 mmol/L (ref 3.5–5.1)
Sodium: 137 mmol/L (ref 135–145)

## 2024-08-21 NOTE — Plan of Care (Signed)
  Problem: Education: Goal: Knowledge of General Education information will improve Description: Including pain rating scale, medication(s)/side effects and non-pharmacologic comfort measures Outcome: Progressing   Problem: Clinical Measurements: Goal: Will remain free from infection Outcome: Progressing Goal: Diagnostic test results will improve Outcome: Progressing   Problem: Activity: Goal: Risk for activity intolerance will decrease Outcome: Progressing   Problem: Nutrition: Goal: Adequate nutrition will be maintained Outcome: Progressing

## 2024-08-21 NOTE — TOC Initial Note (Signed)
 Transition of Care [facility]) - Initial/Assessment Note    Patient Details  Name: [name] MRN: [id number] Date of Birth: [birth year]  Transition of Care [facility]) CM/SW Contact:    Marval Gell, RN Phone Number: 08/21/2024, 1:44 PM  Clinical Narrative:                  Patient admitted from home for pyelo and staghorn calculus.  Independent from home.  TOC will continue to follow.   Expected Discharge Plan: Home/Self Care Barriers to Discharge: Continued Medical Work up   Patient Goals and CMS Choice            Expected Discharge Plan and Services                                              Prior Living Arrangements/Services                       Activities of Daily Living   ADL Screening (condition at time of admission) Independently performs ADLs?: Yes (appropriate for developmental age) Is the patient deaf or have difficulty hearing?: No Does the patient have difficulty seeing, even when wearing glasses/contacts?: No Does the patient have difficulty concentrating, remembering, or making decisions?: No  Permission Sought/Granted                  Emotional Assessment              Admission diagnosis:  Amenorrhea [N91.2] Pyelonephritis [N12] Generalized abdominal pain [R10.84] Acute pyelonephritis [N10] Urinary tract infection with hematuria, site unspecified [N39.0, R31.9] Patient Active Problem List   Diagnosis Date Noted   Acute pyelonephritis 08/19/2024   Microcytic anemia 08/19/2024   COVID-19 virus detected 08/14/2020   Pyelonephritis 11/24/2019   Lymphadenopathy    Headache syndrome 05/26/2019   DM type 2 (diabetes mellitus, type 2) (HCC) 08/02/2015   Hydronephrosis 08/10/2014   Hypokalemia 08/10/2014   Nausea and vomiting 08/10/2014   Pyelonephritis, acute 08/10/2014   Hyperglycemia 08/10/2014   DUB (dysfunctional uterine bleeding) 07/14/2014   Uterine fibroid 07/14/2014   PCP:  System, Provider Not  In Pharmacy:   [id number] [place], [place] - [address] AT [facility] [address] [place] [address] Phone: [phone] Fax: [phone]  [facility] [address], [address] [place] [address] Phone: [phone] Fax: [phone]  [id number] [address] [place]), [place] - [address] [place] [address] Phone: [phone] Fax: [phone]     Social Drivers of Health (SDOH) Social History: SDOH Screenings   Food Insecurity: No Food Insecurity (08/19/2024)  Housing: Low Risk  (08/19/2024)  Transportation Needs: No Transportation Needs (08/19/2024)  Utilities: Not At Risk (08/19/2024)  Depression (PHQ2-9): Low Risk  (10/02/2020)  Tobacco Use: Low Risk  (08/18/2024)   SDOH Interventions:     Readmission Risk Interventions     No data to display

## 2024-08-21 NOTE — Progress Notes (Signed)
 PROGRESS NOTE    [name]  FMW:[id number] DOB: 12/10/1981 DOA: 08/18/2024 PCP: System, Provider Not In   Chief Complaint  Patient presents with   Back Pain    Brief Narrative:   [name] is a 43 y.o. female with a past medical history of staghorn renal calculus, history of urinary tract infections who presented with the lower abdominal pain and back pain ongoing for 3 weeks.  No fever or chills.  But has had nausea and vomiting.  Denies any blood in the emesis.  Has had some burning sensation with urination.  No chest pain or shortness of breath.   Evaluation in the emergency department raise concern for urinary tract infection.  Because of her flank pain a CT was done which showed the right sided staghorn calculus.  Patient was thought to have acute pyelonephritis.  She was hospitalized for further management.  Assessment & Plan:   Principal Problem:   Acute pyelonephritis Active Problems:   Hypokalemia   Microcytic anemia   Chronic right pyelonephritis in the setting of right staghorn calculus:  -Imaging showed some perinephric fat stranding and abnormal enhancement suggestive of possible pyelonephritis.  He has tenderness in right CVA area, urinalysis with more than 50 white cells.  - continue IV Rocephin . - Imaging, clinical finding of presentation indicates acute pyelonephritis despite urine culture growing less than 10,000 pathogens (it was the same in 2023, but it ended growing Proteus, which was sensitive to ceftriaxone  which we will continue for now. -Urology input greatly appreciated, will need 2 weeks treatment for her chronic pyelonephritis followed by initiation of nightly low-dose prophylactic antibiotic. - Will need Lasix  renogram, will discuss with nuclear medicine if available to be done tomorrow.  Hypokalemia: - Replaced    Microcytic anemia Iron deficiency anemia - Anemia panel significant for low iron and borderline B12  level, started on B12 and iron supplements      DVT prophylaxis: Lovenox  Code Status: Full code Family Communication: Husband at bedside Disposition:   Status is: Inpatient    Consultants:  None  Subjective:  Reports right flank pain, afebrile, she reports headache earlier this morning, currently resolved.  Objective: Vitals:   08/20/24 1949 08/20/24 2344 08/20/24 2345 08/21/24 0828  BP: (!) 99/53 (!) 90/52 100/65 109/62  Pulse: 69 68 75 78  Resp: 18   16  Temp: 98.8 F (37.1 C)  99.1 F (37.3 C) 97.9 F (36.6 C)  TempSrc: Oral  Oral Oral  SpO2: 100% 98% 99% 98%  Weight:      Height:       No intake or output data in the 24 hours ending 08/21/24 1104  Filed Weights   08/19/24 0430  Weight: 60 kg    Examination:  Awake Alert, Oriented X 3, No new F.N deficits, Normal affect Symmetrical Chest wall movement, Good air movement bilaterally, CTAB RRR,No Gallops,Rubs or new Murmurs, No Parasternal Heave +ve B.Sounds, Abd Soft, No tenderness, No rebound - guarding or rigidity. No Cyanosis, Clubbing or edema, No new Rash or bruise     Her CNA Dawna was available at time of evaluation and exam  Data Reviewed: I have personally reviewed following labs and imaging studies  CBC: Recent Labs  Lab 08/18/24 1539 08/18/24 1547 08/20/24 0628 08/21/24 0519  WBC 8.9  --  10.3 8.9  NEUTROABS 5.0  --   --   --   HGB 11.0* 12.9 9.2* 9.4*  HCT 38.0 38.0 30.8* 31.5*  MCV 75.4*  --  73.3* 72.6*  PLT 477*  --  361 398    Basic Metabolic Panel: Recent Labs  Lab 08/18/24 1539 08/18/24 1547 08/20/24 0628 08/21/24 0519  NA 138 139 136 137  K 3.4* 3.4* 4.1 4.0  CL 106 107 108 107  CO2 20*  --  20* 20*  GLUCOSE 141* 147* 93 101*  BUN 8 9 7 8   CREATININE 0.86 0.80 0.82 0.76  CALCIUM  9.0  --  8.3* 8.6*  MG 1.8  --  1.8  --     GFR: Estimated Creatinine Clearance: 76.2 mL/min (by C-G formula based on SCr of 0.76 mg/dL).  Liver Function Tests: Recent Labs   Lab 08/18/24 1539 08/20/24 0628  AST 14* 15  ALT 8 8  ALKPHOS 80 65  BILITOT 0.3 0.3  PROT 8.1 6.8  ALBUMIN 3.5 2.8*    CBG: No results for input(s): GLUCAP in the last 168 hours.   Recent Results (from the past 240 hours)  Urine Culture     Status: Abnormal   Collection Time: 08/18/24  9:20 PM   Specimen: Urine, Clean Catch  Result Value Ref Range Status   Specimen Description URINE, CLEAN CATCH  Final   Special Requests NONE  Final   Culture (A)  Final    <10,000 COLONIES/mL INSIGNIFICANT GROWTH Performed at [facility], [address] [place], [place] [address]    Report Status 08/19/2024 FINAL  Final         Radiology Studies: No results found.       Scheduled Meds:  cyanocobalamin   1,000 mcg Subcutaneous Daily   enoxaparin  (LOVENOX ) injection  40 mg Subcutaneous Daily   ferrous sulfate   325 mg Oral BID WC   Continuous Infusions:  cefTRIAXone  (ROCEPHIN )  IV 2 g (08/20/24 2118)     LOS: 2 days       Brayton Lye, MD [facility]   To contact the attending provider between 7A-7P or the covering provider during after hours 7P-7A, please log into the web site [email or web address] and access using universal [place] password for that web site. If you do not have the password, please call the hospital operator.  08/21/2024, 11:04 AM

## 2024-08-22 ENCOUNTER — Inpatient Hospital Stay (HOSPITAL_COMMUNITY)

## 2024-08-22 ENCOUNTER — Other Ambulatory Visit (HOSPITAL_COMMUNITY): Payer: Self-pay

## 2024-08-22 MED ORDER — TECHNETIUM TC 99M MERTIATIDE
5.0000 | Freq: Once | INTRAVENOUS | Status: AC | PRN
Start: 2024-08-22 — End: 2024-08-22
  Administered 2024-08-22: 5.5 via INTRAVENOUS

## 2024-08-22 MED ORDER — AMOXICILLIN-POT CLAVULANATE 875-125 MG PO TABS
1.0000 | ORAL_TABLET | Freq: Two times a day (BID) | ORAL | 0 refills | Status: DC
  Filled 2024-08-22: qty 20, 10d supply, fill #0

## 2024-08-22 MED ORDER — VITAMIN B-12 1000 MCG PO TABS
1000.0000 ug | ORAL_TABLET | Freq: Every day | ORAL | Status: DC
Start: 2024-08-22 — End: 2024-08-29

## 2024-08-22 MED ORDER — SODIUM CHLORIDE 0.9 % IV SOLN
1.0000 g | Freq: Once | INTRAVENOUS | Status: AC
  Administered 2024-08-22: 1 g via INTRAVENOUS
  Filled 2024-08-22: qty 10

## 2024-08-22 MED ORDER — FUROSEMIDE 10 MG/ML IJ SOLN
30.0000 mg | Freq: Once | INTRAMUSCULAR | Status: AC
Start: 2024-08-22 — End: 2024-08-22
  Administered 2024-08-22: 30 mg via INTRAVENOUS

## 2024-08-22 MED ORDER — FERROUS SULFATE 325 (65 FE) MG PO TABS: 325.0000 mg | ORAL_TABLET | Freq: Two times a day (BID) | ORAL | Status: DC

## 2024-08-22 MED ORDER — FUROSEMIDE 10 MG/ML IJ SOLN
INTRAMUSCULAR | Status: AC
  Filled 2024-08-22: qty 4

## 2024-08-22 NOTE — Plan of Care (Signed)
  Problem: Education: Goal: Knowledge of General Education information will improve Description: Including pain rating scale, medication(s)/side effects and non-pharmacologic comfort measures Outcome: Progressing   Problem: Health Behavior/Discharge Planning: Goal: Ability to manage health-related needs will improve Outcome: Progressing   Problem: Clinical Measurements: Goal: Will remain free from infection Outcome: Progressing Goal: Diagnostic test results will improve Outcome: Progressing Goal: Cardiovascular complication will be avoided Outcome: Progressing   Problem: Nutrition: Goal: Adequate nutrition will be maintained Outcome: Progressing

## 2024-08-22 NOTE — Discharge Summary (Signed)
 Physician Discharge Summary  [name] FMW:[id number] DOB: November 09, 1981 DOA: 08/18/2024  PCP: System, Provider Not In  Admit date: 08/18/2024 Discharge date: 08/22/2024  Admitted From: (Home) Disposition:  (Home )  Recommendations for Outpatient Follow-up:  Follow up with PCP in 1-2 weeks Please obtain BMP/CBC in one week Please follow up on the final results of Lasix  renogram   Diet recommendation:  Brief/Interim Summary:  [name] is a 43 y.o. female with a past medical history of staghorn renal calculus, history of recurrent urinary tract infections in the past, with urine culture growing Proteus on different occasions from 2015, she presented with the lower abdominal pain and back pain ongoing for 3 weeks.  No fever or chills.  But has had nausea and vomiting.  Denies any blood in the emesis.  Has had some burning sensation with urination.  No chest pain or shortness of breath.   Evaluation in the emergency department raise concern for urinary tract infection.  Because of her flank pain a CT was done which showed the right sided staghorn calculus.  Patient was thought to have  pyelonephritis.  She was hospitalized for further management.  He was seen by urology, please see discussion below.   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  He has tenderness in right CVA area, urinalysis with more than 50 white cells.  - He was treated with IV Rocephin  during hospital stay, recommendation for prolonged antibiotic course to treat her chronic polynephritis, as discussed with ID, can be discharged on Augmentin , will give her prescription to finish total of 14 days of antibiotic treatment, upon my discussion with ID they would recommend against leaving her on low-dose suppressive therapy as it would build resistance. -Urology input  greatly appreciated, Will need Lasix  renogram, will be performed today prior to discharge, she can follow on the results with urology as an outpatient, will arrange for follow-up appointment, definitive management will include either stone management if she has good residual renal function on her renogram or right nephrectomy if she does not have good function and renogram.      Hypokalemia: - Replaced     Microcytic anemia Iron deficiency anemia - Anemia panel significant for low iron and borderline B12 level, started on B12 and iron supplements, she reports heavy menstrual period, so I have discussed with her to follow-up with GYN as an outpatient as well.    Discharge Diagnoses:  Principal Problem:   Acute pyelonephritis Active Problems:   Hypokalemia   Microcytic anemia    Discharge Instructions  Discharge Instructions     Diet - low sodium heart healthy   Complete by: As directed    Discharge instructions   Complete by: As directed    Follow with Primary MD   Get CBC, CMP, checked  by Primary MD next visit.    Disposition Home    Diet: Regular Diet   On your next visit with your primary care physician please Get Medicines reviewed and adjusted.   Please request your Prim.MD to go over all Hospital Tests and Procedure/Radiological results at the follow up, please get all Hospital records sent to your Prim MD by signing hospital release before you go home.   If you experience worsening of your admission symptoms, develop shortness of breath, life threatening emergency, suicidal or homicidal thoughts you must seek medical attention immediately by calling  911 or calling your MD immediately  if symptoms less severe.  You Must read complete instructions/literature along with all the possible adverse reactions/side effects for all the Medicines you take and that have been prescribed to you. Take any new Medicines after you have completely understood and accpet all the  possible adverse reactions/side effects.   Do not drive, operating heavy machinery, perform activities at heights, swimming or participation in water activities or provide baby sitting services if your were admitted for syncope or siezures until you have seen by Primary MD or a Neurologist and advised to do so again.  Do not drive when taking Pain medications.    Do not take more than prescribed Pain, Sleep and Anxiety Medications  Special Instructions: If you have smoked or chewed Tobacco  in the last 2 yrs please stop smoking, stop any regular Alcohol  and or any Recreational drug use.  Wear Seat belts while driving.   Please note  You were cared for by a hospitalist during your hospital stay. If you have any questions about your discharge medications or the care you received while you were in the hospital after you are discharged, you can call the unit and asked to speak with the hospitalist on call if the hospitalist that took care of you is not available. Once you are discharged, your primary care physician will handle any further medical issues. Please note that NO REFILLS for any discharge medications will be authorized once you are discharged, as it is imperative that you return to your primary care physician (or establish a relationship with a primary care physician if you do not have one) for your aftercare needs so that they can reassess your need for medications and monitor your lab values.   Increase activity slowly   Complete by: As directed       Allergies as of 08/22/2024   No Known Allergies      Medication List     STOP taking these medications    aspirin EC 325 MG tablet   naproxen  500 MG tablet Commonly known as: NAPROSYN    ondansetron  4 MG disintegrating tablet Commonly known as: ZOFRAN -ODT       TAKE these medications    amoxicillin -clavulanate 875-125 MG tablet Commonly known as: AUGMENTIN  Take 1 tablet by mouth 2 (two) times daily for 10 days.    cyanocobalamin  1000 MCG tablet Commonly known as: VITAMIN B12 Take 1 tablet (1,000 mcg total) by mouth daily.   ferrous sulfate  325 (65 FE) MG tablet Take 1 tablet (325 mg total) by mouth 2 (two) times daily with a meal.        Follow-up Information     Rosalva Sawyer, MD .   Specialty: Obstetrics and Gynecology Why: For women care follow-up Contact information: [address] [place] [address] [phone]         [facility] Follow up on 10/31/2024.   Specialty: Internal Medicine Why: Your appointment is at 8:40 am. Please arrive early and bring a picture ID and your current medications. Contact information: [address] [place]  [address] [phone] Additional information: [address] [place], [place] [address]        Carolee Sherwood JONETTA DOUGLAS, MD Follow up.   Specialty: Urology Why: you will be called by [facility] to schedule an appointment. Contact information: [address]  [place] [address] [phone]                No Known Allergies  Consultations: urology Dr. Carolee   Procedures/Studies: CT ABDOMEN PELVIS W CONTRAST Result Date: 08/19/2024 CLINICAL DATA:  Acute abdominal pain.  Low back pain. EXAM: CT ABDOMEN AND PELVIS WITH CONTRAST TECHNIQUE: Multidetector CT imaging of the abdomen and pelvis was performed using the standard protocol following bolus administration of intravenous contrast. RADIATION DOSE REDUCTION: This exam was performed according to the departmental dose-optimization program which includes automated exposure control, adjustment of the mA and/or kV according to patient size and/or use of iterative reconstruction technique. CONTRAST:  75mL OMNIPAQUE  IOHEXOL  350 MG/ML SOLN COMPARISON:  CT abdomen and pelvis 11/24/2019 FINDINGS: Lower chest: No acute abnormality. Hepatobiliary: No focal liver abnormality is seen. No  gallstones, gallbladder wall thickening, or biliary dilatation. Pancreas: Unremarkable. No pancreatic ductal dilatation or surrounding inflammatory changes. Spleen: Normal in size without focal abnormality. Adrenals/Urinary Tract: The adrenal glands, left kidney and bladder are within normal limits. Staghorn type calculus in the right kidney is again seen with additional scattered right renal calculi measuring up to 9 mm. There are multiple areas of cortical scarring. There is right perinephric fat stranding. There is abnormal patchy enhancement pattern throughout the superior pole the right kidney. There some cystic areas in the inferior pole which may represent dilated calices or cysts. There is no hydronephrosis. There is no significant excretion of contrast from the right kidney. Stomach/Bowel: Stomach is within normal limits. Appendix is not seen. There is sigmoid colon diverticulosis. No evidence of bowel wall thickening, distention, or inflammatory changes. Vascular/Lymphatic: There are prominent retroperitoneal lymph nodes measuring up to 9 mm at the level of the kidneys. There is a stable nodular density measuring 7 mm in the right retroperitoneal region inferior to the right kidney image 3/49. Aorta and IVC are normal in size. There are atherosclerotic calcifications of the aorta. Reproductive: Uterus and bilateral adnexa are unremarkable. Other: No abdominal wall hernia or abnormality. No abdominopelvic ascites. Musculoskeletal: No fracture is seen. IMPRESSION: 1. Staghorncalculus in the right kidney with additional scattered right renal calculi. Right perinephric fat stranding and abnormal patchy enhancement pattern in the superior pole of the right kidney. There are cystic areas in the inferior pole which may be related to dilated calices. 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 Underlying renal neoplasm not excluded. 2. Prominent retroperitoneal lymph nodes are  likely reactive. 3. Sigmoid colon diverticulosis. 4. Aortic atherosclerosis. Aortic Atherosclerosis (ICD10-I70.0). Electronically Signed   By: Greig Pique M.D.   On: 08/19/2024 01:05      Subjective: No significant events overnight, she denies any complaints  Discharge Exam: Vitals:   08/22/24 0000 08/22/24 0802  BP: 114/74 107/73  Pulse: 64 68  Resp: 17 14  Temp: 97.6 F (36.4 C) 98.4 F (36.9 C)  SpO2: 97% 100%   Vitals:   08/21/24 0828 08/21/24 2010 08/22/24 0000 08/22/24 0802  BP: 109/62 (!) 145/83 114/74 107/73  Pulse: 78 89 64 68  Resp: 16 18 17 14   Temp: 97.9 F (36.6 C) 98.5 F (36.9 C) 97.6 F (36.4 C) 98.4 F (36.9 C)  TempSrc: Oral Oral Oral Oral  SpO2: 98% 99% 97% 100%  Weight:      Height:        General: Pt is alert, awake, not in acute distress Cardiovascular: RRR, S1/S2 +, no rubs, no gallops Respiratory: CTA bilaterally, no wheezing, no rhonchi Abdominal: Soft, NT, ND, bowel sounds +  Extremities: no edema, no cyanosis    The results of significant diagnostics from this hospitalization (including imaging, microbiology, ancillary and laboratory) are listed below for reference.     Microbiology: Recent Results (from the past 240 hours)  Urine Culture     Status: Abnormal   Collection Time: 08/18/24  9:20 PM   Specimen: Urine, Clean Catch  Result Value Ref Range Status   Specimen Description URINE, CLEAN CATCH  Final   Special Requests NONE  Final   Culture (A)  Final    <10,000 COLONIES/mL INSIGNIFICANT GROWTH Performed at [facility], [address], [place], [place] [address]    Report Status 08/19/2024 FINAL  Final     Labs: BNP (last 3 results) No results for input(s): BNP in the last 8760 hours. Basic Metabolic Panel: Recent Labs  Lab 08/18/24 1539 08/18/24 1547 08/20/24 0628 08/21/24 0519  NA 138 139 136 137  K 3.4* 3.4* 4.1 4.0  CL 106 107 108 107  CO2 20*  --  20* 20*  GLUCOSE 141* 147* 93 101*  BUN 8 9 7 8    CREATININE 0.86 0.80 0.82 0.76  CALCIUM  9.0  --  8.3* 8.6*  MG 1.8  --  1.8  --    Liver Function Tests: Recent Labs  Lab 08/18/24 1539 08/20/24 0628  AST 14* 15  ALT 8 8  ALKPHOS 80 65  BILITOT 0.3 0.3  PROT 8.1 6.8  ALBUMIN 3.5 2.8*   Recent Labs  Lab 08/18/24 1539  LIPASE 38   No results for input(s): AMMONIA in the last 168 hours. CBC: Recent Labs  Lab 08/18/24 1539 08/18/24 1547 08/20/24 0628 08/21/24 0519  WBC 8.9  --  10.3 8.9  NEUTROABS 5.0  --   --   --   HGB 11.0* 12.9 9.2* 9.4*  HCT 38.0 38.0 30.8* 31.5*  MCV 75.4*  --  73.3* 72.6*  PLT 477*  --  361 398   Cardiac Enzymes: No results for input(s): CKTOTAL, CKMB, CKMBINDEX, TROPONINI in the last 168 hours. BNP: Invalid input(s): POCBNP CBG: No results for input(s): GLUCAP in the last 168 hours. D-Dimer No results for input(s): DDIMER in the last 72 hours. Hgb A1c No results for input(s): HGBA1C in the last 72 hours. Lipid Profile No results for input(s): CHOL, HDL, LDLCALC, TRIG, CHOLHDL, LDLDIRECT in the last 72 hours. Thyroid function studies No results for input(s): TSH, T4TOTAL, T3FREE, THYROIDAB in the last 72 hours.  Invalid input(s): FREET3 Anemia work up Recent Labs    08/20/24 0628  VITAMINB12 302  FOLATE 12.2  FERRITIN 10*  TIBC 379  IRON 23*  RETICCTPCT 0.9   Urinalysis    Component Value Date/Time   COLORURINE STRAW (A) 08/18/2024 1530   APPEARANCEUR CLEAR 08/18/2024 1530   LABSPEC 1.005 08/18/2024 1530   PHURINE 7.0 08/18/2024 1530   GLUCOSEU NEGATIVE 08/18/2024 1530   HGBUR SMALL (A) 08/18/2024 1530   BILIRUBINUR NEGATIVE 08/18/2024 1530   BILIRUBINUR moderate (A) 02/11/2022 1724   BILIRUBINUR neg 03/18/2017 1616   KETONESUR NEGATIVE 08/18/2024 1530   PROTEINUR NEGATIVE 08/18/2024 1530   UROBILINOGEN 0.2 02/11/2022 1724   UROBILINOGEN 0.2 08/07/2015 2042   NITRITE NEGATIVE 08/18/2024 1530   LEUKOCYTESUR LARGE (A)  08/18/2024 1530   Sepsis Labs Recent Labs  Lab 08/18/24 1539 08/20/24 0628 08/21/24 0519  WBC 8.9 10.3 8.9   Microbiology Recent Results (from the past 240 hours)  Urine Culture     Status: Abnormal  Collection Time: 08/18/24  9:20 PM   Specimen: Urine, Clean Catch  Result Value Ref Range Status   Specimen Description URINE, CLEAN CATCH  Final   Special Requests NONE  Final   Culture (A)  Final    <10,000 COLONIES/mL INSIGNIFICANT GROWTH Performed at [facility], [address] [place], [place] [address]    Report Status 08/19/2024 FINAL  Final     Time coordinating discharge: Over 30 minutes  SIGNED:   Brayton Lye, MD  [facility] 08/22/2024, 1:48 PM Pager   If 7PM-7AM, please contact night-coverage [email or web address]

## 2024-08-22 NOTE — Plan of Care (Signed)

## 2024-08-22 NOTE — Progress Notes (Addendum)
 Discharge Nurse Summary: DC order noted per MD. DC RN at bedside with patient. Patient agreeable with discharge plan, family at the bedside. AVS printed/reviewed. PIV removed, skin intact. No DME needs. No home meds. TOC meds delivered to the patient. CP/Edu resolved. Telemonitor returned to charging station. All belongings accounted for. Patient wheeled downstairs for discharge by private auto.   Rosario EMERSON Lund, RN

## 2024-08-22 NOTE — TOC Transition Note (Signed)
 Transition of Care [facility]) - Discharge Note   Patient Details  Name: [name] MRN: [id number] Date of Birth: 18-Mar-1981  [facility]) CM/SW Contact:  Andrez JULIANNA George, RN Phone Number: 08/22/2024, 3:38 PM   Clinical Narrative:     Pt is discharging home with self care. She has no health insurance and no PCP. PCP appt added to AVS.  Pt's family states they can afford the $15 co pay for her medications.  Pt has transportation home.  Final next level of care: Home/Self Care Barriers to Discharge: Inadequate or no insurance, Barriers Unresolved (comment)   Patient Goals and CMS Choice            Discharge Placement                       Discharge Plan and Services Additional resources added to the After Visit Summary for                                       Social Drivers of Health (SDOH) Interventions SDOH Screenings   Food Insecurity: No Food Insecurity (08/19/2024)  Housing: Low Risk  (08/19/2024)  Transportation Needs: No Transportation Needs (08/19/2024)  Utilities: Not At Risk (08/19/2024)  Depression (PHQ2-9): Low Risk  (10/02/2020)  Tobacco Use: Low Risk  (08/18/2024)     Readmission Risk Interventions     No data to display

## 2024-08-22 NOTE — Discharge Instructions (Signed)
 Follow with Primary MD   Get CBC, CMP, checked  by Primary MD next visit.    Disposition Home    Diet: Regular Diet   On your next visit with your primary care physician please Get Medicines reviewed and adjusted.   Please request your Prim.MD to go over all Hospital Tests and Procedure/Radiological results at the follow up, please get all Hospital records sent to your Prim MD by signing hospital release before you go home.   If you experience worsening of your admission symptoms, develop shortness of breath, life threatening emergency, suicidal or homicidal thoughts you must seek medical attention immediately by calling 911 or calling your MD immediately  if symptoms less severe.  You Must read complete instructions/literature along with all the possible adverse reactions/side effects for all the Medicines you take and that have been prescribed to you. Take any new Medicines after you have completely understood and accpet all the possible adverse reactions/side effects.   Do not drive, operating heavy machinery, perform activities at heights, swimming or participation in water activities or provide baby sitting services if your were admitted for syncope or siezures until you have seen by Primary MD or a Neurologist and advised to do so again.  Do not drive when taking Pain medications.    Do not take more than prescribed Pain, Sleep and Anxiety Medications  Special Instructions: If you have smoked or chewed Tobacco  in the last 2 yrs please stop smoking, stop any regular Alcohol  and or any Recreational drug use.  Wear Seat belts while driving.   Please note  You were cared for by a hospitalist during your hospital stay. If you have any questions about your discharge medications or the care you received while you were in the hospital after you are discharged, you can call the unit and asked to speak with the hospitalist on call if the hospitalist that took care of you is not  available. Once you are discharged, your primary care physician will handle any further medical issues. Please note that NO REFILLS for any discharge medications will be authorized once you are discharged, as it is imperative that you return to your primary care physician (or establish a relationship with a primary care physician if you do not have one) for your aftercare needs so that they can reassess your need for medications and monitor your lab values.

## 2024-08-29 ENCOUNTER — Ambulatory Visit (INDEPENDENT_AMBULATORY_CARE_PROVIDER_SITE_OTHER): Payer: Self-pay | Admitting: Internal Medicine

## 2024-08-29 ENCOUNTER — Telehealth: Payer: Self-pay

## 2024-08-29 ENCOUNTER — Other Ambulatory Visit: Payer: Self-pay

## 2024-08-29 ENCOUNTER — Encounter: Payer: Self-pay | Admitting: Internal Medicine

## 2024-08-29 ENCOUNTER — Other Ambulatory Visit: Payer: Self-pay | Admitting: Internal Medicine

## 2024-08-29 VITALS — Ht 61.0 in

## 2024-08-29 DIAGNOSIS — R110 Nausea: Secondary | ICD-10-CM

## 2024-08-29 DIAGNOSIS — N938 Other specified abnormal uterine and vaginal bleeding: Secondary | ICD-10-CM

## 2024-08-29 DIAGNOSIS — K5790 Diverticulosis of intestine, part unspecified, without perforation or abscess without bleeding: Secondary | ICD-10-CM

## 2024-08-29 DIAGNOSIS — N200 Calculus of kidney: Secondary | ICD-10-CM

## 2024-08-29 DIAGNOSIS — E611 Iron deficiency: Secondary | ICD-10-CM

## 2024-08-29 DIAGNOSIS — E119 Type 2 diabetes mellitus without complications: Secondary | ICD-10-CM

## 2024-08-29 DIAGNOSIS — I700 Atherosclerosis of aorta: Secondary | ICD-10-CM

## 2024-08-29 DIAGNOSIS — N10 Acute tubulo-interstitial nephritis: Secondary | ICD-10-CM

## 2024-08-29 DIAGNOSIS — G4489 Other headache syndrome: Secondary | ICD-10-CM

## 2024-08-29 DIAGNOSIS — E538 Deficiency of other specified B group vitamins: Secondary | ICD-10-CM

## 2024-08-29 LAB — COMPREHENSIVE METABOLIC PANEL WITH GFR
ALT: 10 U/L (ref 0–35)
AST: 14 U/L (ref 0–37)
Albumin: 4.3 g/dL (ref 3.5–5.2)
Alkaline Phosphatase: 80 U/L (ref 39–117)
BUN: 14 mg/dL (ref 6–23)
CO2: 24 meq/L (ref 19–32)
Calcium: 9.6 mg/dL (ref 8.4–10.5)
Chloride: 103 meq/L (ref 96–112)
Creatinine, Ser: 0.71 mg/dL (ref 0.40–1.20)
GFR: 104.81 mL/min (ref >60.00)
Glucose, Bld: 105 mg/dL — ABNORMAL HIGH (ref 70–99)
Potassium: 3.9 meq/L (ref 3.5–5.1)
Sodium: 136 meq/L (ref 135–145)
Total Bilirubin: 0.3 mg/dL (ref 0.2–1.2)
Total Protein: 8.5 g/dL — ABNORMAL HIGH (ref 6.0–8.3)

## 2024-08-29 LAB — CBC WITH DIFFERENTIAL/PLATELET
Basophils Absolute: 0.1 K/uL (ref 0.0–0.1)
Basophils Relative: 0.7 % (ref 0.0–3.0)
Eosinophils Absolute: 0.1 K/uL (ref 0.0–0.7)
Eosinophils Relative: 1.4 % (ref 0.0–5.0)
HCT: 34.4 % — ABNORMAL LOW (ref 36.0–46.0)
Hemoglobin: 10.7 g/dL — ABNORMAL LOW (ref 12.0–15.0)
Lymphocytes Relative: 37.5 % (ref 12.0–46.0)
Lymphs Abs: 2.9 K/uL (ref 0.7–4.0)
MCHC: 31 g/dL (ref 30.0–36.0)
MCV: 70.3 fl — ABNORMAL LOW (ref 78.0–100.0)
Monocytes Absolute: 0.4 K/uL (ref 0.1–1.0)
Monocytes Relative: 5.7 % (ref 3.0–12.0)
Neutro Abs: 4.2 K/uL (ref 1.4–7.7)
Neutrophils Relative %: 54.7 % (ref 43.0–77.0)
Platelets: 468 K/uL — ABNORMAL HIGH (ref 150.0–400.0)
RBC: 4.9 Mil/uL (ref 3.87–5.11)
RDW: 18 % — ABNORMAL HIGH (ref 11.5–15.5)
WBC: 7.7 K/uL (ref 4.0–10.5)

## 2024-08-29 LAB — B12 AND FOLATE PANEL
Folate: 10.9 ng/mL (ref >5.9)
Vitamin B-12: 883 pg/mL (ref 211–911)

## 2024-08-29 LAB — LIPID PANEL
Cholesterol: 179 mg/dL (ref 0–200)
HDL: 47.2 mg/dL (ref >39.00)
LDL Cholesterol: 110 mg/dL — ABNORMAL HIGH (ref 0–99)
NonHDL: 131.98
Total CHOL/HDL Ratio: 4
Triglycerides: 112 mg/dL (ref 0.0–149.0)
VLDL: 22.4 mg/dL (ref 0.0–40.0)

## 2024-08-29 MED ORDER — KETOROLAC TROMETHAMINE 60 MG/2ML IM SOLN: 60.0000 mg | Freq: Once | INTRAMUSCULAR | Status: DC

## 2024-08-29 MED ORDER — AMOXICILLIN-POT CLAVULANATE 875-125 MG PO TABS
1.0000 | ORAL_TABLET | Freq: Two times a day (BID) | ORAL | 0 refills | Status: DC
  Filled 2024-08-29 (×2): qty 14, 7d supply, fill #0

## 2024-08-29 MED ORDER — SUMATRIPTAN SUCCINATE 50 MG PO TABS
50.0000 mg | ORAL_TABLET | Freq: Every day | ORAL | 5 refills | Status: AC | PRN
  Filled 2024-08-29: qty 10, 10d supply, fill #0

## 2024-08-29 MED ORDER — KETOROLAC TROMETHAMINE 60 MG/2ML IM SOLN
60.0000 mg | Freq: Once | INTRAMUSCULAR | Status: AC
  Administered 2024-08-29: 60 mg via INTRAMUSCULAR

## 2024-08-29 MED ORDER — SUMATRIPTAN SUCCINATE 50 MG PO TABS: 50.0000 mg | ORAL_TABLET | Freq: Every day | ORAL | 5 refills | Status: DC | PRN

## 2024-08-29 MED ORDER — ONDANSETRON 4 MG PO TBDP: 4.0000 mg | ORAL_TABLET | Freq: Three times a day (TID) | ORAL | 2 refills | Status: DC | PRN

## 2024-08-29 MED ORDER — ONDANSETRON 4 MG PO TBDP
4.0000 mg | ORAL_TABLET | Freq: Three times a day (TID) | ORAL | 2 refills | Status: AC | PRN
  Filled 2024-08-29: qty 20, 7d supply, fill #0

## 2024-08-29 MED ORDER — IBUPROFEN 800 MG PO TABS: 800.0000 mg | ORAL_TABLET | Freq: Three times a day (TID) | ORAL | 0 refills | Status: AC | PRN

## 2024-08-29 MED ORDER — AMOXICILLIN-POT CLAVULANATE 875-125 MG PO TABS: 1.0000 | ORAL_TABLET | Freq: Two times a day (BID) | ORAL | 0 refills | Status: DC

## 2024-08-29 MED ORDER — NURTEC 75 MG PO TBDP
1.0000 | ORAL_TABLET | Freq: Every day | ORAL | 1 refills | Status: AC
  Filled 2024-08-29 – 2024-08-30 (×3): qty 15, 15d supply, fill #0

## 2024-08-29 MED ORDER — NURTEC 75 MG PO TBDP: 1.0000 | ORAL_TABLET | Freq: Every day | ORAL | 1 refills | Status: DC

## 2024-08-29 NOTE — Assessment & Plan Note (Signed)
 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 Asymptomatic; no acute intervention needed. Counsel on symptoms of diverticulitis (fever, abdominal pain, changes in bowel habits).

## 2024-08-29 NOTE — Assessment & Plan Note (Signed)
 Aortic atherosclerosis (I70.0): Imaging shows aortic calcifications, risk factor for cardiovascular disease. Continue monitoring cardiovascular risk factors (blood pressure, lipids, glycemic control). Encourage heart-healthy diet and regular exercise.

## 2024-08-29 NOTE — Patient Instructions (Addendum)
 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 Se realiz una referencia urgente (STAT) a urologa para evaluacin y planificacin de ciruga, probablemente para retirar el rin derecho daado, ya que tiene funcin muy baja. Se solicit anlisis de comoros y cultivo para Development worker, community la infeccin y Academic librarian el tratamiento si es necesario. Manejo del dolor de cabeza: Seguir tomando Imitrex  y Nurtec segn lo necesite para el dolor de cabeza. No se requieren analgsicos adicionales para el dolor de espalda o abdominal en este momento. Enfermedad diverticular: No requiere tratamiento en este momento, pero debe estar atenta a sntomas como dolor abdominal, fiebre o cambios en el ritmo intestinal. Aterosclerosis artica: Mantener control de presin arterial, colesterol y azcar en sangre. Seguir una dieta saludable para el corazn y Education officer, environmental actividad fsica regular. Sangrado uterino disfuncional y anemia: Continuar con los suplementos de hierro y vitamina B12. Mantener el seguimiento con ginecologa. Diabetes tipo 2: Controlar los niveles de glucosa y Educational psychologist seguimiento con su mdico de cabecera o Futures trader. Nuseas: Vigilar los sntomas. Si las nuseas empeoran, Science writer para considerar tratamiento adicional. Laboratorios: Se recomienda controlar hemograma, funcin renal y electrolitos segn indicacin mdica. Educacin y advertencias: Si presenta  fiebre, escalofros, dolor intenso, vmitos, confusin o cualquier sntoma nuevo, debe acudir a urgencias de inmediato. Se le explic que el rin izquierdo est sano y puede encargarse de la funcin renal si se retira el derecho. Seguimiento: Esperar contacto de urologa para cita urgente. Revisar resultados de [place] de comoros. Mantener seguimiento con su mdico de cabecera y Print production planner. Contacto: Si tiene preguntas o sntomas nuevos, comunquese con nuestro consultorio.   VISIT SUMMARY: Today, you had your annual physical exam. We discussed your current health status, including your hypertension, history of stroke, and overall wellness. You have been managing your health well through diet and exercise, and we reviewed your medications and made some adjustments. We also discussed the need for further evaluations and routine health maintenance.  YOUR PLAN: -HYPERTENSION: Hypertension means high blood pressure. Your blood pressure is still high, especially in the mornings. We are adding a new medication, chlorthalidone, and stopping Imdur. Please monitor your blood pressure at home and report your readings. We will follow up in one month to see how you are doing. We may also consider a sleep study to check for sleep apnea, which could be affecting your blood pressure.  -SUSPECTED SLEEP APNEA: Sleep apnea is a condition where you stop breathing for short periods while sleeping. You have symptoms like snoring, which suggest you might have sleep apnea. We will order a sleep study to evaluate this further.  -TYPE 2 DIABETES MELLITUS: Type 2 diabetes is a condition where your body does not use insulin  properly. Your diabetes is managed with metformin , and your A1c level is close to the target. We will order tests to check your kidney function and ensure you have regular eye exams. We will also continue to monitor your A1c levels.  -CEREBROVASCULAR ACCIDENT WITH RESIDUAL RIGHT-SIDED NUMBNESS: A  cerebrovascular accident, or stroke, occurs when blood flow to a part of your brain is interrupted. You have some numbness  on your right side from your previous stroke. Continue your physical activities and exercises to improve your strength and mobility.  -OBESITY: Obesity means having excess body fat. You have lost weight and are now more active, which is great. Keep up with your physical activity and healthy diet. We will continue to monitor your weight and waist circumference.  -ADULT WELLNESS VISIT: This visit was your annual physical exam. You have improved your mobility and lost weight. You are following a healthy diet and staying active. We performed a comprehensive physical examination and ordered several routine tests to check your overall health.  -GENERAL HEALTH MAINTENANCE: Maintaining your general health includes regular screenings and vaccinations. We discussed the need for a colon cancer screening with Cologuard, scheduling a mammogram, and getting pneumonia and hepatitis B vaccinations. Make sure to have regular dental cleanings every six months.  INSTRUCTIONS: Please monitor your blood pressure at home and report your readings. Schedule a follow-up visit in one month to evaluate your blood pressure. We will also order a sleep study to check for sleep apnea. Continue with your current diabetes management plan and ensure regular eye exams. We will order tests to check your kidney function and monitor your A1c levels. Keep up with your physical activities and healthy diet. Schedule a colon cancer [place] reening with Cologuard, a mammogram, and get your pneumonia and hepatitis B vaccinations. Remember to have regular dental cleanings every six months.  Welcome aboard!   Today's visit was a valuable first step in understanding your health and starting your personalized care journey. We discussed your medical history and medications in detail. Given the extensive information, we prioritized  addressing your most pressing concerns.  We understood those concerns to be:  New pt (Pt is present to est care with provider) and Hospitalization Follow-up (Was in hospital for kidney stones pt is having headaches now as well and is concerns about that )   Building a Complete Picture  To create the most effective care plan possible, we may need additional information from previous providers. We encouraged you to gather any relevant medical records for your next visit. This will help us  build a more complete picture and develop a personalized plan together. In the meantime, we'll address your immediate concerns and provide resources to help you manage all of your medical issues.  We encourage you to use MyChart to review these efforts, and to help us  find and correct any omissions or errors in your medical chart.  Managing Your Health Over Time  Managing every aspect of your health in a single visit isn't always feasible, but that's okay.  We addressed your most pressing concerns today and charted a course for future care. Acute conditions or preventive care measures may require further attention.  We encourage you to schedule a follow-up visit at your earliest convenience to discuss any unresolved issues.  We strongly encourage participation in annual preventive care visits to help us  develop a more thorough understanding of your health and to help you maintain optimal wellness - please inquire about scheduling your next one with us  at your earliest convenience.  Your Satisfaction Matters  It was a pleasure seeing you today!  Your health and satisfaction will always be my top priorities. If you believe your experience today was worthy of a 5-star rating, I'd be grateful for your feedback!  Bernardino KANDICE Cone, MD   Next Steps  Schedule Follow-Up:  We recommend a follow-up appointment in No follow-ups on file. If your  condition worsens before then, please call us  or seek emergency care. Preventive  Care:  Don't forget to schedule your annual preventive care visit!  This important checkup is typically covered by insurance and helps identify potential health issues early.  Typically its 100% insurance covered with no co-pay and helps to get surveillance labwork paid for through your insurance provider.  Sometimes it even lowers your insurance premiums to participate. Medical Information Release:  For any relevant medical information we don't have, please sign a release form so we can obtain it for your records. Lab & X-ray Appointments:  Scheduled any incomplete lab tests today or call us  to schedule.  X-Rays can be done without an appointment at [facility] at [facility] ([address]), M-F 8:30am-noon or 1pm-5pm.  Just tell them you're there for X-rays ordered by Dr. Jesus.  We'll receive the results and contact you by phone or MyChart to discuss next steps.  Bring to Your Next Appointment  Medications: Please bring all your medication bottles to your next appointment to ensure we have an accurate record of your prescriptions. Health Diaries: If you're monitoring any health conditions at home, keeping a diary of your readings can be very helpful for discussions at your next appointment.  Reviewing Your Records  Please Review this early draft of your clinical notes below and the final encounter summary tomorrow on MyChart after its been completed.   Staghorn calculus -     Ambulatory referral to Urology  Diverticular disease  Aortic atherosclerosis (HCC) -     Lipid panel  Headache syndrome -     TSH Rfx on Abnormal to Free T4 -     Ketorolac  Tromethamine  -     Ibuprofen ; Take 1 tablet (800 mg total) by mouth every 8 (eight) hours as needed.  Dispense: 90 tablet; Refill: 0  Acute pyelonephritis -     Urinalysis w microscopic + reflex cultur; Future -     Ambulatory referral to Urology -     Comprehensive metabolic panel with GFR -     CBC with  Differential/Platelet  DUB (dysfunctional uterine bleeding) -     TSH Rfx on Abnormal to Free T4  Type 2 diabetes mellitus without complication, unspecified whether long term insulin  use (HCC) -     Hemoglobin A1c  Nausea -     TSH Rfx on Abnormal to Free T4  Iron deficiency -     Iron, TIBC and Ferritin Panel  B12 deficiency -     B12 and Folate Panel     Getting Answers and Following Up  Simple Questions & Concerns: For quick questions or basic follow-up after your visit, reach us  at [phone] or MyChart messaging. Complex Concerns: If your concern is more complex, scheduling an appointment might be best. Discuss this with the staff to find the most suitable option. Lab & Imaging Results: We'll contact you directly if results are abnormal or you don't use MyChart. Most normal results will be on MyChart within 2-3 business days, with a review message from Dr. Jesus. Haven't heard back in 2 weeks? Need results sooner? Contact us  at [phone]. Referrals: Our referral coordinator will manage specialist referrals. The specialist's office should contact you within 2 weeks to schedule an appointment. Call us  if you haven't heard from them after 2 weeks.  Staying Connected  MyChart: Activate your MyChart for the fastest way to access results and message us . See the last page of this  paperwork for instructions.  Billing  X-ray & Lab Orders: These are billed by separate companies. Contact the invoicing company directly for questions or concerns. Visit Charges: Discuss any billing inquiries with our administrative services team.  Feedback & Satisfaction  Share Your Experience: We strive for your satisfaction! If you have any complaints, please let Dr. Jesus know directly or contact our Practice Administrators, Manuelita Rubin or [employer], by asking at the front desk.  Scheduling Tips  Shorter Wait Times: 8 am and 1 pm appointments often have the quickest wait  times. Longer Appointments: If you need more time during your visit, talk to the front desk. Due to insurance regulations, multiple back-to-back appointments might be necessary.

## 2024-08-29 NOTE — Assessment & Plan Note (Signed)
 Lab Results  Component Value Date   HGBA1C 6.0 (A) 10/02/2020  Will order lab testing to guide management.   Type 2 diabetes mellitus without complication (E11.9): Chronic condition, currently without documented complications. Nausea (R11.0): Intermittent, likely related to renal infection; currently no vomiting.

## 2024-08-29 NOTE — Assessment & Plan Note (Signed)
 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 Imaging also shows cortical scarring and perinephric fat stranding.   Right staghorn calculus & pyelonephritis:  Continue Augmentin  for 7 additional days to complete a 14-day course. STAT referral to urology for urgent evaluation and surgical planning, likely nephrectomy given severely reduced right renal function and ongoing infection. Urinalysis and urine culture ordered today to assess for persistent infection and antibiotic sensitivity.  Acute pyelonephritis (N10): Ongoing infection of right kidney with persistent pain and incomplete response to antibiotics. History and imaging suggest xanthogranulomatous pyelonephritis.  Will extend antibiotic(s) while arranging urology follow up. Explained I/D consult explaining long term antibiotic(s) are not recommended due to resistance risks.

## 2024-08-29 NOTE — Assessment & Plan Note (Signed)
 Headache syndrome (G44.89): Ongoing headaches, will try to manage with Imitrex  and Nurtec, Toradol  shot in clinic

## 2024-08-29 NOTE — Telephone Encounter (Signed)
 Copied from CRM [id number]. Topic: Clinical - Prescription Issue >> Aug 29, 2024  2:26 PM Fonda T wrote: Reason for CRM:  Spanish Interpreter assisted call, Therisa ID# [id number]  Patient calling requesting all medications prescribed today can be sent to [facility] instead of [facility], as out of pocket cost for medications are too expensive at [facility].   Medications:  amoxicillin -clavulanate (AUGMENTIN ) 875-125 MG tablet ondansetron  (ZOFRAN -ODT) 4 MG disintegrating tablet Rimegepant Sulfate  (NURTEC) 75 MG TBDP SUMAtriptan  (IMITREX ) 50 MG tablet   Preferred Pharmacy:   [facility] [address] [place] [address] Phone: [phone] Fax: [phone]  Patient can be reached at [phone] if need to discuss further.

## 2024-08-29 NOTE — Progress Notes (Signed)
 [facility]  Phone: [phone]  - Medical Office Visit -  Visit Date: 08/29/2024 Patient: [name]   DOB: 06/29/1981   43 y.o. Female  MRN: [id number] Patient Care Team: Jesus Bernardino MATSU, MD as PCP - General (Internal Medicine) Today's Health Care Provider: Bernardino MATSU Jesus, MD  ===========================================    Chief Complaint / Reason for Visit: New pt (Pt is present to est care with provider) and Hospitalization Follow-up (Was in hospital for kidney stones pt is having headaches now as well and is concerns about that )  Interpreter used, live and reviewed records in spanish (translated)  History of Present Illness (HPI): 43 year old female with a history of right staghorn renal calculus and chronic pyelonephritis, as well as recurrent urinary tract infections in the past. For approximately the past 3 weeks, she has experienced persistent pain in the lower back and pelvic region, without fever or chills. She has had occasional nausea and vomiting but denies hematuria or respiratory symptoms. She is currently on day 8 of Augmentin  therapy but continues to have pain in the lower back and pelvic area. She has not been able to reach urology for follow-up. Additionally, she reports headache, for which Imitrex  and Nurtec were prescribed. 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 The left kidney demonstrates normal function. She has been urgently referred to urology for evaluation and definitive management, likely nephrectomy. Urinalysis has been ordered, and she remains under gynecology follow-up for anemia and heavy menstrual bleeding. She continues iron and B12 supplementation. She denies fever, chills, significant dysuria, chest pain, or shortness of breath. No other systemic symptoms reported.  Discussed the use of AI  scribe software for clinical note transcription with the patient, who gave verbal consent to proceed.   Background: 43 y.o. female who has DUB (dysfunctional uterine bleeding); Uterine fibroid; Hydronephrosis; Hypokalemia; Nausea and vomiting; Hyperglycemia; DM type 2 (diabetes mellitus, type 2) (HCC); Headache syndrome; Pyelonephritis; Lymphadenopathy; Acute pyelonephritis; Microcytic anemia; Staghorn calculus; Aortic atherosclerosis (HCC); and Diverticular disease on their problem list.  Problem overviews updated today: Problem  Staghorn Calculus  Aortic Atherosclerosis (Hcc)  Diverticular Disease  DM Type 2 (Diabetes Mellitus, Type 2) (Hcc)   Lab Results  Component Value Date   HGBA1C 6.0 (A) 10/02/2020   HGBA1C 5.8 (A) 11/19/2018   HGBA1C 6.5 (A) 08/23/2018       Pyelonephritis    Medications updated/reviewed: No current outpatient medications on file prior to visit.   No current facility-administered medications on file prior to visit.   Medications Discontinued During This Encounter  Medication Reason   ferrous sulfate  325 (65 FE) MG tablet    cyanocobalamin  (VITAMIN B12) 1000 MCG tablet    amoxicillin -clavulanate (AUGMENTIN ) 875-125 MG tablet Reorder   ketorolac  (TORADOL ) injection 60 mg    Current Meds  Medication Sig   ibuprofen  (ADVIL ) 800 MG tablet Take 1 tablet (800 mg total) by mouth every 8 (eight) hours as needed.   [DISCONTINUED] amoxicillin -clavulanate (AUGMENTIN ) 875-125 MG tablet Take 1 tablet by mouth 2 (two) times daily for 10 days.   [DISCONTINUED] amoxicillin -clavulanate (AUGMENTIN ) 875-125 MG tablet Take 1 tablet by mouth 2 (two) times daily.   [DISCONTINUED] ondansetron  (ZOFRAN -ODT) 4 MG disintegrating tablet Take 1 tablet (4 mg total) by mouth every 8 (eight) hours as needed for nausea or vomiting.   [DISCONTINUED] Rimegepant Sulfate  (NURTEC) 75 MG TBDP Take 1 tablet (75 mg total) by mouth daily  at 6 (six) AM.   [DISCONTINUED] SUMAtriptan  (IMITREX )  50 MG tablet Take 1 tablet (50 mg total) by mouth daily as needed for migraine. May repeat in 2 hours if headache persists or recurs.    Allergies:  Patient has no known allergies. Past Medical History:  has a past medical history of Carpal tunnel syndrome on both sides (01/2016), Headache, Headache syndrome (05/26/2019), Non-insulin  dependent type 2 diabetes mellitus (HCC), Pneumonia due to COVID-19 virus, and Pyelonephritis (11/23/2019). Past Surgical History:   has a past surgical history that includes No past surgeries and Bilateral carpal tunnel release (Bilateral, 02/06/2016). Social History:   reports that she has never smoked. She has never used smokeless tobacco. She reports that she does not drink alcohol and does not use drugs. Family History:  family history includes Healthy in her father and mother. Depression Screen and Health Maintenance:    08/29/2024   10:18 AM 10/02/2020   11:19 AM 08/28/2020    2:45 PM 11/29/2018   12:14 PM  PHQ 2/9 Scores  PHQ - 2 Score 2 0 0 0  PHQ- 9 Score 7 0  2   Health Maintenance  Topic Date Due   OPHTHALMOLOGY EXAM  Never done   Pneumococcal Vaccine (1 of 2 - PCV) Never done   Hepatitis B Vaccines 19-59 Average Risk (1 of 3 - 19+ 3-dose series) Never done   DTaP/Tdap/Td (1 - Tdap) 11/13/2004   HPV VACCINES (1 - 3-dose SCDM series) Never done   FOOT EXAM  03/18/2018   HEMOGLOBIN A1C  04/02/2021   Diabetic kidney evaluation - Urine ACR  10/02/2021   Cervical Cancer Screening (HPV/Pap Cotest)  09/16/2023   Influenza Vaccine  Never done   COVID-19 Vaccine (1 - 2024-25 season) Never done   Mammogram  02/05/2025   Diabetic kidney evaluation - eGFR measurement  08/29/2025   Hepatitis C Screening  Completed   HIV Screening  Completed   Meningococcal B Vaccine  Aged Out   Immunization History  Administered Date(s) Administered   MMR 01/11/2010   Td (Adult),5 Lf Tetanus Toxid, Preservative Free 11/12/2004    Objective   Physical ExamHt 5' 1  (1.549 m)   LMP 08/22/2024 (Exact Date) Comment: Pt is on period currently  BMI 24.99 kg/m  Wt Readings from Last 10 Encounters:  08/19/24 132 lb 4.4 oz (60 kg)  02/22/24 130 lb (59 kg)  10/02/20 144 lb 3.2 oz (65.4 kg)  11/29/18 141 lb 9.6 oz (64.2 kg)  11/19/18 142 lb (64.4 kg)  09/15/18 143 lb 12.8 oz (65.2 kg)  08/23/18 146 lb (66.2 kg)  03/22/17 161 lb (73 kg)  03/18/17 161 lb 12.8 oz (73.4 kg)  02/06/16 153 lb (69.4 kg)  Vital signs reviewed.  Nursing notes reviewed. Weight trend reviewed. General Appearance:  Well developed, well nourished, well-groomed, healthy-appearing female with Body mass index is 24.99 kg/m. No acute distress appreciable.   Skin: Clear and well-hydrated. Pulmonary:  Normal work of breathing at rest, no respiratory distress apparent.    Musculoskeletal: She demonstrates smooth and coordinated movements throughout all major joints.All extremities are intact.  Neurological:  Awake, alert, oriented, and engaged.  No obvious focal neurological deficits or cognitive impairments.  Sensorium seems unclouded.  Psychiatric:  Appropriate mood, pleasant and cooperative demeanor, cheerful and engaged during the exam  Reviewed Results & Data  Office Visit on 08/29/2024  Component Date Value   Cholesterol 08/29/2024 179    Triglycerides 08/29/2024 112.0    HDL  08/29/2024 47.20    VLDL 08/29/2024 22.4    LDL Cholesterol 08/29/2024 110 (H)    Total CHOL/HDL Ratio 08/29/2024 4    NonHDL 08/29/2024 131.98    Sodium 08/29/2024 136    Potassium 08/29/2024 3.9    Chloride 08/29/2024 103    CO2 08/29/2024 24    Glucose, Bld 08/29/2024 105 (H)    BUN 08/29/2024 14    Creatinine, Ser 08/29/2024 0.71    Total Bilirubin 08/29/2024 0.3    Alkaline Phosphatase 08/29/2024 80    AST 08/29/2024 14    ALT 08/29/2024 10    Total Protein 08/29/2024 8.5 (H)    Albumin 08/29/2024 4.3    GFR 08/29/2024 104.81    Calcium  08/29/2024 9.6    WBC 08/29/2024 7.7    RBC 08/29/2024  4.90    Hemoglobin 08/29/2024 10.7 (L)    HCT 08/29/2024 34.4 (L)    MCV 08/29/2024 70.3 (L)    MCHC 08/29/2024 31.0    RDW 08/29/2024 18.0 (H)    Platelets 08/29/2024 468.0 (H)    Neutrophils Relative % 08/29/2024 54.7    Lymphocytes Relative 08/29/2024 37.5    Monocytes Relative 08/29/2024 5.7    Eosinophils Relative 08/29/2024 1.4    Basophils Relative 08/29/2024 0.7    Neutro Abs 08/29/2024 4.2    Lymphs Abs 08/29/2024 2.9    Monocytes Absolute 08/29/2024 0.4    Eosinophils Absolute 08/29/2024 0.1    Basophils Absolute 08/29/2024 0.1    Vitamin B-12 08/29/2024 883    Folate 08/29/2024 10.9   Admission on 08/18/2024, Discharged on 08/22/2024  Component Date Value   Preg Test, Ur 08/18/2024 NEGATIVE    Color, Urine 08/18/2024 STRAW (A)    APPearance 08/18/2024 CLEAR    Specific Gravity, Urine 08/18/2024 1.005    pH 08/18/2024 7.0    Glucose, UA 08/18/2024 NEGATIVE    Hgb urine dipstick 08/18/2024 SMALL (A)    Bilirubin Urine 08/18/2024 NEGATIVE    Ketones, ur 08/18/2024 NEGATIVE    Protein, ur 08/18/2024 NEGATIVE    Nitrite 08/18/2024 NEGATIVE    Leukocytes,Ua 08/18/2024 LARGE (A)    RBC / HPF 08/18/2024 0-5    WBC, UA 08/18/2024 >50    Bacteria, UA 08/18/2024 FEW (A)    Squamous Epithelial / HPF 08/18/2024 0-5    WBC Clumps 08/18/2024 PRESENT    WBC 08/18/2024 8.9    RBC 08/18/2024 5.04    Hemoglobin 08/18/2024 11.0 (L)    HCT 08/18/2024 38.0    MCV 08/18/2024 75.4 (L)    MCH 08/18/2024 21.8 (L)    MCHC 08/18/2024 28.9 (L)    RDW 08/18/2024 16.8 (H)    Platelets 08/18/2024 477 (H)    nRBC 08/18/2024 0.0    Neutrophils Relative % 08/18/2024 57    Neutro Abs 08/18/2024 5.0    Lymphocytes Relative 08/18/2024 35    Lymphs Abs 08/18/2024 3.1    Monocytes Relative 08/18/2024 6    Monocytes Absolute 08/18/2024 0.6    Eosinophils Relative 08/18/2024 1    Eosinophils Absolute 08/18/2024 0.1    Basophils Relative 08/18/2024 1    Basophils Absolute 08/18/2024 0.1     Immature Granulocytes 08/18/2024 0    Abs Immature Granulocytes 08/18/2024 0.03    Magnesium  08/18/2024 1.8    Sodium 08/18/2024 138    Potassium 08/18/2024 3.4 (L)    Chloride 08/18/2024 106    CO2 08/18/2024 20 (L)    Glucose, Bld 08/18/2024 141 (H)    BUN 08/18/2024 8  Creatinine, Ser 08/18/2024 0.86    Calcium  08/18/2024 9.0    Total Protein 08/18/2024 8.1    Albumin 08/18/2024 3.5    AST 08/18/2024 14 (L)    ALT 08/18/2024 8    Alkaline Phosphatase 08/18/2024 80    Total Bilirubin 08/18/2024 0.3    GFR, Estimated 08/18/2024 >60    Anion gap 08/18/2024 12    Lipase 08/18/2024 38    Sodium 08/18/2024 139    Potassium 08/18/2024 3.4 (L)    Chloride 08/18/2024 107    BUN 08/18/2024 9    Creatinine, Ser 08/18/2024 0.80    Glucose, Bld 08/18/2024 147 (H)    Calcium , Ion 08/18/2024 1.05 (L)    TCO2 08/18/2024 18 (L)    Hemoglobin 08/18/2024 12.9    HCT 08/18/2024 38.0    Specimen Description 08/18/2024 URINE, CLEAN CATCH    Special Requests 08/18/2024 NONE    Culture 08/18/2024  (A)                    Value:<10,000 COLONIES/mL INSIGNIFICANT GROWTH Performed at [facility], [address], [place], [place] [address]    Report Status 08/18/2024 08/19/2024 FINAL    HIV Screen 4th Generatio* 08/20/2024 Non Reactive    Sodium 08/20/2024 136    Potassium 08/20/2024 4.1    Chloride 08/20/2024 108    CO2 08/20/2024 20 (L)    Glucose, Bld 08/20/2024 93    BUN 08/20/2024 7    Creatinine, Ser 08/20/2024 0.82    Calcium  08/20/2024 8.3 (L)    Total Protein 08/20/2024 6.8    Albumin 08/20/2024 2.8 (L)    AST 08/20/2024 15    ALT 08/20/2024 8    Alkaline Phosphatase 08/20/2024 65    Total Bilirubin 08/20/2024 0.3    GFR, Estimated 08/20/2024 >60    Anion gap 08/20/2024 8    WBC 08/20/2024 10.3    RBC 08/20/2024 4.20    Hemoglobin 08/20/2024 9.2 (L)    HCT 08/20/2024 30.8 (L)    MCV 08/20/2024 73.3 (L)    MCH 08/20/2024 21.9 (L)    MCHC 08/20/2024 29.9 (L)    RDW  08/20/2024 16.8 (H)    Platelets 08/20/2024 361    nRBC 08/20/2024 0.0    Magnesium  08/20/2024 1.8    Vitamin B-12 08/20/2024 302    Folate 08/20/2024 12.2    Iron 08/20/2024 23 (L)    TIBC 08/20/2024 379    Saturation Ratios 08/20/2024 6 (L)    UIBC 08/20/2024 356    Ferritin 08/20/2024 10 (L)    Retic Ct Pct 08/20/2024 0.9    RBC. 08/20/2024 4.24    Retic Count, Absolute 08/20/2024 36.9    Immature Retic Fract 08/20/2024 10.0    WBC 08/21/2024 8.9    RBC 08/21/2024 4.34    Hemoglobin 08/21/2024 9.4 (L)    HCT 08/21/2024 31.5 (L)    MCV 08/21/2024 72.6 (L)    MCH 08/21/2024 21.7 (L)    MCHC 08/21/2024 29.8 (L)    RDW 08/21/2024 16.5 (H)    Platelets 08/21/2024 398    nRBC 08/21/2024 0.0    Sodium 08/21/2024 137    Potassium 08/21/2024 4.0    Chloride 08/21/2024 107    CO2 08/21/2024 20 (L)    Glucose, Bld 08/21/2024 101 (H)    BUN 08/21/2024 8    Creatinine, Ser 08/21/2024 0.76    Calcium  08/21/2024 8.6 (L)    GFR, Estimated 08/21/2024 >60    Anion  gap 08/21/2024 10   Admission on 02/22/2024, Discharged on 02/23/2024  Component Date Value   Sodium 02/22/2024 137    Potassium 02/22/2024 4.0    Chloride 02/22/2024 101    CO2 02/22/2024 23    Glucose, Bld 02/22/2024 198 (H)    BUN 02/22/2024 11    Creatinine, Ser 02/22/2024 0.72    Calcium  02/22/2024 8.6 (L)    GFR, Estimated 02/22/2024 >60    Anion gap 02/22/2024 13    WBC 02/22/2024 10.8 (H)    RBC 02/22/2024 4.50    Hemoglobin 02/22/2024 9.6 (L)    HCT 02/22/2024 32.7 (L)    MCV 02/22/2024 72.7 (L)    MCH 02/22/2024 21.3 (L)    MCHC 02/22/2024 29.4 (L)    RDW 02/22/2024 17.4 (H)    Platelets 02/22/2024 495 (H)    nRBC 02/22/2024 0.0    Color, Urine 02/22/2024 YELLOW    APPearance 02/22/2024 HAZY (A)    Specific Gravity, Urine 02/22/2024 1.016    pH 02/22/2024 7.0    Glucose, UA 02/22/2024 NEGATIVE    Hgb urine dipstick 02/22/2024 NEGATIVE    Bilirubin Urine 02/22/2024 NEGATIVE    Ketones, ur 02/22/2024  NEGATIVE    Protein, ur 02/22/2024 NEGATIVE    Nitrite 02/22/2024 NEGATIVE    Leukocytes,Ua 02/22/2024 LARGE (A)    RBC / HPF 02/22/2024 11-20    WBC, UA 02/22/2024 >50    Bacteria, UA 02/22/2024 RARE (A)    Squamous Epithelial / HPF 02/22/2024 0-5    Mucus 02/22/2024 PRESENT    Glucose-Capillary 02/22/2024 176 (H)    Preg, Serum 02/22/2024 NEGATIVE    No image results found.     CT ABDOMEN PELVIS W CONTRAST Result Date: 08/19/2024 CLINICAL DATA:  Acute abdominal pain.  Low back pain. EXAM: CT ABDOMEN AND PELVIS WITH CONTRAST TECHNIQUE: Multidetector CT imaging of the abdomen and pelvis was performed using the standard protocol following bolus administration of intravenous contrast. RADIATION DOSE REDUCTION: This exam was performed according to the departmental dose-optimization program which includes automated exposure control, adjustment of the mA and/or kV according to patient size and/or use of iterative reconstruction technique. CONTRAST:  75mL OMNIPAQUE  IOHEXOL  350 MG/ML SOLN COMPARISON:  CT abdomen and pelvis 11/24/2019 FINDINGS: Lower chest: No acute abnormality. Hepatobiliary: No focal liver abnormality is seen. No gallstones, gallbladder wall thickening, or biliary dilatation. Pancreas: Unremarkable. No pancreatic ductal dilatation or surrounding inflammatory changes. Spleen: Normal in size without focal abnormality. Adrenals/Urinary Tract: The adrenal glands, left kidney and bladder are within normal limits. Staghorn type calculus in the right kidney is again seen with additional scattered right renal calculi measuring up to 9 mm. There are multiple areas of cortical scarring. There is right perinephric fat stranding. There is abnormal patchy enhancement pattern throughout the superior pole the right kidney. There some cystic areas in the inferior pole which may represent dilated calices or cysts. There is no hydronephrosis. There is no significant excretion of contrast from the right  kidney. Stomach/Bowel: Stomach is within normal limits. Appendix is not seen. There is sigmoid colon diverticulosis. No evidence of bowel wall thickening, distention, or inflammatory changes. Vascular/Lymphatic: There are prominent retroperitoneal lymph nodes measuring up to 9 mm at the level of the kidneys. There is a stable nodular density measuring 7 mm in the right retroperitoneal region inferior to the right kidney image 3/49. Aorta and IVC are normal in size. There are atherosclerotic calcifications of the aorta. Reproductive: Uterus and bilateral adnexa are unremarkable. Other: No  abdominal wall hernia or abnormality. No abdominopelvic ascites. Musculoskeletal: No fracture is seen. IMPRESSION: 1. Staghorncalculus in the right kidney with additional scattered right renal calculi. Right perinephric fat stranding and abnormal patchy enhancement pattern in the superior pole of the right kidney. There are cystic areas in the inferior pole which may be related to dilated calices. 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 Underlying renal neoplasm not excluded. 2. Prominent retroperitoneal lymph nodes are likely reactive. 3. Sigmoid colon diverticulosis. 4. Aortic atherosclerosis. Aortic Atherosclerosis (ICD10-I70.0). Electronically Signed   By: Greig Pique M.D.   On: 08/19/2024 01:05      ASSESSMENT & PLAN   Assessment & Plan Staghorn calculus Acute pyelonephritis Right staghorn renal calculus (N20.0): Large, branching stone in right kidney associated with chronic infection and significant loss of renal function (13% per recent nuclear scan). Imaging also shows cortical scarring and perinephric fat stranding.   Right staghorn calculus & pyelonephritis:  Continue Augmentin  for 7 additional days to complete a 14-day course. STAT referral to urology for urgent evaluation and surgical planning, likely nephrectomy given severely reduced right renal function and  ongoing infection. Urinalysis and urine culture ordered today to assess for persistent infection and antibiotic sensitivity.  Acute pyelonephritis (N10): Ongoing infection of right kidney with persistent pain and incomplete response to antibiotics. History and imaging suggest xanthogranulomatous pyelonephritis.  Will extend antibiotic(s) while arranging urology follow up. Explained I/D consult explaining long term antibiotic(s) are not recommended due to resistance risks. 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 Asymptomatic; no acute intervention needed. Counsel on symptoms of diverticulitis (fever, abdominal pain, changes in bowel habits). Aortic atherosclerosis (HCC) Aortic atherosclerosis (I70.0): Imaging shows aortic calcifications, risk factor for cardiovascular disease. Continue monitoring cardiovascular risk factors (blood pressure, lipids, glycemic control). Encourage heart-healthy diet and regular exercise. Headache syndrome Headache syndrome (G44.89): Ongoing headaches, will try to manage with Imitrex  and Nurtec, Toradol  shot in clinic DUB (dysfunctional uterine bleeding) Iron deficiency Assured gynecology follow up    Dysfunctional uterine bleeding (N93.8): History of heavy menstrual bleeding contributing to iron deficiency anemia; under gynecology care. Type 2 diabetes mellitus without complication, unspecified whether long term insulin  use (HCC) Lab Results  Component Value Date   HGBA1C 6.0 (A) 10/02/2020  Will order lab testing to guide management.   Type 2 diabetes mellitus without complication (E11.9): Chronic condition, currently without documented complications. Nausea (R11.0): Intermittent, likely related to renal infection; currently no vomiting. Nausea Trial zofran  B12 deficiency Encouraged supplement Continue iron and vitamin B12 supplementation. Maintain scheduled  gynecology follow-up.  Lab Results  Component Value Date/Time   CREATININE 0.71 08/29/2024 11:11 AM   CREATININE 0.76 08/21/2024 05:19 AM   CREATININE 0.82 08/20/2024 06:28 AM   CREATININE 0.80 08/18/2024 03:47 PM   CREATININE 0.86 08/18/2024 03:39 PM         Assessment:    Plan:   Pain management:  Headache: Continue Imitrex  and Nurtec as needed. No additional pain medications required for abdominal or back pain at this time.  Diverticular disease:  Aortic atherosclerosis:  ORDER ASSOCIATIONS  #   DIAGNOSIS / CONDITION ICD-10 ENCOUNTER ORDER     ICD-10-CM   1. Staghorn calculus  N20.0 Ambulatory referral to Urology    2. Diverticular disease  K57.90     3. Aortic atherosclerosis (HCC)  I70.0 Lipid panel    4. Headache syndrome  G44.89 TSH Rfx on Abnormal to Free T4    ketorolac  (TORADOL ) injection 60  mg    ibuprofen  (ADVIL ) 800 MG tablet    DISCONTINUED: SUMAtriptan  (IMITREX ) 50 MG tablet    DISCONTINUED: Rimegepant Sulfate  (NURTEC) 75 MG TBDP    DISCONTINUED: ketorolac  (TORADOL ) injection 60 mg    5. Acute pyelonephritis  N10 Urinalysis w microscopic + reflex cultur    Ambulatory referral to Urology    Comprehensive metabolic panel with GFR    CBC with Differential/Platelet    DISCONTINUED: amoxicillin -clavulanate (AUGMENTIN ) 875-125 MG tablet    6. DUB (dysfunctional uterine bleeding)  N93.8 TSH Rfx on Abnormal to Free T4    7. Type 2 diabetes mellitus without complication, unspecified whether long term insulin  use (HCC)  E11.9 HgB A1c    8. Nausea  R11.0 TSH Rfx on Abnormal to Free T4    DISCONTINUED: ondansetron  (ZOFRAN -ODT) 4 MG disintegrating tablet    9. Iron deficiency  E61.1 Iron, TIBC and Ferritin Panel    10. B12 deficiency  E53.8 B12 and Folate Panel     Ambulatory referral to Urology       Comments: Reason for Referral: STAT evaluation for possible nephrectomy due to non-functioning, chronically infected right kidney. Requires  interpreter, but understands possible nephrectomy is needed.   Clinical Summary: 43 year old female with history of recurrent urinary tract infections and right staghorn renal calculus. 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 Renal scan: Severely reduced right renal function (13%), minimal excretion, left kidney normal (87% function). Symptoms: Persistent low back and pelvic pain, no fever/chills, currently on day 8 of Augmentin  (plan to complete 14 days). 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 Likely candidate for right nephrectomy.  Request: Urgent urology evaluation (within 1 week) for surgical planning and definitive management. Patient continues to have pain and is distressed. Please expedite appointment.  Attachments:  CT abdomen/pelvis report (08/19/2024) Nuclear medicine renal scan report       Recommended follow up: No follow-ups on file. Future Appointments  Date Time Provider Department Center  09/12/2024 11:20 AM Jesus Bernardino MATSU, MD [facility]  10/31/2024  8:40 AM Oley Bascom RAMAN, NP [facility]             Additional notes: This document was synthesized by artificial intelligence ([employer]bridge) using HIPAA-compliant recording of the clinical interaction;   We discussed the use of AI scribe software for clinical note transcription with the patient, who gave verbal consent to proceed.    Additional Info: This encounter employed state-of-the-art, real-time, collaborative documentation. The patient actively reviewed and assisted in updating their electronic medical record on a shared screen, ensuring transparency and facilitating joint problem-solving for the  problem list, overview, and plan. This approach promotes accurate, informed care. The treatment plan was discussed and reviewed in detail, including medication safety, potential side effects, and all patient questions. We confirmed understanding and comfort with the plan. Follow-up instructions were established, including contacting the office for any concerns, returning if symptoms worsen, persist, or new symptoms develop, and precautions for potential emergency department visits.  Initial Appointment Goals:  This initial visit focused on establishing a foundation for the patient's care. We collaboratively reviewed her medical history and medications in detail, updating the chart as shown in the encounter. Given the extensive information, we prioritized addressing her most pressing concerns, which she reported were: New pt (Pt is present to est care  with provider) and Hospitalization Follow-up (Was in hospital for kidney stones pt is having headaches now as well and is concerns about that )  While the complexity of the patient's medical picture may necessitate further evaluation in subsequent visits, we were able to develop a preliminary care plan together. To expedite a comprehensive plan at the next visit, we encouraged the patient to gather relevant medical records from previous providers. This collaborative approach will ensure a more complete understanding of the patient's health and inform the development of a personalized care plan. We look forward to continuing the conversation and working together with the patient on achieving her health goals.   Collaborative Documentation:  Today's encounter utilized real-time, dynamic patient engagement.  Patients actively participate by directly reviewing and assisting in updating their medical records through a shared screen. This transparency empowers patients to visually confirm chart updates made by the healthcare provider.  This collaborative approach  facilitates problem management as we jointly update the problem list, problem overview, and assessment/plan. Ultimately, this process enhances chart accuracy and completeness, fostering shared decision-making, patient education, and informed consent for tests and treatments.  Collaborative Treatment Planning:  Treatment plans were discussed and reviewed in detail.  Explained medication safety and potential side effects.  Encouraged participation and answered all patient questions, confirming understanding and comfort with the plan. Encouraged patient to contact our office if they have any questions or concerns. Agreed on patient returning to office if symptoms worsen, persist, or new symptoms develop.  ----------------------------------------------------- Bernardino KANDICE Cone, MD  08/29/2024 2:59 PM  [facility]:  [phone]

## 2024-08-29 NOTE — Assessment & Plan Note (Signed)
 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

## 2024-08-30 ENCOUNTER — Other Ambulatory Visit: Payer: Self-pay

## 2024-08-30 LAB — IRON,TIBC AND FERRITIN PANEL
%SAT: 6 % — ABNORMAL LOW (ref 16–45)
Ferritin: 12 ng/mL — ABNORMAL LOW (ref 16–232)
Iron: 21 ug/dL — ABNORMAL LOW (ref 40–190)
TIBC: 379 ug/dL (ref 250–450)

## 2024-08-30 LAB — TSH RFX ON ABNORMAL TO FREE T4: TSH: 2.03 u[IU]/mL (ref 0.450–4.500)

## 2024-08-31 LAB — URINALYSIS W MICROSCOPIC + REFLEX CULTURE
Bacteria, UA: NONE SEEN /HPF
Bilirubin Urine: NEGATIVE
Glucose, UA: NEGATIVE
Ketones, ur: NEGATIVE
Nitrites, Initial: NEGATIVE
RBC / HPF: NONE SEEN /HPF (ref 0–2)
Specific Gravity, Urine: 1.017 (ref 1.001–1.035)
WBC, UA: >=60 /HPF — AB (ref 0–5)
pH: 5.5 (ref 5.0–8.0)

## 2024-08-31 LAB — URINE CULTURE
MICRO NUMBER:: 16971386
Result:: NO GROWTH
SPECIMEN QUALITY:: ADEQUATE

## 2024-08-31 LAB — HOUSE ACCOUNT TRACKING

## 2024-08-31 LAB — CULTURE INDICATED

## 2024-09-03 ENCOUNTER — Ambulatory Visit: Payer: Self-pay | Admitting: Internal Medicine

## 2024-09-05 NOTE — Telephone Encounter (Signed)
Results seen in my chart 

## 2024-09-12 ENCOUNTER — Ambulatory Visit: Payer: Self-pay | Admitting: Internal Medicine

## 2024-09-13 ENCOUNTER — Telehealth: Payer: Self-pay

## 2024-09-13 NOTE — Telephone Encounter (Signed)
 Copied from CRM [id number]. Topic: Clinical - Medication Question >> Sep 13, 2024  8:14 AM Aleatha C wrote: Reason for CRM: Patient ran out of antibiotics and still needs more  Sent a mychart message to the pt to make appt pt had appt yesterday no show. She is ok to make appt with any provider in office if her pcp is not available.

## 2024-10-13 ENCOUNTER — Ambulatory Visit: Payer: Self-pay | Admitting: Internal Medicine

## 2024-10-17 ENCOUNTER — Ambulatory Visit (INDEPENDENT_AMBULATORY_CARE_PROVIDER_SITE_OTHER): Payer: Self-pay | Admitting: Internal Medicine

## 2024-10-17 ENCOUNTER — Other Ambulatory Visit: Payer: Self-pay

## 2024-10-17 ENCOUNTER — Encounter: Payer: Self-pay | Admitting: Internal Medicine

## 2024-10-17 VITALS — BP 108/74 | HR 85 | Temp 98.7°F | Ht 61.0 in | Wt 128.6 lb

## 2024-10-17 DIAGNOSIS — G4489 Other headache syndrome: Secondary | ICD-10-CM | POA: Diagnosis not present

## 2024-10-17 DIAGNOSIS — N390 Urinary tract infection, site not specified: Secondary | ICD-10-CM

## 2024-10-17 DIAGNOSIS — E1165 Type 2 diabetes mellitus with hyperglycemia: Secondary | ICD-10-CM

## 2024-10-17 DIAGNOSIS — N200 Calculus of kidney: Secondary | ICD-10-CM

## 2024-10-17 DIAGNOSIS — Z603 Acculturation difficulty: Secondary | ICD-10-CM

## 2024-10-17 DIAGNOSIS — Z758 Other problems related to medical facilities and other health care: Secondary | ICD-10-CM

## 2024-10-17 LAB — CBC WITH DIFFERENTIAL/PLATELET
Basophils Absolute: 0 K/uL (ref 0.0–0.1)
Basophils Relative: 0.4 % (ref 0.0–3.0)
Eosinophils Absolute: 0.1 K/uL (ref 0.0–0.7)
Eosinophils Relative: 1.1 % (ref 0.0–5.0)
HCT: 34.4 % — ABNORMAL LOW (ref 36.0–46.0)
Hemoglobin: 10.7 g/dL — ABNORMAL LOW (ref 12.0–15.0)
Lymphocytes Relative: 28.1 % (ref 12.0–46.0)
Lymphs Abs: 2.8 K/uL (ref 0.7–4.0)
MCHC: 31.1 g/dL (ref 30.0–36.0)
MCV: 74.8 fl — ABNORMAL LOW (ref 78.0–100.0)
Monocytes Absolute: 0.6 K/uL (ref 0.1–1.0)
Monocytes Relative: 5.9 % (ref 3.0–12.0)
Neutro Abs: 6.5 K/uL (ref 1.4–7.7)
Neutrophils Relative %: 64.5 % (ref 43.0–77.0)
Platelets: 382 K/uL (ref 150.0–400.0)
RBC: 4.59 Mil/uL (ref 3.87–5.11)
RDW: 19.6 % — ABNORMAL HIGH (ref 11.5–15.5)
WBC: 10.1 K/uL (ref 4.0–10.5)

## 2024-10-17 LAB — COMPREHENSIVE METABOLIC PANEL WITH GFR
ALT: 6 U/L (ref 0–35)
AST: 8 U/L (ref 0–37)
Albumin: 3.9 g/dL (ref 3.5–5.2)
Alkaline Phosphatase: 89 U/L (ref 39–117)
BUN: 11 mg/dL (ref 6–23)
CO2: 25 meq/L (ref 19–32)
Calcium: 8.7 mg/dL (ref 8.4–10.5)
Chloride: 102 meq/L (ref 96–112)
Creatinine, Ser: 0.82 mg/dL (ref 0.40–1.20)
GFR: 88.09 mL/min (ref >60.00)
Glucose, Bld: 118 mg/dL — ABNORMAL HIGH (ref 70–99)
Potassium: 4.2 meq/L (ref 3.5–5.1)
Sodium: 136 meq/L (ref 135–145)
Total Bilirubin: 0.3 mg/dL (ref 0.2–1.2)
Total Protein: 7.6 g/dL (ref 6.0–8.3)

## 2024-10-17 LAB — LIPID PANEL
Cholesterol: 160 mg/dL (ref 0–200)
HDL: 56.7 mg/dL (ref >39.00)
LDL Cholesterol: 86 mg/dL (ref 0–99)
NonHDL: 103.11
Total CHOL/HDL Ratio: 3
Triglycerides: 84 mg/dL (ref 0.0–149.0)
VLDL: 16.8 mg/dL (ref 0.0–40.0)

## 2024-10-17 LAB — HEMOGLOBIN A1C: Hgb A1c MFr Bld: 6.1 % (ref 4.6–6.5)

## 2024-10-17 MED ORDER — AMOXICILLIN-POT CLAVULANATE 875-125 MG PO TABS
1.0000 | ORAL_TABLET | Freq: Two times a day (BID) | ORAL | 0 refills | Status: AC
  Filled 2024-10-17: qty 20, 10d supply, fill #0

## 2024-10-17 NOTE — Assessment & Plan Note (Signed)
 She has a history of medication use but is currently not on medication. Past weight loss led to remission, but there has been no recent evaluation of her diabetes status. Order blood work to evaluate diabetes status, including kidney function and a metabolic panel. Suspect well-controlled but Shared decision-making done; patient understood rationale and agreed to [facility]

## 2024-10-17 NOTE — Assessment & Plan Note (Signed)
 She reports headache(s)  with chewing, radiating to the top of the head, which began after her second pregnancy. Possible temporomandibular joint involvement is suspected. Advise consultation with a dentist for evaluation of jaw pain.with chewing may be related to a headache syndrome, with pain radiating from the jaw to the top of the head and exacerbated by chewing. Symptoms began after her second pregnancy. Place a referral to neurology for evaluation of headache syndrome.

## 2024-10-17 NOTE — Progress Notes (Signed)
 Eesstt  ffoorr  librarian, academic  ppoolllliinngg==============================  [facility]: [phone]   -- Medical Office Visit --  Patient: [name]      Age: 43 y.o.       Sex:  female  Date:   10/17/2024 Today's Healthcare Provider: Bernardino KANDICE Cone, MD  ==============================   Chief Complaint: Flank Pain (Has been having some pains comes and goes follow up on that today)   Discussed the use of AI scribe software for clinical note transcription with the patient, who gave verbal consent to proceed.  History of Present Illness 43 year old female with a history of kidney stones who presents with flank pain persistent- close follow up for recurrent urinary tract infection (UTI) hospitalizing from [place] with planned nephrectomy per urology.  She experiences intermittent flank pain with intensity ranging from 4 to 6 out of 10, persisting for about a week. The pain was more intense a week ago. She has increased urinary frequency with small urine volumes. No recent hospitalizations or fever since her last visit.  She has a history of kidney stones. She was hospitalized in 2015 for diabetes and was on medication until 2020 when it was deemed unnecessary. She lost 8 to 10 pounds during that time. She drinks at least three liters of water daily to manage her condition.  She reports top of head headache associated with chewing (no temporomandibular joint pain) and that began after her second pregnancy. The pain occurs when she starts chewing and is described as painful with a sensation of blockage. The pain radiates from the top of her head downwards.  Background Reviewed: Problem List: has DUB (dysfunctional uterine bleeding); Uterine fibroid; Hydronephrosis; Hypokalemia; Nausea and vomiting; Hyperglycemia; DM type 2 (diabetes mellitus, type 2) (HCC); Headache syndrome; Pyelonephritis; Lymphadenopathy; Acute  pyelonephritis; Microcytic anemia; [place]aghorn calculus; Aortic atherosclerosis; and Diverticular disease on their problem list. Past Medical History:  has a past medical history of Carpal tunnel syndrome on both sides (01/2016), Headache, Headache syndrome (05/26/2019), Non-insulin  dependent type 2 diabetes mellitus (HCC), Pneumonia due to COVID-19 virus, and Pyelonephritis (11/23/2019). Past Surgical History:   has a past surgical history that includes No past surgeries and Bilateral carpal tunnel release (Bilateral, 02/06/2016). Social History:   reports that she has never smoked. She has never used smokeless tobacco. She reports that she does not drink alcohol and does not use drugs. Family History:  family history includes Healthy in her father and mother. Allergies:  has no known allergies.   Medication Reconciliation: Current Outpatient Medications on File Prior to Visit  Medication Sig   ibuprofen  (ADVIL ) 800 MG tablet Take 1 tablet (800 mg total) by mouth every 8 (eight) hours as needed. (Patient not taking: Reported on 10/17/2024)   ondansetron  (ZOFRAN -ODT) 4 MG disintegrating tablet Take 1 tablet (4 mg total) by mouth every 8 (eight) hours as needed for nausea or vomiting. (Patient not taking: Reported on 10/17/2024)   Rimegepant Sulfate  (NURTEC) 75 MG TBDP Take 1 tablet (75 mg total) by mouth daily at 6 (six) AM. (Patient not taking: Reported on 10/17/2024)   SUMAtriptan  (IMITREX ) 50 MG tablet Take 1 tablet (50 mg total) by mouth daily as needed for migraine. May repeat in 2 hours if headache persists or recurs. (Patient not taking: Reported on 10/17/2024)   No current facility-administered medications on file prior to visit.   Medications Discontinued During This Encounter  Medication Reason   amoxicillin -clavulanate (AUGMENTIN ) 875-125 MG tablet  Reorder    Physical Exam:    10/17/2024    8:58 AM 08/29/2024   10:09 AM 08/22/2024    8:02 AM  Vitals with BMI  Height 5' 1 5' 1   Weight 128  lbs 10 oz    BMI 24.31    Systolic 108  107  Diastolic 74  73  Pulse 85  68  Vital signs reviewed.  Nursing notes reviewed. Weight trend reviewed. Physical Activity: Not on file   General Appearance:  No acute distress appreciable.   Well-groomed, healthy-appearing female.  Well proportioned with no abnormal fat distribution.  Good muscle tone. Pulmonary:  Normal work of breathing at rest, no respiratory distress apparent. SpO2: 98 %  Musculoskeletal: All extremities are intact.  Neurological:  Awake, alert, oriented, and engaged.  No obvious focal neurological deficits or cognitive impairments.  Sensorium seems unclouded.   Speech is clear and coherent with logical content. Psychiatric:  Appropriate mood, pleasant and cooperative demeanor, thoughtful and engaged during the exam  Verbalized to patient: Physical Exam ABDOMEN: Kidneys non-tender to percussion bilaterally.  Orders Only on 08/29/2024  Component Date Value Ref Range Status   Color, Urine 08/29/2024 YELLOW  YELLOW Final   APPearance 08/29/2024 CLEAR  CLEAR Final   Specific Gravity, Urine 08/29/2024 1.017  1.001 - 1.035 Final   pH 08/29/2024 5.5  5.0 - 8.0 Final   Glucose, UA 08/29/2024 NEGATIVE  NEGATIVE Final   Bilirubin Urine 08/29/2024 NEGATIVE  NEGATIVE Final   Ketones, ur 08/29/2024 NEGATIVE  NEGATIVE Final   Hgb urine dipstick 08/29/2024 1+ (A)  NEGATIVE Final   Protein, ur 08/29/2024 TRACE (A)  NEGATIVE Final   Nitrites, Initial 08/29/2024 NEGATIVE  NEGATIVE Final   Leukocyte Esterase 08/29/2024 3+ (A)  NEGATIVE Final   WBC, UA 08/29/2024 > OR = 60 (A)  0 - 5 /HPF Final   RBC / HPF 08/29/2024 NONE SEEN  0 - 2 /HPF Final   Squamous Epithelial / HPF 08/29/2024 0-5  < OR = 5 /HPF Final   Bacteria, UA 08/29/2024 NONE SEEN  NONE SEEN /HPF Final   Hyaline Cast 08/29/2024 0-5 (A)  NONE SEEN /LPF Final   Note 08/29/2024    Final   MICRO NUMBER: 08/29/2024 [id number]   Final   SPECIMEN QUALITY: 08/29/2024 Adequate   Final    Sample Source 08/29/2024 URINE   Final   STATUS: 08/29/2024 FINAL   Final   Result: 08/29/2024 No Growth   Final   REFLEXIVE URINE CULTURE 08/29/2024    Final   Tracking House Account 08/29/2024    Final  Office Visit on 08/29/2024  Component Date Value Ref Range Status   Cholesterol 08/29/2024 179  0 - 200 mg/dL Final   Triglycerides 90/84/7974 112.0  0.0 - 149.0 mg/dL Final   HDL 90/84/7974 47.20  >39.00 mg/dL Final   VLDL 90/84/7974 22.4  0.0 - 40.0 mg/dL Final   LDL Cholesterol 08/29/2024 110 (H)  0 - 99 mg/dL Final   Total CHOL/HDL Ratio 08/29/2024 4   Final   NonHDL 08/29/2024 131.98   Final   Sodium 08/29/2024 136  135 - 145 mEq/L Final   Potassium 08/29/2024 3.9  3.5 - 5.1 mEq/L Final   Chloride 08/29/2024 103  96 - 112 mEq/L Final   CO2 08/29/2024 24  19 - 32 mEq/L Final   Glucose, Bld 08/29/2024 105 (H)  70 - 99 mg/dL Final   BUN 90/84/7974 14  6 - 23 mg/dL Final  Creatinine, Ser 08/29/2024 0.71  0.40 - 1.20 mg/dL Final   Total Bilirubin 08/29/2024 0.3  0.2 - 1.2 mg/dL Final   Alkaline Phosphatase 08/29/2024 80  39 - 117 U/L Final   AST 08/29/2024 14  0 - 37 U/L Final   ALT 08/29/2024 10  0 - 35 U/L Final   Total Protein 08/29/2024 8.5 (H)  6.0 - 8.3 g/dL Final   Albumin 90/84/7974 4.3  3.5 - 5.2 g/dL Final   GFR 90/84/7974 104.81  >60.00 mL/min Final   Calcium  08/29/2024 9.6  8.4 - 10.5 mg/dL Final   WBC 90/84/7974 7.7  4.0 - 10.5 K/uL Final   RBC 08/29/2024 4.90  3.87 - 5.11 Mil/uL Final   Hemoglobin 08/29/2024 10.7 (L)  12.0 - 15.0 g/dL Final   HCT 90/84/7974 34.4 (L)  36.0 - 46.0 % Final   MCV 08/29/2024 70.3 (L)  78.0 - 100.0 fl Final   MCHC 08/29/2024 31.0  30.0 - 36.0 g/dL Final   RDW 90/84/7974 18.0 (H)  11.5 - 15.5 % Final   Platelets 08/29/2024 468.0 (H)  150.0 - 400.0 K/uL Final   Neutrophils Relative % 08/29/2024 54.7  43.0 - 77.0 % Final   Lymphocytes Relative 08/29/2024 37.5  12.0 - 46.0 % Final   Monocytes Relative 08/29/2024 5.7  3.0 - 12.0 % Final    Eosinophils Relative 08/29/2024 1.4  0.0 - 5.0 % Final   Basophils Relative 08/29/2024 0.7  0.0 - 3.0 % Final   Neutro Abs 08/29/2024 4.2  1.4 - 7.7 K/uL Final   Lymphs Abs 08/29/2024 2.9  0.7 - 4.0 K/uL Final   Monocytes Absolute 08/29/2024 0.4  0.1 - 1.0 K/uL Final   Eosinophils Absolute 08/29/2024 0.1  0.0 - 0.7 K/uL Final   Basophils Absolute 08/29/2024 0.1  0.0 - 0.1 K/uL Final   TSH 08/29/2024 2.030  0.450 - 4.500 uIU/mL Final   Vitamin B-12 08/29/2024 883  211 - 911 pg/mL Final   Folate 08/29/2024 10.9  >5.9 ng/mL Final   Iron 08/29/2024 21 (L)  40 - 190 mcg/dL Final   TIBC 90/84/7974 379  250 - 450 mcg/dL (calc) Final   %SAT 90/84/7974 6 (L)  16 - 45 % (calc) Final   Ferritin 08/29/2024 12 (L)  16 - 232 ng/mL Final  Admission on 08/18/2024, Discharged on 08/22/2024  Component Date Value Ref Range Status   Preg Test, Ur 08/18/2024 NEGATIVE  NEGATIVE Final   Color, Urine 08/18/2024 STRAW (A)  YELLOW Final   APPearance 08/18/2024 CLEAR  CLEAR Final   Specific Gravity, Urine 08/18/2024 1.005  1.005 - 1.030 Final   pH 08/18/2024 7.0  5.0 - 8.0 Final   Glucose, UA 08/18/2024 NEGATIVE  NEGATIVE mg/dL Final   Hgb urine dipstick 08/18/2024 SMALL (A)  NEGATIVE Final   Bilirubin Urine 08/18/2024 NEGATIVE  NEGATIVE Final   Ketones, ur 08/18/2024 NEGATIVE  NEGATIVE mg/dL Final   Protein, ur 90/95/7974 NEGATIVE  NEGATIVE mg/dL Final   Nitrite 90/95/7974 NEGATIVE  NEGATIVE Final   Leukocytes,Ua 08/18/2024 LARGE (A)  NEGATIVE Final   RBC / HPF 08/18/2024 0-5  0 - 5 RBC/hpf Final   WBC, UA 08/18/2024 >50  0 - 5 WBC/hpf Final   Bacteria, UA 08/18/2024 FEW (A)  NONE SEEN Final   Squamous Epithelial / HPF 08/18/2024 0-5  0 - 5 /HPF Final   WBC Clumps 08/18/2024 PRESENT   Final   WBC 08/18/2024 8.9  4.0 - 10.5 K/uL Final   RBC  08/18/2024 5.04  3.87 - 5.11 MIL/uL Final   Hemoglobin 08/18/2024 11.0 (L)  12.0 - 15.0 g/dL Final   HCT 90/95/7974 38.0  36.0 - 46.0 % Final   MCV 08/18/2024 75.4  (L)  80.0 - 100.0 fL Final   MCH 08/18/2024 21.8 (L)  26.0 - 34.0 pg Final   MCHC 08/18/2024 28.9 (L)  30.0 - 36.0 g/dL Final   RDW 90/95/7974 16.8 (H)  11.5 - 15.5 % Final   Platelets 08/18/2024 477 (H)  150 - 400 K/uL Final   nRBC 08/18/2024 0.0  0.0 - 0.2 % Final   Neutrophils Relative % 08/18/2024 57  % Final   Neutro Abs 08/18/2024 5.0  1.7 - 7.7 K/uL Final   Lymphocytes Relative 08/18/2024 35  % Final   Lymphs Abs 08/18/2024 3.1  0.7 - 4.0 K/uL Final   Monocytes Relative 08/18/2024 6  % Final   Monocytes Absolute 08/18/2024 0.6  0.1 - 1.0 K/uL Final   Eosinophils Relative 08/18/2024 1  % Final   Eosinophils Absolute 08/18/2024 0.1  0.0 - 0.5 K/uL Final   Basophils Relative 08/18/2024 1  % Final   Basophils Absolute 08/18/2024 0.1  0.0 - 0.1 K/uL Final   Immature Granulocytes 08/18/2024 0  % Final   Abs Immature Granulocytes 08/18/2024 0.03  0.00 - 0.07 K/uL Final   Magnesium  08/18/2024 1.8  1.7 - 2.4 mg/dL Final   Sodium 90/95/7974 138  135 - 145 mmol/L Final   Potassium 08/18/2024 3.4 (L)  3.5 - 5.1 mmol/L Final   Chloride 08/18/2024 106  98 - 111 mmol/L Final   CO2 08/18/2024 20 (L)  22 - 32 mmol/L Final   Glucose, Bld 08/18/2024 141 (H)  70 - 99 mg/dL Final   BUN 90/95/7974 8  6 - 20 mg/dL Final   Creatinine, Ser 08/18/2024 0.86  0.44 - 1.00 mg/dL Final   Calcium  08/18/2024 9.0  8.9 - 10.3 mg/dL Final   Total Protein 90/95/7974 8.1  6.5 - 8.1 g/dL Final   Albumin 90/95/7974 3.5  3.5 - 5.0 g/dL Final   AST 90/95/7974 14 (L)  15 - 41 U/L Final   ALT 08/18/2024 8  0 - 44 U/L Final   Alkaline Phosphatase 08/18/2024 80  38 - 126 U/L Final   Total Bilirubin 08/18/2024 0.3  0.0 - 1.2 mg/dL Final   GFR, Estimated 08/18/2024 >60  >60 mL/min Final   Anion gap 08/18/2024 12  5 - 15 Final   Lipase 08/18/2024 38  11 - 51 U/L Final   Sodium 08/18/2024 139  135 - 145 mmol/L Final   Potassium 08/18/2024 3.4 (L)  3.5 - 5.1 mmol/L Final   Chloride 08/18/2024 107  98 - 111 mmol/L Final    BUN 08/18/2024 9  6 - 20 mg/dL Final   Creatinine, Ser 08/18/2024 0.80  0.44 - 1.00 mg/dL Final   Glucose, Bld 90/95/7974 147 (H)  70 - 99 mg/dL Final   Calcium , Ion 08/18/2024 1.05 (L)  1.15 - 1.40 mmol/L Final   TCO2 08/18/2024 18 (L)  22 - 32 mmol/L Final   Hemoglobin 08/18/2024 12.9  12.0 - 15.0 g/dL Final   HCT 90/95/7974 38.0  36.0 - 46.0 % Final   Specimen Description 08/18/2024 URINE, CLEAN CATCH   Final   Special Requests 08/18/2024 NONE   Final   Culture 08/18/2024  (A)   Final  Value:<10,000 COLONIES/mL INSIGNIFICANT GROWTH Performed at [facility], [address], [place], [place] [address]    Report Status 08/18/2024 08/19/2024 FINAL   Final   HIV Screen 4th Generation wRfx 08/20/2024 Non Reactive  Non Reactive Final   Sodium 08/20/2024 136  135 - 145 mmol/L Final   Potassium 08/20/2024 4.1  3.5 - 5.1 mmol/L Final   Chloride 08/20/2024 108  98 - 111 mmol/L Final   CO2 08/20/2024 20 (L)  22 - 32 mmol/L Final   Glucose, Bld 08/20/2024 93  70 - 99 mg/dL Final   BUN 90/93/7974 7  6 - 20 mg/dL Final   Creatinine, Ser 08/20/2024 0.82  0.44 - 1.00 mg/dL Final   Calcium  08/20/2024 8.3 (L)  8.9 - 10.3 mg/dL Final   Total Protein 90/93/7974 6.8  6.5 - 8.1 g/dL Final   Albumin 90/93/7974 2.8 (L)  3.5 - 5.0 g/dL Final   AST 90/93/7974 15  15 - 41 U/L Final   ALT 08/20/2024 8  0 - 44 U/L Final   Alkaline Phosphatase 08/20/2024 65  38 - 126 U/L Final   Total Bilirubin 08/20/2024 0.3  0.0 - 1.2 mg/dL Final   GFR, Estimated 08/20/2024 >60  >60 mL/min Final   Anion gap 08/20/2024 8  5 - 15 Final   WBC 08/20/2024 10.3  4.0 - 10.5 K/uL Final   RBC 08/20/2024 4.20  3.87 - 5.11 MIL/uL Final   Hemoglobin 08/20/2024 9.2 (L)  12.0 - 15.0 g/dL Final   HCT 90/93/7974 30.8 (L)  36.0 - 46.0 % Final   MCV 08/20/2024 73.3 (L)  80.0 - 100.0 fL Final   MCH 08/20/2024 21.9 (L)  26.0 - 34.0 pg Final   MCHC 08/20/2024 29.9 (L)  30.0 - 36.0 g/dL Final   RDW 90/93/7974 16.8 (H)   11.5 - 15.5 % Final   Platelets 08/20/2024 361  150 - 400 K/uL Final   nRBC 08/20/2024 0.0  0.0 - 0.2 % Final   Magnesium  08/20/2024 1.8  1.7 - 2.4 mg/dL Final   Vitamin A-87 90/93/7974 302  180 - 914 pg/mL Final   Folate 08/20/2024 12.2  >5.9 ng/mL Final   Iron 08/20/2024 23 (L)  28 - 170 ug/dL Final   TIBC 90/93/7974 379  250 - 450 ug/dL Final   Saturation Ratios 08/20/2024 6 (L)  10.4 - 31.8 % Final   UIBC 08/20/2024 356  ug/dL Final   Ferritin 90/93/7974 10 (L)  11 - 307 ng/mL Final   Retic Ct Pct 08/20/2024 0.9  0.4 - 3.1 % Final   RBC. 08/20/2024 4.24  3.87 - 5.11 MIL/uL Final   Retic Count, Absolute 08/20/2024 36.9  19.0 - 186.0 K/uL Final   Immature Retic Fract 08/20/2024 10.0  2.3 - 15.9 % Final   WBC 08/21/2024 8.9  4.0 - 10.5 K/uL Final   RBC 08/21/2024 4.34  3.87 - 5.11 MIL/uL Final   Hemoglobin 08/21/2024 9.4 (L)  12.0 - 15.0 g/dL Final   HCT 90/92/7974 31.5 (L)  36.0 - 46.0 % Final   MCV 08/21/2024 72.6 (L)  80.0 - 100.0 fL Final   MCH 08/21/2024 21.7 (L)  26.0 - 34.0 pg Final   MCHC 08/21/2024 29.8 (L)  30.0 - 36.0 g/dL Final   RDW 90/92/7974 16.5 (H)  11.5 - 15.5 % Final   Platelets 08/21/2024 398  150 - 400 K/uL Final   nRBC 08/21/2024 0.0  0.0 - 0.2 % Final   Sodium 08/21/2024  137  135 - 145 mmol/L Final   Potassium 08/21/2024 4.0  3.5 - 5.1 mmol/L Final   Chloride 08/21/2024 107  98 - 111 mmol/L Final   CO2 08/21/2024 20 (L)  22 - 32 mmol/L Final   Glucose, Bld 08/21/2024 101 (H)  70 - 99 mg/dL Final   BUN 90/92/7974 8  6 - 20 mg/dL Final   Creatinine, Ser 08/21/2024 0.76  0.44 - 1.00 mg/dL Final   Calcium  08/21/2024 8.6 (L)  8.9 - 10.3 mg/dL Final   GFR, Estimated 08/21/2024 >60  >60 mL/min Final   Anion gap 08/21/2024 10  5 - 15 Final  Admission on 02/22/2024, Discharged on 02/23/2024  Component Date Value Ref Range Status   Sodium 02/22/2024 137  135 - 145 mmol/L Final   Potassium 02/22/2024 4.0  3.5 - 5.1 mmol/L Final   Chloride 02/22/2024 101  98 - 111  mmol/L Final   CO2 02/22/2024 23  22 - 32 mmol/L Final   Glucose, Bld 02/22/2024 198 (H)  70 - 99 mg/dL Final   BUN 96/89/7974 11  6 - 20 mg/dL Final   Creatinine, Ser 02/22/2024 0.72  0.44 - 1.00 mg/dL Final   Calcium  02/22/2024 8.6 (L)  8.9 - 10.3 mg/dL Final   GFR, Estimated 02/22/2024 >60  >60 mL/min Final   Anion gap 02/22/2024 13  5 - 15 Final   WBC 02/22/2024 10.8 (H)  4.0 - 10.5 K/uL Final   RBC 02/22/2024 4.50  3.87 - 5.11 MIL/uL Final   Hemoglobin 02/22/2024 9.6 (L)  12.0 - 15.0 g/dL Final   HCT 96/89/7974 32.7 (L)  36.0 - 46.0 % Final   MCV 02/22/2024 72.7 (L)  80.0 - 100.0 fL Final   MCH 02/22/2024 21.3 (L)  26.0 - 34.0 pg Final   MCHC 02/22/2024 29.4 (L)  30.0 - 36.0 g/dL Final   RDW 96/89/7974 17.4 (H)  11.5 - 15.5 % Final   Platelets 02/22/2024 495 (H)  150 - 400 K/uL Final   nRBC 02/22/2024 0.0  0.0 - 0.2 % Final   Color, Urine 02/22/2024 YELLOW  YELLOW Final   APPearance 02/22/2024 HAZY (A)  CLEAR Final   Specific Gravity, Urine 02/22/2024 1.016  1.005 - 1.030 Final   pH 02/22/2024 7.0  5.0 - 8.0 Final   Glucose, UA 02/22/2024 NEGATIVE  NEGATIVE mg/dL Final   Hgb urine dipstick 02/22/2024 NEGATIVE  NEGATIVE Final   Bilirubin Urine 02/22/2024 NEGATIVE  NEGATIVE Final   Ketones, ur 02/22/2024 NEGATIVE  NEGATIVE mg/dL Final   Protein, ur 96/89/7974 NEGATIVE  NEGATIVE mg/dL Final   Nitrite 96/89/7974 NEGATIVE  NEGATIVE Final   Leukocytes,Ua 02/22/2024 LARGE (A)  NEGATIVE Final   RBC / HPF 02/22/2024 11-20  0 - 5 RBC/hpf Final   WBC, UA 02/22/2024 >50  0 - 5 WBC/hpf Final   Bacteria, UA 02/22/2024 RARE (A)  NONE SEEN Final   Squamous Epithelial / HPF 02/22/2024 0-5  0 - 5 /HPF Final   Mucus 02/22/2024 PRESENT   Final   Glucose-Capillary 02/22/2024 176 (H)  70 - 99 mg/dL Final   Preg, Serum 96/89/7974 NEGATIVE  NEGATIVE Final  Admission on 12/15/2022, Discharged on 12/15/2022  Component Date Value Ref Range Status   WBC 12/15/2022 6.2  4.0 - 10.5 K/uL Final   RBC  12/15/2022 5.07  3.87 - 5.11 MIL/uL Final   Hemoglobin 12/15/2022 11.1 (L)  12.0 - 15.0 g/dL Final   HCT 98/98/7975 37.0  36.0 - 46.0 %  Final   MCV 12/15/2022 73.0 (L)  80.0 - 100.0 fL Final   MCH 12/15/2022 21.9 (L)  26.0 - 34.0 pg Final   MCHC 12/15/2022 30.0  30.0 - 36.0 g/dL Final   RDW 98/98/7975 16.2 (H)  11.5 - 15.5 % Final   Platelets 12/15/2022 470 (H)  150 - 400 K/uL Final   nRBC 12/15/2022 0.0  0.0 - 0.2 % Final   Neutrophils Relative % 12/15/2022 55  % Final   Neutro Abs 12/15/2022 3.5  1.7 - 7.7 K/uL Final   Lymphocytes Relative 12/15/2022 30  % Final   Lymphs Abs 12/15/2022 1.8  0.7 - 4.0 K/uL Final   Monocytes Relative 12/15/2022 13  % Final   Monocytes Absolute 12/15/2022 0.8  0.1 - 1.0 K/uL Final   Eosinophils Relative 12/15/2022 1  % Final   Eosinophils Absolute 12/15/2022 0.1  0.0 - 0.5 K/uL Final   Basophils Relative 12/15/2022 1  % Final   Basophils Absolute 12/15/2022 0.1  0.0 - 0.1 K/uL Final   Immature Granulocytes 12/15/2022 0  % Final   Abs Immature Granulocytes 12/15/2022 0.02  0.00 - 0.07 K/uL Final   Sodium 12/15/2022 136  135 - 145 mmol/L Final   Potassium 12/15/2022 3.6  3.5 - 5.1 mmol/L Final   Chloride 12/15/2022 104  98 - 111 mmol/L Final   CO2 12/15/2022 21 (L)  22 - 32 mmol/L Final   Glucose, Bld 12/15/2022 154 (H)  70 - 99 mg/dL Final   BUN 98/98/7975 <5 (L)  6 - 20 mg/dL Final   Creatinine, Ser 12/15/2022 0.74  0.44 - 1.00 mg/dL Final   Calcium  12/15/2022 8.8 (L)  8.9 - 10.3 mg/dL Final   Total Protein 98/98/7975 7.5  6.5 - 8.1 g/dL Final   Albumin 98/98/7975 3.7  3.5 - 5.0 g/dL Final   AST 98/98/7975 21  15 - 41 U/L Final   ALT 12/15/2022 14  0 - 44 U/L Final   Alkaline Phosphatase 12/15/2022 84  38 - 126 U/L Final   Total Bilirubin 12/15/2022 0.3  0.3 - 1.2 mg/dL Final   GFR, Estimated 12/15/2022 >60  >60 mL/min Final   Anion gap 12/15/2022 11  5 - 15 Final   SARS Coronavirus 2 by RT PCR 12/15/2022 NEGATIVE  NEGATIVE Final   Influenza A by  PCR 12/15/2022 NEGATIVE  NEGATIVE Final   Influenza B by PCR 12/15/2022 NEGATIVE  NEGATIVE Final   Resp Syncytial Virus by PCR 12/15/2022 POSITIVE (A)  NEGATIVE Final   hCG, Beta Chain, Quant, S 12/15/2022 <1  <5 mIU/mL Final   Color, Urine 12/15/2022 YELLOW  YELLOW Final   APPearance 12/15/2022 CLOUDY (A)  CLEAR Final   Specific Gravity, Urine 12/15/2022 1.010  1.005 - 1.030 Final   pH 12/15/2022 5.0  5.0 - 8.0 Final   Glucose, UA 12/15/2022 NEGATIVE  NEGATIVE mg/dL Final   Hgb urine dipstick 12/15/2022 SMALL (A)  NEGATIVE Final   Bilirubin Urine 12/15/2022 NEGATIVE  NEGATIVE Final   Ketones, ur 12/15/2022 5 (A)  NEGATIVE mg/dL Final   Protein, ur 98/98/7975 100 (A)  NEGATIVE mg/dL Final   Nitrite 98/98/7975 NEGATIVE  NEGATIVE Final   Leukocytes,Ua 12/15/2022 LARGE (A)  NEGATIVE Final   RBC / HPF 12/15/2022 21-50  0 - 5 RBC/hpf Final   WBC, UA 12/15/2022 >50 (H)  0 - 5 WBC/hpf Final   Bacteria, UA 12/15/2022 RARE (A)  NONE SEEN Final   Squamous Epithelial / HPF 12/15/2022 6-10  0 - 5 /HPF Final   WBC Clumps 12/15/2022 PRESENT   Final   Mucus 12/15/2022 PRESENT   Final   Non Squamous Epithelial 12/15/2022 0-5 (A)  NONE SEEN Final  Admission on 12/06/2022, Discharged on 12/06/2022  Component Date Value Ref Range Status   SARS Coronavirus 2 12/06/2022 NEGATIVE  NEGATIVE Final  No image results found. NM Renal Imaging Flow W/Pharm Result Date: 08/22/2024 CLINICAL DATA:  Right renal chronic pyelonephritis EXAM: NUCLEAR MEDICINE RENAL SCAN WITH DIURETIC ADMINISTRATION TECHNIQUE: Radionuclide angiographic and sequential renal images were obtained after intravenous injection of radiopharmaceutical. Imaging was continued during slow intravenous injection of Lasix  approximately 10 minutes after the start of the examination. RADIOPHARMACEUTICALS:  5.5 mCi Technetium-33m MAG3 IV COMPARISON:  CT abdomen pelvis August 19, 2024 FINDINGS: Flow: Significantly diminished flow to the right kidney. Normal  perfusion of the left kidney. Renogram: Delayed parenchymal uptake of the right kidney with minimal delayed excretion of the lower collecting system without significant change on post Lasix  injection. Normal uptake and excretion on the left pre and post Lasix . Differential: Left kidney = 87 % Right kidney = 13 % T1/2 post Lasix  : Left kidney = 7.3 min Right kidney = never achieved Moderate amount of postvoid residual urine within the bladder. IMPRESSION: Diminished perfusion and function of right kidney with some excretion within the lower pole collecting system consistent with known history of xanthogranulomatous pyelonephritis and staghorn calculi. Normal left kidney. Electronically Signed   By: Megan  Zare M.D.   On: 08/22/2024 19:49   CT ABDOMEN PELVIS W CONTRAST Result Date: 08/19/2024 CLINICAL DATA:  Acute abdominal pain.  Low back pain. EXAM: CT ABDOMEN AND PELVIS WITH CONTRAST TECHNIQUE: Multidetector CT imaging of the abdomen and pelvis was performed using the standard protocol following bolus administration of intravenous contrast. RADIATION DOSE REDUCTION: This exam was performed according to the departmental dose-optimization program which includes automated exposure control, adjustment of the mA and/or kV according to patient size and/or use of iterative reconstruction technique. CONTRAST:  75mL OMNIPAQUE  IOHEXOL  350 MG/ML SOLN COMPARISON:  CT abdomen and pelvis 11/24/2019 FINDINGS: Lower chest: No acute abnormality. Hepatobiliary: No focal liver abnormality is seen. No gallstones, gallbladder wall thickening, or biliary dilatation. Pancreas: Unremarkable. No pancreatic ductal dilatation or surrounding inflammatory changes. Spleen: Normal in size without focal abnormality. Adrenals/Urinary Tract: The adrenal glands, left kidney and bladder are within normal limits. Staghorn type calculus in the right kidney is again seen with additional scattered right renal calculi measuring up to 9 mm. There are  multiple areas of cortical scarring. There is right perinephric fat stranding. There is abnormal patchy enhancement pattern throughout the superior pole the right kidney. There some cystic areas in the inferior pole which may represent dilated calices or cysts. There is no hydronephrosis. There is no significant excretion of contrast from the right kidney. Stomach/Bowel: Stomach is within normal limits. Appendix is not seen. There is sigmoid colon diverticulosis. No evidence of bowel wall thickening, distention, or inflammatory changes. Vascular/Lymphatic: There are prominent retroperitoneal lymph nodes measuring up to 9 mm at the level of the kidneys. There is a stable nodular density measuring 7 mm in the right retroperitoneal region inferior to the right kidney image 3/49. Aorta and IVC are normal in size. There are atherosclerotic calcifications of the aorta. Reproductive: Uterus and bilateral adnexa are unremarkable. Other: No abdominal wall hernia or abnormality. No abdominopelvic ascites. Musculoskeletal: No fracture is seen. IMPRESSION: 1. Staghorncalculus in the right kidney with additional scattered right renal  calculi. Right perinephric fat stranding and abnormal patchy enhancement pattern in the superior pole of the right kidney. There are cystic areas in the inferior pole which may be related to dilated calices. 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 Underlying renal neoplasm not excluded. 2. Prominent retroperitoneal lymph nodes are likely reactive. 3. Sigmoid colon diverticulosis. 4. Aortic atherosclerosis. Aortic Atherosclerosis (ICD10-I70.0). Electronically Signed   By: Greig Pique M.D.   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 Increased urinary frequency with small volumes is noted. There is no recent fever, hospitalization, or current kidney tenderness. Untreated infection poses a risk of systemic infection. Augmentin  has been effective in suppressing infection previously. An urgent referral to urology for surgical evaluation is necessary. Prescribe Augmentin  for 10 days if flank pain, tenderness, or fever occurs. Advise increased fluid intake of at least 4-5 liters per day to prevent stone growth and reduce infection risk. Educate on self-monitoring for kidney tenderness and signs of infection. Prescribe additional antibiotics for immediate use if symptoms worsen. Type 2 diabetes mellitus with hyperglycemia, without long-term current use of insulin  [facility]) She has a history of medication use but is currently not on medication. Past weight loss led to remission, but there has been no recent evaluation of her diabetes status. Order blood work to evaluate diabetes status, including kidney function and a metabolic panel. Suspect well-controlled but Shared decision-making done; patient understood rationale and agreed to labwork  Headache syndrome She reports headache(s)  with chewing, radiating to the top of the head, which began after her second pregnancy. Possible temporomandibular joint involvement is suspected. Advise consultation with a dentist for evaluation of jaw pain.with chewing may be related to a headache syndrome, with pain radiating from the jaw to the top of the head and exacerbated by chewing. Symptoms began after her second pregnancy. Place a referral to neurology for evaluation of headache syndrome. Language barrier affecting health care Interpreter was used. She speaks very little english. Extensive AVS in spanish given  ORDER ASSOCIATIONS  #   DIAGNOSIS / CONDITION ICD-10 ENCOUNTER ORDER     ICD-10-CM   1. Staghorn calculus   N20.0 Ambulatory referral to Urology    2. Pyelonephritis  N12 Ambulatory referral to Urology    3. Recurrent UTI  N39.0 Urinalysis w microscopic + reflex cultur    Urine Culture    amoxicillin -clavulanate (AUGMENTIN ) 875-125 MG tablet    4. Type 2 diabetes mellitus with hyperglycemia, without long-term current use of insulin  (HCC)  E11.65       Meds ordered this encounter  Medications   amoxicillin -clavulanate (AUGMENTIN ) 875-125 MG tablet    Sig: Take 1 tablet by mouth 2 (two) times daily. si se necesita dolor leve en el costado o fiebre    Dispense:  20 tablet    Refill:  0    Ambulatory referral to Urology       Comments: [facility] [place] severe recurrent urinary tract infection (UTI) from staghorn calcululs and chronic right flank pain.  In hospital was advised to have nephrectomy as outpatient.  Request close outpatient management.    Urinalysis w microscopic + reflex cultur         Urine Culture  amoxicillin -clavulanate (AUGMENTIN ) 875-125 MG tablet  2 times daily              This document was synthesized by artificial intelligence ([employer]bridge) using HIPAA-compliant recording of the clinical interaction;   We discussed the use of AI scribe software for clinical note transcription with the patient, who gave verbal consent to proceed. additional Info: This encounter employed state-of-the-art, real-time, collaborative documentation. The patient actively reviewed and assisted in updating their electronic medical record on a shared screen, ensuring transparency and facilitating joint problem-solving for the problem list, overview, and plan. This approach promotes accurate, informed care. The treatment plan was discussed and reviewed in detail, including medication safety, potential side effects, and all patient questions. We confirmed understanding and comfort with the plan. Follow-up instructions were established, including contacting the office for any concerns,  returning if symptoms worsen, persist, or new symptoms develop, and precautions for potential emergency department visits.

## 2024-10-17 NOTE — Assessment & Plan Note (Signed)
 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 Increased urinary frequency with small volumes is noted. There is no recent fever, hospitalization, or current kidney tenderness. Untreated infection poses a risk of systemic infection. Augmentin  has been effective in suppressing infection previously. An urgent referral to urology for surgical evaluation is necessary. Prescribe Augmentin  for 10 days if flank pain, tenderness, or fever occurs. Advise increased fluid intake of at least 4-5 liters per day to prevent stone growth and reduce infection risk. Educate on self-monitoring for kidney tenderness and signs of infection. Prescribe additional antibiotics for immediate use if symptoms worsen.

## 2024-10-17 NOTE — Patient Instructions (Addendum)
 It was a pleasure seeing you today! Your health and satisfaction are our top priorities.  Bernardino Cone, MD  VISIT SUMMARY: Today, you were seen for intermittent flank pain, increased urinary frequency, and jaw pain. We discussed your history of kidney stones and past diabetes management. You were also evaluated for jaw pain that started after your second pregnancy.  YOUR PLAN: -RIGHT STAGHORN RENAL CALCULUS WITH RECURRENT URINARY TRACT INFECTION AND FLANK PAIN: You have a large kidney stone causing intermittent flank pain and recurrent urinary tract infections. This can lead to serious infections if untreated. You should drink 4-5 liters of water daily to help prevent stone growth and reduce infection risk. If you experience flank pain, tenderness, or fever, take Augmentin  for 10 days. We will refer you to a urologist for a surgical evaluation. Monitor yourself for any signs of kidney tenderness or infection, and use additional antibiotics if symptoms worsen.  -TYPE 2 DIABETES MELLITUS, STATUS UNCLEAR: You have a history of type 2 diabetes but are not currently on medication. We need to evaluate your current diabetes status, so we will order blood work to check your kidney function and a metabolic panel.  -JAW PAIN WITH CHEWING, ETIOLOGY UNCLEAR: You have jaw pain when chewing, which may be related to your temporomandibular joint. We recommend seeing a dentist for further evaluation.  -HEADACHE SYNDROME: Your jaw pain may be related to a headache syndrome, with pain radiating from your jaw to the top of your head. We will refer you to a neurologist for further evaluation.  INSTRUCTIONS: Please follow up with the urologist for a surgical evaluation of your kidney stone. Get the blood work done to evaluate your diabetes status. Schedule an appointment with a dentist for your jaw pain and follow up with a neurologist for your headache syndrome.  Your Providers PCP: Cone Bernardino MATSU, MD,   [phone]) Referring Provider: Cone Bernardino MATSU, MD,  [phone])  NEXT STEPS: [x]  Early Intervention: Schedule sooner appointment, call our on-call services, or go to emergency room if there is any significant Increase in pain or discomfort New or worsening symptoms Sudden or severe changes in your health [x]  Flexible Follow-Up: We recommend a Return in about 2 months (around 12/17/2024). for optimal routine care. This allows for progress monitoring and treatment adjustments. [x]  Preventive Care: Schedule your annual preventive care visit! It's typically covered by insurance and helps identify potential health issues early. [x]  Lab & X-ray Appointments: Incomplete tests scheduled today, or call to schedule. X-rays: [facility] (M-F, 8:30am-noon or 1pm-5pm). [x]  Medical Information Release: Sign a release form at front desk to obtain relevant medical information we don't have.  MAKING THE MOST OF OUR FOCUSED 20 MINUTE APPOINTMENTS: [x]   Clearly state your top concerns at the beginning of the visit to focus our discussion [x]   If you anticipate you will need more time, please inform the front desk during scheduling - we can book multiple appointments in the same week. [x]   If you have transportation problems- use our convenient video appointments or ask about transportation support. [x]   We can get down to business faster if you use MyChart to update information before the visit and submit non-urgent questions before your visit. Thank you for taking the time to provide details through MyChart.  Let our nurse know and she can import this information into your encounter documents.  Arrival and Wait Times: [x]   Arriving on time ensures that everyone receives prompt attention. [x]   Early morning (8a)  and afternoon (1p) appointments tend to have shortest wait times. [x]   Unfortunately, we cannot delay appointments for late arrivals or hold slots during phone calls.  Getting  Answers and Following Up [x]   Simple Questions & Concerns: For quick questions or basic follow-up after your visit, reach us  at [phone] or MyChart messaging. [x]   Complex Concerns: If your concern is more complex, scheduling an appointment might be best. Discuss this with the staff to find the most suitable option. [x]   Lab & Imaging Results: We'll contact you directly if results are abnormal or you don't use MyChart. Most normal results will be on MyChart within 2-3 business days, with a review message from Dr. Jesus. Haven't heard back in 2 weeks? Need results sooner? Contact us  at [phone]. [x]   Referrals: Our referral coordinator will manage specialist referrals. The specialist's office should contact you within 2 weeks to schedule an appointment. Call us  if you haven't heard from them after 2 weeks.  Staying Connected [x]   MyChart: Activate your MyChart for the fastest way to access results and message us . See the last page of this paperwork for instructions on how to activate.  Bring to Your Next Appointment [x]   Medications: Please bring all your medication bottles to your next appointment to ensure we have an accurate record of your prescriptions. [x]   Health Diaries: If you're monitoring any health conditions at home, keeping a diary of your readings can be very helpful for discussions at your next appointment.  Billing [x]   X-ray & Lab Orders: These are billed by separate companies. Contact the invoicing company directly for questions or concerns. [x]   Visit Charges: Discuss any billing inquiries with our administrative services team.  Your Satisfaction Matters [x]   Share Your Experience: We strive for your satisfaction! If you have any complaints, or preferably compliments, please let Dr. Jesus know directly or contact our Practice Administrators, Manuelita Rubin or [employer], by asking at the front desk.   Reviewing Your Records [x]   Review this early draft of  your clinical encounter notes below and the final encounter summary tomorrow on MyChart after its been completed.  All orders placed so far are visible here: Staghorn calculus -     Ambulatory referral to Urology -     Protein / creatinine ratio, urine  Recurrent UTI -     Urinalysis w microscopic + reflex cultur -     Urine Culture -     Amoxicillin -Pot Clavulanate; Take 1 tablet by mouth 2 (two) times daily. si se necesita dolor leve en el costado o fiebre  Dispense: 20 tablet; Refill: 0 -     Protein / creatinine ratio, urine  Type 2 diabetes mellitus with hyperglycemia, without long-term current use of insulin  (HCC) -     Hemoglobin A1c -     CBC with Differential/Platelet -     Comprehensive metabolic panel with GFR -     Protein / creatinine ratio, urine -     Lipid panel  Headache syndrome -     Ambulatory referral to Neurology

## 2024-10-17 NOTE — Assessment & Plan Note (Signed)
 Interpreter was used. She speaks very little english. Extensive AVS in spanish given

## 2024-10-18 ENCOUNTER — Encounter: Payer: Self-pay | Admitting: Neurology

## 2024-10-18 LAB — PROTEIN / CREATININE RATIO, URINE
Creatinine, Urine: 73 mg/dL (ref 20–275)
Protein/Creat Ratio: 164 mg/g{creat} (ref 24–184)
Protein/Creatinine Ratio: 0.164 mg/mg{creat} (ref 0.024–0.184)
Total Protein, Urine: 12 mg/dL (ref 5–24)

## 2024-10-18 LAB — URINALYSIS, COMPLETE
Bacteria, UA: NONE SEEN /HPF
Bilirubin Urine: NEGATIVE
Glucose, UA: NEGATIVE
Ketones, ur: NEGATIVE
Nitrite: NEGATIVE
Protein, ur: NEGATIVE
RBC / HPF: NONE SEEN /HPF (ref 0–2)
Specific Gravity, Urine: 1.01 (ref 1.001–1.035)
Squamous Epithelial / HPF: NONE SEEN /HPF (ref <=5)
pH: 6 (ref 5.0–8.0)

## 2024-10-18 LAB — URINE CULTURE
MICRO NUMBER:: 17182105
Result:: NO GROWTH
SPECIMEN QUALITY:: ADEQUATE

## 2024-10-23 ENCOUNTER — Ambulatory Visit: Payer: Self-pay | Admitting: Internal Medicine

## 2024-10-24 NOTE — Telephone Encounter (Signed)
 read by Belicia I Zambrano-Calderon at 12:09AM on 10/24/2024.

## 2024-10-25 ENCOUNTER — Other Ambulatory Visit: Payer: Self-pay

## 2024-10-26 ENCOUNTER — Other Ambulatory Visit: Payer: Self-pay

## 2024-10-31 ENCOUNTER — Ambulatory Visit: Payer: Self-pay | Admitting: Nurse Practitioner

## 2024-11-07 ENCOUNTER — Ambulatory Visit: Admitting: Internal Medicine

## 2024-12-20 ENCOUNTER — Ambulatory Visit: Admitting: Internal Medicine

## 2025-01-09 ENCOUNTER — Ambulatory Visit: Admitting: Internal Medicine

## 2025-01-26 ENCOUNTER — Ambulatory Visit: Admitting: Internal Medicine

## 2025-02-07 ENCOUNTER — Ambulatory Visit: Admitting: Neurology
